# Patient Record
Sex: Female | Born: 1985 | Race: White | Hispanic: No | Marital: Married | Smoking: Current some day smoker
Health system: Southern US, Community
[De-identification: ages and names within clinical notes are randomized; demographics above are authoritative.]

## PROBLEM LIST (undated history)

## (undated) ENCOUNTER — Inpatient Hospital Stay (HOSPITAL_COMMUNITY): Payer: Self-pay

## (undated) DIAGNOSIS — F32A Depression, unspecified: Secondary | ICD-10-CM

## (undated) DIAGNOSIS — F329 Major depressive disorder, single episode, unspecified: Secondary | ICD-10-CM

## (undated) DIAGNOSIS — R519 Headache, unspecified: Secondary | ICD-10-CM

## (undated) DIAGNOSIS — G4730 Sleep apnea, unspecified: Secondary | ICD-10-CM

## (undated) DIAGNOSIS — M1990 Unspecified osteoarthritis, unspecified site: Secondary | ICD-10-CM

## (undated) DIAGNOSIS — N289 Disorder of kidney and ureter, unspecified: Secondary | ICD-10-CM

## (undated) DIAGNOSIS — J45909 Unspecified asthma, uncomplicated: Secondary | ICD-10-CM

## (undated) DIAGNOSIS — O24419 Gestational diabetes mellitus in pregnancy, unspecified control: Secondary | ICD-10-CM

## (undated) DIAGNOSIS — F419 Anxiety disorder, unspecified: Secondary | ICD-10-CM

## (undated) DIAGNOSIS — N390 Urinary tract infection, site not specified: Secondary | ICD-10-CM

## (undated) HISTORY — PX: ELBOW SURGERY: SHX618

## (undated) HISTORY — DX: Gestational diabetes mellitus in pregnancy, unspecified control: O24.419

## (undated) HISTORY — PX: TUBAL LIGATION: SHX77

## (undated) HISTORY — PX: CHOLECYSTECTOMY: SHX55

---
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
## 2007-01-24 ENCOUNTER — Inpatient Hospital Stay (HOSPITAL_COMMUNITY): Admission: AD | Admit: 2007-01-24 | Discharge: 2007-01-24 | Payer: Self-pay | Admitting: Obstetrics and Gynecology

## 2007-01-30 ENCOUNTER — Ambulatory Visit: Payer: Self-pay | Admitting: *Deleted

## 2007-01-30 ENCOUNTER — Inpatient Hospital Stay (HOSPITAL_COMMUNITY): Admission: AD | Admit: 2007-01-30 | Discharge: 2007-01-30 | Payer: Self-pay | Admitting: Obstetrics and Gynecology

## 2007-02-25 ENCOUNTER — Ambulatory Visit: Payer: Self-pay | Admitting: Gynecology

## 2007-02-25 ENCOUNTER — Inpatient Hospital Stay (HOSPITAL_COMMUNITY): Admission: AD | Admit: 2007-02-25 | Discharge: 2007-02-25 | Payer: Self-pay | Admitting: Gynecology

## 2007-02-26 ENCOUNTER — Inpatient Hospital Stay (HOSPITAL_COMMUNITY): Admission: AD | Admit: 2007-02-26 | Discharge: 2007-02-27 | Payer: Self-pay | Admitting: Obstetrics and Gynecology

## 2007-02-26 ENCOUNTER — Ambulatory Visit: Payer: Self-pay | Admitting: *Deleted

## 2009-01-30 ENCOUNTER — Emergency Department (HOSPITAL_COMMUNITY): Admission: EM | Admit: 2009-01-30 | Discharge: 2009-01-30 | Payer: Self-pay | Admitting: Emergency Medicine

## 2010-04-17 ENCOUNTER — Emergency Department (HOSPITAL_COMMUNITY): Admission: EM | Admit: 2010-04-17 | Discharge: 2010-04-17 | Payer: Self-pay | Admitting: Emergency Medicine

## 2010-08-01 ENCOUNTER — Encounter: Payer: Self-pay | Admitting: Orthopaedic Surgery

## 2010-10-17 LAB — URINALYSIS, ROUTINE W REFLEX MICROSCOPIC
Bilirubin Urine: NEGATIVE
Glucose, UA: NEGATIVE mg/dL
Hgb urine dipstick: NEGATIVE
Ketones, ur: NEGATIVE mg/dL
Nitrite: NEGATIVE
Protein, ur: NEGATIVE mg/dL
Specific Gravity, Urine: 1.01 (ref 1.005–1.030)
Urobilinogen, UA: 0.2 mg/dL (ref 0.0–1.0)
pH: 7 (ref 5.0–8.0)

## 2010-10-17 LAB — PREGNANCY, URINE: Preg Test, Ur: NEGATIVE

## 2010-10-17 LAB — GLUCOSE, CAPILLARY: Glucose-Capillary: 93 mg/dL (ref 70–99)

## 2010-11-24 ENCOUNTER — Emergency Department (HOSPITAL_COMMUNITY): Payer: PRIVATE HEALTH INSURANCE

## 2010-11-24 ENCOUNTER — Emergency Department (HOSPITAL_COMMUNITY)
Admission: EM | Admit: 2010-11-24 | Discharge: 2010-11-24 | Disposition: A | Discharge status: Home / Self Care | Payer: PRIVATE HEALTH INSURANCE | Attending: Emergency Medicine | Admitting: Emergency Medicine

## 2010-11-24 DIAGNOSIS — R51 Headache: Secondary | ICD-10-CM | POA: Insufficient documentation

## 2010-11-24 DIAGNOSIS — J45909 Unspecified asthma, uncomplicated: Secondary | ICD-10-CM | POA: Insufficient documentation

## 2010-11-26 NOTE — Op Note (Signed)
   NAME:  [name], [name]                       ACCOUNT NO.:  [id number]   MEDICAL RECORD NO.:  [id number]                   PATIENT TYPE:  INP   LOCATION:  A418                                 FACILITY:  [facility]   PHYSICIAN:  Lazaro Arms, M.D.                DATE OF BIRTH:  16-Feb-1986   DATE OF PROCEDURE:  DATE OF DISCHARGE:                                 OPERATIVE REPORT   EPIDURAL NOTE   The patient is a 25 year old gravida 1 in active phase of labor, 4 cm, who  has had a dose of Nubain and Phenergan who is requesting an epidural  placement for pain management.   DESCRIPTION OF PROCEDURE:  The patient is placed in the sitting position.  The L2-L3 interspace identified.  The area is prepped and draped.  Lidocaine  1% is used as a local anesthetic. A 17-gauge Touhy needle was placed into  the epidural space with 1 pass without difficulty.  Ten cc of 0.125%  bupivacaine plain was given as a test dose without ill effect. The epidural  catheter was then fitting into the epidural space easily.  It was taped down  5 cm into the space, 12.5 cm at the skin.  An addition 10 cc of 0.125%  bupivacaine was given and the patient had no ill effects, became comfortable  with a reactive NST.  A continuous pump of 0.125% bupivacaine with 2 mcg/cc  of Fentanyl was placed at 12 cc an hour.  Blood pressure was stable. No ill  effects.      ___________________________________________                                            Lazaro Arms, M.D.   LHE/MEDQ  D:  05/17/2003  T:  05/18/2003  Job:  [id number]

## 2010-11-26 NOTE — Discharge Summary (Signed)
NAME:  Brooke Espinoza, Brooke Espinoza                       ACCOUNT NO.:  [id number]   MEDICAL RECORD NO.:  [id number]                   PATIENT TYPE:  OIB   LOCATION:  A415                                 FACILITY:  [facility]   PHYSICIAN:  Langley Gauss, M.D.                DATE OF BIRTH:  1985-11-28   DATE OF ADMISSION:  03/23/2003  DATE OF DISCHARGE:  03/23/2003                                 DISCHARGE SUMMARY   HISTORY OF PRESENT ILLNESS:  The patient is a 25 year old, G1, P0 at 29-2/7  weeks' gestation who presents to [facility] complaining of uterine  contractions since 1330 hours today.  The patient states that she has had  multiple previous episodes of Braxton-Hicks contractions.  These were  different in that she felt they were stronger and more frequent than  typical.  The patient's prenatal course is otherwise uncomplicated.   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   PAST MEDICAL HISTORY:  History of asthma for which she uses an albuterol  inhaler on a p.r.n. basis.   REVIEW OF SYMPTOMS:  She denies any change in cervical mucus, any vaginal  bleeding or leakage of fluid.   PRENATAL COURSE:  Prenatal care provided at [facility].   PHYSICAL EXAMINATION:  GENERAL:  In no acute distress.  VITAL SIGNS:  Temperature 97.9, pulse 82, respirations 16, blood pressure  130/68.  ABDOMEN:  Uterus soft, nontender.  Fundal height 30 cm.  Vertex presentation  by Leopold's maneuvers.  PELVIC:  Normal external genitalia.  No lesions or ulcerations identified.  Cervix noted to be closed.  Presenting part is not engaged.  Cervix is firm  and 3 cm long.  External fetal monitoring reveals reassuring fetal heart  rate with baseline 150.  No decelerations noted.   HOSPITAL COURSE:  On initial monitor strip, there appears to be very mild  uterine activity occurring every five minutes.  For this reason, the patient  is treated with a single injection of  0.25 mg of subcutaneous terbutaline  with complete cessation of uterine activity for 1-1/2 hours duration.  I  reevaluated the patient at that time.  The cervix was noted to be closed and  she was advised that if the uterine contractions did not recur, she would be  discharged to home.  Nursing staff reports that right after I left the room,  she again had renewed onset of these somewhat atypical appearing uterine  contractions every five minutes, very mild in intensity.  She was treated  with a second 0.25 mg subcutaneous terbutaline.  Shortly thereafter, the  patient states that she was hungry and desires to eat.  Her boyfriend had  already left to get her some food.  The patient thereafter ate in the room  with no recurrence of uterine activity.  She was treated with 2.5 mg of p.o.  terbutaline.  With no renewed uterine activity and cervix noted to be  closed, the  patient was discharged to home.  She is given 2.5 mg of subcutaneous  terbutaline to take with her to take in the morning.  A prescription is  written for terbutaline 2.5 mg p.o. q.6h. while awake.  In addition, on  tonight's date of visit she is given Ambien 10 mg p.o. x1 for therapeutic  rest.                                               Langley Gauss, M.D.    DC/MEDQ  D:  03/24/2003  T:  03/24/2003  Job:  [id number]   cc:   [facility]

## 2010-11-26 NOTE — Discharge Summary (Signed)
   NAME:  Brooke Espinoza, Brooke Espinoza                       ACCOUNT NO.:  [id number]   MEDICAL RECORD NO.:  [id number]                   PATIENT TYPE:  INP   LOCATION:  A418                                 FACILITY:  [facility]   PHYSICIAN:  Lazaro Arms, M.D.                DATE OF BIRTH:  [birth year]   DATE OF ADMISSION:  05/16/2003  DATE OF DISCHARGE:                                 DISCHARGE SUMMARY   HISTORY OF PRESENT ILLNESS:  Brooke Espinoza is a 25 year old white female, gravida  1, para 0, estimated date of delivery of June 07, 2003, at [redacted] weeks  gestation, who has been in prodromal labor for the last several days.  She  has finally progressed at 3-4 cm, 75% effaced, and a 1- station vertex.  Her  pregnancy has been uncomplicated.  She had one asthma attack.  She has a  history of asthma.  There is a reactive NST and the patient is contracting  every two to three minutes.   PAST MEDICAL HISTORY:  Asthma.   PAST SURGICAL HISTORY:  Negative.   PAST OBSTETRICAL HISTORY:  She is nulliparous.   ALLERGIES:  None.   MEDICATIONS:  Albuterol as needed.   SOCIAL HISTORY:  She is single and the father of the baby is supportive.   REVIEW OF SYSTEMS:  Otherwise negative.   LABORATORY DATA:  Blood type is A positive.  The antibody screen was  negative.  Serology was nonreactive.  Rubella was immune.  HIV was negative.  Hepatitis B was negative.  Pap was class I.  GC and chlamydia were negative.  AFP was normal.  Group B Streptococcus was negative.  Glucola was 128.   PHYSICAL EXAMINATION:  HEENT:  Unremarkable.  NECK:  The thyroid is normal.  LUNGS:  Clear.  HEART:  Regular rate and rhythm without murmur, rub, or gallop.  BREASTS:  Without mass, discharge, or skin changes.  ABDOMEN:  Fundal height of 39 cm.  PELVIC:  The cervix is 3-4, 75, and -1 station.  EXTREMITIES:  Warm with 1+ edema.    IMPRESSION:  1. Intrauterine pregnancy at [redacted] weeks gestation.  2. Early active labor after long  prodromal phase.   PLAN:  The patient is admitted for expectant management of normal  spontaneous vaginal delivery.     ___________________________________________                                         Lazaro Arms, M.D.   LHE/MEDQ  D:  05/20/2003  T:  05/20/2003  Job:  [id number]

## 2010-11-26 NOTE — Op Note (Signed)
   NAME:  Brooke Espinoza, Brooke Espinoza                       ACCOUNT NO.:  [id number]   MEDICAL RECORD NO.:  [id number]                   PATIENT TYPE:  INP   LOCATION:  A418                                 FACILITY:  [facility]   PHYSICIAN:  Lazaro Arms, M.D.                DATE OF BIRTH:  1986/04/07   DATE OF PROCEDURE:  DATE OF DISCHARGE:                                 OPERATIVE REPORT   DELIVERY NOTE   [name] is a 25 year old gravida 1 para 0 with a working epidural.  She was  found to be complete.  She began maternal expulsive efforts at approximately  2245 and over an intact perineum delivered a viable female infant at 2304 with  Apgars of 8 and 9, weighing 9 pounds and 1 ounces.  There is a 3-vessel  cord.  Cord blood and cord gas were sent.  The placenta was delivered and it  was intact. The uterus was firm.  Blood loss for the delivery was about 300  cc.   There was a first-degree laceration of the perineum that was repaired with  three 3-0 Monocryl sutures in the usual fashion without difficulty.  The  infant underwent routine neonatal resuscitation by 2 labor and delivery  nursery delivery nurses who were in attendance for the delivery.  The  patient tolerated the procedure well.  The epidural catheter was removed  intact.  She will undergo routine postpartum care.      ___________________________________________                                            Lazaro Arms, M.D.   LHE/MEDQ  D:  05/17/2003  T:  05/18/2003  Job:  [id number]

## 2011-04-22 LAB — CCBB MATERNAL DONOR DRAW

## 2011-04-22 LAB — CBC
HCT: 29.7 — ABNORMAL LOW
Hemoglobin: 10 — ABNORMAL LOW
Hemoglobin: 12.6
RBC: 3.72 — ABNORMAL LOW
RBC: 4.67
WBC: 11.5 — ABNORMAL HIGH

## 2011-11-29 ENCOUNTER — Ambulatory Visit: Payer: 59 | Attending: Orthopedic Surgery | Admitting: Physical Therapy

## 2011-11-29 DIAGNOSIS — R5381 Other malaise: Secondary | ICD-10-CM | POA: Insufficient documentation

## 2011-11-29 DIAGNOSIS — M25619 Stiffness of unspecified shoulder, not elsewhere classified: Secondary | ICD-10-CM | POA: Insufficient documentation

## 2011-11-29 DIAGNOSIS — IMO0001 Reserved for inherently not codable concepts without codable children: Secondary | ICD-10-CM | POA: Insufficient documentation

## 2011-11-29 DIAGNOSIS — M25519 Pain in unspecified shoulder: Secondary | ICD-10-CM | POA: Insufficient documentation

## 2011-12-01 ENCOUNTER — Encounter: Payer: 59 | Admitting: Physical Therapy

## 2011-12-02 ENCOUNTER — Ambulatory Visit: Payer: 59 | Admitting: Physical Therapy

## 2011-12-06 ENCOUNTER — Encounter: Payer: 59 | Admitting: Physical Therapy

## 2011-12-07 ENCOUNTER — Encounter: Payer: 59 | Admitting: Physical Therapy

## 2011-12-08 ENCOUNTER — Ambulatory Visit: Payer: 59 | Admitting: Physical Therapy

## 2011-12-13 ENCOUNTER — Encounter: Payer: 59 | Admitting: Physical Therapy

## 2011-12-16 ENCOUNTER — Encounter: Payer: 59 | Admitting: *Deleted

## 2012-04-05 LAB — OB RESULTS CONSOLE HGB/HCT, BLOOD: HCT: 40 %

## 2012-04-05 LAB — OB RESULTS CONSOLE ABO/RH: RH Type: POSITIVE

## 2012-04-05 LAB — OB RESULTS CONSOLE RUBELLA ANTIBODY, IGM: Rubella: IMMUNE

## 2012-04-05 LAB — OB RESULTS CONSOLE HIV ANTIBODY (ROUTINE TESTING): HIV: NONREACTIVE

## 2012-04-30 ENCOUNTER — Other Ambulatory Visit (HOSPITAL_COMMUNITY)
Admission: RE | Admit: 2012-04-30 | Discharge: 2012-04-30 | Disposition: A | Discharge status: Home / Self Care | Payer: PRIVATE HEALTH INSURANCE | Source: Ambulatory Visit | Attending: Obstetrics and Gynecology | Admitting: Obstetrics and Gynecology

## 2012-04-30 DIAGNOSIS — Z113 Encounter for screening for infections with a predominantly sexual mode of transmission: Secondary | ICD-10-CM | POA: Insufficient documentation

## 2012-04-30 DIAGNOSIS — Z01419 Encounter for gynecological examination (general) (routine) without abnormal findings: Secondary | ICD-10-CM | POA: Insufficient documentation

## 2012-04-30 LAB — OB RESULTS CONSOLE GC/CHLAMYDIA: Gonorrhea: NEGATIVE

## 2012-05-31 ENCOUNTER — Encounter (HOSPITAL_COMMUNITY): Payer: Self-pay | Admitting: *Deleted

## 2012-05-31 ENCOUNTER — Emergency Department (HOSPITAL_COMMUNITY)
Admission: EM | Admit: 2012-05-31 | Discharge: 2012-05-31 | Discharge status: Against Medical Advice | Payer: PRIVATE HEALTH INSURANCE | Attending: Emergency Medicine | Admitting: Emergency Medicine

## 2012-05-31 DIAGNOSIS — B349 Viral infection, unspecified: Secondary | ICD-10-CM

## 2012-05-31 DIAGNOSIS — R059 Cough, unspecified: Secondary | ICD-10-CM | POA: Insufficient documentation

## 2012-05-31 DIAGNOSIS — B338 Other specified viral diseases: Secondary | ICD-10-CM | POA: Insufficient documentation

## 2012-05-31 DIAGNOSIS — R05 Cough: Secondary | ICD-10-CM | POA: Insufficient documentation

## 2012-05-31 DIAGNOSIS — O98519 Other viral diseases complicating pregnancy, unspecified trimester: Secondary | ICD-10-CM | POA: Insufficient documentation

## 2012-05-31 DIAGNOSIS — J029 Acute pharyngitis, unspecified: Secondary | ICD-10-CM | POA: Insufficient documentation

## 2012-05-31 DIAGNOSIS — R509 Fever, unspecified: Secondary | ICD-10-CM | POA: Insufficient documentation

## 2012-05-31 MED ORDER — ONDANSETRON 8 MG PO TBDP
8.0000 mg | ORAL_TABLET | Freq: Once | ORAL | Status: AC
  Administered 2012-05-31: 8 mg via ORAL
  Filled 2012-05-31: qty 1

## 2012-05-31 MED ORDER — ALBUTEROL SULFATE HFA 108 (90 BASE) MCG/ACT IN AERS
2.0000 | INHALATION_SPRAY | RESPIRATORY_TRACT | Status: DC | PRN
  Administered 2012-05-31: 2 via RESPIRATORY_TRACT
  Filled 2012-05-31: qty 6.7

## 2012-05-31 MED ORDER — ACETAMINOPHEN 325 MG PO TABS
650.0000 mg | ORAL_TABLET | Freq: Once | ORAL | Status: AC
  Administered 2012-05-31: 650 mg via ORAL
  Filled 2012-05-31: qty 2

## 2012-05-31 NOTE — ED Notes (Addendum)
Pt presents with productive cough, green sputum, chills, body aches, Nausea and diarrhea x 1 week per pt. Pt reports having a flu shot in October.  Pt denies emesis at this time. Pt is afebrile at this time. Denies abdominal pain. Pt states is [redacted] weeks pregnant.

## 2012-05-31 NOTE — ED Provider Notes (Signed)
History     CSN: [id number]  Arrival date & time 05/31/12  1318   First MD Initiated Contact with Patient 05/31/12 1324      Chief Complaint  Patient presents with  . Cough  . Fever    Patient is a 26 y.o. female presenting with cough. The history is provided by the patient.  Cough This is a new problem. The current episode started more than 2 days ago. The problem occurs every few minutes. The problem has been gradually worsening. The cough is productive of sputum. Associated symptoms include chills and sore throat. She has tried decongestants for the symptoms. The treatment provided no relief. She is not a smoker.   Pt presents for cough, congestion, sore throat for past 7 days.  She reports when she takes deep breath she has coughing.  She reports greenish sputum, but no hemoptysis reported.  She also reports myalgias.  She reports chest wall pain with cough . She reports she feels SOB with deep breathing.  No vomiting but she reports diarrhea and loose stool (nonbloody) for past several days.  No abd pain.  No vag bleeding. She is currently [redacted] weeks pregnant, she has had prenatal care and no complications thus far. Past Medical History  Diagnosis Date  . Pregnant     History reviewed. No pertinent past surgical history.  History reviewed. No pertinent family history.  History  Substance Use Topics  . Smoking status: Never Smoker   . Smokeless tobacco: Not on file  . Alcohol Use: No    OB History    Grav Para Term Preterm Abortions TAB SAB Ect Mult Living   1               Review of Systems  Constitutional: Positive for chills.  HENT: Positive for sore throat.   All other systems reviewed and are negative.    Allergies  Review of patient's allergies indicates no known allergies.  Home Medications  No current outpatient prescriptions on file.  BP 126/76  Pulse 85  Temp 98 F (36.7 C) (Oral)  Resp 20  Ht 5' 6.5" (1.689 m)  Wt 218 lb (98.884 kg)  BMI  34.66 kg/m2  SpO2 99%  Physical Exam CONSTITUTIONAL: Well developed/well nourished HEAD AND FACE: Normocephalic/atraumatic EYES: EOMI/PERRL ENMT: Mucous membranes moist, nasal congestion, uvula midline, pharynx normal NECK: supple no meningeal signs SPINE:entire spine nontender CV: S1/S2 noted, no murmurs/rubs/gallops noted LUNGS: Lungs are clear to auscultation bilaterally, no apparent distress Chest - tender to palpation, no crepitance noted ABDOMEN: soft, nontender, no rebound or guarding GU:no cva tenderness NEURO: Pt is awake/alert, moves all extremitiesx4 EXTREMITIES: pulses normal, full ROM SKIN: warm, color normal PSYCH: no abnormalities of mood noted  ED Course  Procedures   1:55 PM Pt here with cough/congestion and diarrhea.  Likely viral syndrome.  Do not feel imaging warranted as lung sounds clear, afebrile and no hypoxia.  She walks around the ED in no distress.  I did offer tylenol/zofran and a dose of albuterol for her cough.  She will trial this here.  She does not want IV or any lab draw  MDM  Nursing notes including past medical history and social history reviewed and considered in documentation   Pt left before final disposition could be set       Joya Gaskins, MD 05/31/12 1455

## 2012-05-31 NOTE — ED Notes (Signed)
Pt cough, nasal congestion and fever x 1 week, cough productive at times and green in color

## 2012-05-31 NOTE — ED Notes (Signed)
Pt states that she is almost [redacted] weeks pregnant

## 2012-06-11 ENCOUNTER — Inpatient Hospital Stay (HOSPITAL_COMMUNITY)
Admission: AD | Admit: 2012-06-11 | Discharge: 2012-06-11 | Disposition: A | Discharge status: Home / Self Care | Payer: PRIVATE HEALTH INSURANCE | Source: Ambulatory Visit | Attending: Obstetrics & Gynecology | Admitting: Obstetrics & Gynecology

## 2012-06-11 ENCOUNTER — Encounter (HOSPITAL_COMMUNITY): Payer: Self-pay

## 2012-06-11 DIAGNOSIS — R109 Unspecified abdominal pain: Secondary | ICD-10-CM

## 2012-06-11 DIAGNOSIS — O26899 Other specified pregnancy related conditions, unspecified trimester: Secondary | ICD-10-CM

## 2012-06-11 DIAGNOSIS — O99891 Other specified diseases and conditions complicating pregnancy: Secondary | ICD-10-CM | POA: Insufficient documentation

## 2012-06-11 LAB — URINE MICROSCOPIC-ADD ON

## 2012-06-11 LAB — URINALYSIS, ROUTINE W REFLEX MICROSCOPIC
Bilirubin Urine: NEGATIVE
Glucose, UA: NEGATIVE mg/dL
Hgb urine dipstick: NEGATIVE
Nitrite: NEGATIVE
Specific Gravity, Urine: 1.02 (ref 1.005–1.030)
pH: 6.5 (ref 5.0–8.0)

## 2012-06-11 LAB — WET PREP, GENITAL: Yeast Wet Prep HPF POC: NONE SEEN

## 2012-06-11 NOTE — MAU Note (Signed)
Patient states she has been having abdominal cramping today and passed a clot at 1630. No active bleeding. States some nausea with the cramping.

## 2012-06-11 NOTE — MAU Provider Note (Signed)
History     CSN: [id number]  Arrival date and time: 06/11/12 1754   None     Chief Complaint  Patient presents with  . Abdominal Pain  . Vaginal Bleeding   HPI 26 y.o. [id number] at [redacted]w[redacted]d with c/o contraction type pain starting today, states pains are about 3-5 minutes apart, last about 1 min each and "feel like everything is pushing down". She states she passed a blood clot today, no other discharge or bleeding. Prenatal care at [facility], states she had an ultrasound and that "everything is where it's supposed to be". Pt states blood type is A pos.   Past Medical History  Diagnosis Date  . Pregnant     History reviewed. No pertinent past surgical history.  History reviewed. No pertinent family history.  History  Substance Use Topics  . Smoking status: Never Smoker   . Smokeless tobacco: Not on file  . Alcohol Use: No    Allergies: No Known Allergies  Prescriptions prior to admission  Medication Sig Dispense Refill  . flintstones complete (FLINTSTONES) 60 MG chewable tablet Chew 2 tablets by mouth daily.      Marland Kitchen KETOCONAZOLE-CLEANSER EX Apply 1 application topically daily.        Review of Systems  Constitutional: Negative.   Respiratory: Negative.   Cardiovascular: Negative.   Gastrointestinal: Positive for abdominal pain. Negative for nausea, vomiting, diarrhea and constipation.  Genitourinary: Negative for dysuria, urgency, frequency, hematuria and flank pain.       Positive for vaginal bleeding   Musculoskeletal: Negative.   Neurological: Negative.   Psychiatric/Behavioral: Negative.    Physical Exam   Blood pressure 78/49, pulse 90, temperature 97.6 F (36.4 C), temperature source Oral, resp. rate 20, height 5\' 6"  (1.676 m), weight 218 lb 3.2 oz (98.975 kg), SpO2 97.00%.  Physical Exam  Nursing note and vitals reviewed. Constitutional: She is oriented to person, place, and time. She appears well-developed and well-nourished. No distress.  HENT:    Head: Normocephalic and atraumatic.  Cardiovascular: Normal rate and regular rhythm.   Respiratory: Effort normal. No respiratory distress.  GI: Soft. She exhibits no distension and no mass. There is no tenderness. There is no rebound and no guarding.  Genitourinary: There is no rash or lesion on the right labia. There is no rash or lesion on the left labia. Uterus is not tender. Cervix exhibits no motion tenderness, no discharge and no friability. No erythema, tenderness or bleeding (no evidence of vaginal bleeding) around the vagina. Vaginal discharge (creamy white) found.       SVE: closed/thick/high  Neurological: She is alert and oriented to person, place, and time.  Skin: Skin is warm and dry.  Psychiatric: She has a normal mood and affect.   + FHR 150s MAU Course  Procedures  Results for orders placed during the hospital encounter of 06/11/12 (from the past 72 hour(s))  URINALYSIS, ROUTINE W REFLEX MICROSCOPIC     Status: Abnormal   Collection Time   06/11/12  6:15 PM      Component Value Range Comment   Color, Urine YELLOW  YELLOW    APPearance CLEAR  CLEAR    Specific Gravity, Urine 1.020  1.005 - 1.030    pH 6.5  5.0 - 8.0    Glucose, UA NEGATIVE  NEGATIVE mg/dL    Hgb urine dipstick NEGATIVE  NEGATIVE    Bilirubin Urine NEGATIVE  NEGATIVE    Ketones, ur NEGATIVE  NEGATIVE mg/dL  Protein, ur NEGATIVE  NEGATIVE mg/dL    Urobilinogen, UA 0.2  0.0 - 1.0 mg/dL    Nitrite NEGATIVE  NEGATIVE    Leukocytes, UA SMALL (*) NEGATIVE   URINE MICROSCOPIC-ADD ON     Status: Abnormal   Collection Time   06/11/12  6:15 PM      Component Value Range Comment   Squamous Epithelial / LPF FEW (*) RARE    WBC, UA 3-6  <3 WBC/hpf    Bacteria, UA RARE  RARE   WET PREP, GENITAL     Status: Abnormal   Collection Time   06/11/12  6:50 PM      Component Value Range Comment   Yeast Wet Prep HPF POC NONE SEEN  NONE SEEN    Trich, Wet Prep NONE SEEN  NONE SEEN    Clue Cells Wet Prep HPF POC  NONE SEEN  NONE SEEN    WBC, Wet Prep HPF POC MANY (*) NONE SEEN MANY BACTERIA SEEN     Assessment and Plan   1. Abdominal pain in pregnancy   No evidence of bleeding at this time, cervix closed, urine culture sent, but UA shows small leuk only, will treat for UTI if indicated by culture.     Medication List     As of 06/11/2012  7:23 PM    CONTINUE taking these medications         flintstones complete 60 MG chewable tablet      KETOCONAZOLE-CLEANSER EX         Follow-up Information    Follow up with [facility]. (as scheduled or sooner as needed)    Contact information:   [address] [place] [address] [phone]           FRAZIER,NATALIE 06/11/2012, 7:23 PM

## 2012-06-12 LAB — GC/CHLAMYDIA PROBE AMP: GC Probe RNA: NEGATIVE

## 2012-07-11 NOTE — L&D Delivery Note (Signed)
Delivery Note At 9:20 PM a viable female was delivered via Vaginal, Spontaneous Delivery (Presentation: ;  ).  APGAR: 9, 9; weight .   Placenta status: Intact, Spontaneous.  Cord: 3 vessels with the following complications: tight nuchal cord x2 clamped and cut on perineum.   Anesthesia: Epidural  Episiotomy: None Lacerations: None Suture Repair: n/a Est. Blood Loss (mL): 400cc  Mom to postpartum.  Baby to nursery-stable.  HARRAWAY-SMITH, Gabor Lusk 11/24/2012, 9:39 PM

## 2012-09-03 ENCOUNTER — Encounter: Payer: Self-pay | Admitting: *Deleted

## 2012-09-05 ENCOUNTER — Inpatient Hospital Stay (HOSPITAL_COMMUNITY)
Admission: AD | Admit: 2012-09-05 | Discharge: 2012-09-05 | Disposition: A | Discharge status: Home / Self Care | Payer: PRIVATE HEALTH INSURANCE | Source: Ambulatory Visit | Attending: Obstetrics & Gynecology | Admitting: Obstetrics & Gynecology

## 2012-09-05 ENCOUNTER — Encounter (HOSPITAL_COMMUNITY): Payer: Self-pay

## 2012-09-05 DIAGNOSIS — R42 Dizziness and giddiness: Secondary | ICD-10-CM | POA: Insufficient documentation

## 2012-09-05 DIAGNOSIS — R1011 Right upper quadrant pain: Secondary | ICD-10-CM | POA: Insufficient documentation

## 2012-09-05 DIAGNOSIS — O169 Unspecified maternal hypertension, unspecified trimester: Secondary | ICD-10-CM

## 2012-09-05 DIAGNOSIS — O139 Gestational [pregnancy-induced] hypertension without significant proteinuria, unspecified trimester: Secondary | ICD-10-CM

## 2012-09-05 DIAGNOSIS — R51 Headache: Secondary | ICD-10-CM

## 2012-09-05 LAB — COMPREHENSIVE METABOLIC PANEL
ALT: 8 U/L (ref 0–35)
AST: 9 U/L (ref 0–37)
Albumin: 2.3 g/dL — ABNORMAL LOW (ref 3.5–5.2)
Alkaline Phosphatase: 62 U/L (ref 39–117)
Chloride: 102 mEq/L (ref 96–112)
Potassium: 4.4 mEq/L (ref 3.5–5.1)
Sodium: 136 mEq/L (ref 135–145)
Total Bilirubin: 0.1 mg/dL — ABNORMAL LOW (ref 0.3–1.2)
Total Protein: 5.9 g/dL — ABNORMAL LOW (ref 6.0–8.3)

## 2012-09-05 LAB — CBC
HCT: 33 % — ABNORMAL LOW (ref 36.0–46.0)
MCH: 28.2 pg (ref 26.0–34.0)
MCHC: 33.6 g/dL (ref 30.0–36.0)
MCV: 84 fL (ref 78.0–100.0)
Platelets: 223 10*3/uL (ref 150–400)
RDW: 14.1 % (ref 11.5–15.5)
WBC: 10.1 10*3/uL (ref 4.0–10.5)

## 2012-09-05 LAB — PROTEIN / CREATININE RATIO, URINE: Protein Creatinine Ratio: 0.09 (ref 0.00–0.15)

## 2012-09-05 NOTE — MAU Note (Signed)
Pt reports she had "a spike in my blood pressure" earlier tonight. Pt reports she had elevated pressure in office on Monday and is currently doing a 24 hour urine. Reports headache and dizziness and pain in RUQ

## 2012-09-05 NOTE — MAU Provider Note (Signed)
History     CSN: [id number]  Arrival date and time: 09/05/12 4098   First Provider Initiated Contact with Patient 09/05/12 0257      Chief Complaint  Patient presents with  . Headache   HPI 27 y/o [id number] here with headache, scotoma, dizziness, and RUQ pain and [facility] evaluation [facility]. She states that her headache started 5 days ago and she was seen at her primary OB's office 2 days ago where they started a 24 hr urine collection and tyold her that her BP was high. She checks her own BP with a sphygmo at home, she has prior EMT training, and had a recurrence of her symptoms tonight at which time her BP was elevated to as high as 170/105. Her headache is frontal, in a band-like distribution and got acutely worse tonight when her BP was elevated. She has had scotoma intermittently for the last 5 days, as well as some mild dyspnea and dyspnea during her episodes. She was so dizzy on Sunday she fell and hit her face, she saw her OB the following day.Tonight she has developed RUQ pain described as similar to heartburn but not really heartburn. She states this has been an overall uneventful pregnancy and that she does not have any other medical problems. She denies fevers, chest pain, constipation, dysuria, and swelling. She denies vaginal bleeding, LOF, discharge, and contractions. The baby seemed to be moving less earlier tonight but is now moving normally.   She denies significant headache Hx but has had a migraine before that this is unlike.  OB History   Grav Para Term Preterm Abortions TAB SAB Ect Mult Living   4 2 2  0 1 0 0 1 0 2      Past Medical History  Diagnosis Date  . Pregnant     History reviewed. No pertinent past surgical history.  Family History  Problem Relation Age of Onset  . Hypertension Mother   . Thyroid disease Mother   . Hypertension Father   . Heart disease Maternal Grandmother   . Diabetes Maternal Grandmother   . Mental illness  Maternal Grandmother   . Hypertension Maternal Grandmother     History  Substance Use Topics  . Smoking status: Never Smoker   . Smokeless tobacco: Not on file  . Alcohol Use: No    Allergies: No Known Allergies  Prescriptions prior to admission  Medication Sig Dispense Refill  . flintstones complete (FLINTSTONES) 60 MG chewable tablet Chew 2 tablets by mouth daily.      Marland Kitchen KETOCONAZOLE-CLEANSER EX Apply 1 application topically daily.      . Doxylamine-Pyridoxine (DICLEGIS PO) Take 10 mg by mouth. 2 tablets at hs        ROS Physical Exam   Blood pressure 121/60, pulse 92, temperature 98.2 F (36.8 C), temperature source Oral, resp. rate 18, height 5\' 5"  (1.651 m), weight 104.781 kg (231 lb), last menstrual period 02/08/2012, SpO2 100.00%.  Physical Exam Gen: NAD, alert, cooperative with exam HEENT: NCAT CV: RRR, good S1/S2, no murmur Resp: CTABL, no wheezes, non-labored Abd: Soft pregnant abdomen, mild tenderness to palpation of RUQ Ext: No edema, warm, 2+ DP pulses Neuro: Alert and oriented, No gross deficits  FHT: Baselin 145, moderate variability, accels present, decels absent Toco: No contractions seen.    MAU Course  Procedures Results for orders placed during the hospital encounter of 09/05/12 (from the past 24 hour(s))  COMPREHENSIVE METABOLIC PANEL     Status:  Abnormal   Collection Time    09/05/12  3:05 AM      Result Value Range   Sodium 136  135 - 145 mEq/L   Potassium 4.4  3.5 - 5.1 mEq/L   Chloride 102  96 - 112 mEq/L   CO2 23  19 - 32 mEq/L   Glucose, Bld 78  70 - 99 mg/dL   BUN 9  6 - 23 mg/dL   Creatinine, Ser 9.14 (*) 0.50 - 1.10 mg/dL   Calcium 8.6  8.4 - 78.2 mg/dL   Total Protein 5.9 (*) 6.0 - 8.3 g/dL   Albumin 2.3 (*) 3.5 - 5.2 g/dL   AST 9  0 - 37 U/L   ALT 8  0 - 35 U/L   Alkaline Phosphatase 62  39 - 117 U/L   Total Bilirubin 0.1 (*) 0.3 - 1.2 mg/dL   GFR calc non Af Amer >90  >90 mL/min   GFR calc Af Amer >90  >90 mL/min  CBC      Status: Abnormal   Collection Time    09/05/12  3:05 AM      Result Value Range   WBC 10.1  4.0 - 10.5 K/uL   RBC 3.93  3.87 - 5.11 MIL/uL   Hemoglobin 11.1 (*) 12.0 - 15.0 g/dL   HCT 95.6 (*) 21.3 - 08.6 %   MCV 84.0  78.0 - 100.0 fL   MCH 28.2  26.0 - 34.0 pg   MCHC 33.6  30.0 - 36.0 g/dL   RDW 57.8  46.9 - 62.9 %   Platelets 223  150 - 400 K/uL  PROTEIN / CREATININE RATIO, URINE     Status: None   Collection Time    09/05/12  3:50 AM      Result Value Range   Creatinine, Urine 90.05     Total Protein, Urine 8     PROTEIN CREATININE RATIO 0.09  0.00 - 0.15     Assessment and Plan  28 y/o [id number] here with HA and HTN being evaluated for pre-eclampsia - CBC, CMP and Urine protein cre ratio WNL - BP 108-127/54-71 since presentation - Headache and dizziness improved without meds - Category 1 fetal strip - dc home with close f/u with her primary OB, will see them tomorrow for previously scheduled appt.    Kevin Fenton 09/05/2012, 6:19 AM   I have seen and examined this patient and I agree with the above. [name] 9:29 AM 09/05/2012

## 2012-09-05 NOTE — MAU Provider Note (Signed)
Attestation of Attending Supervision of Advanced Practitioner (CNM/NP): Evaluation and management procedures were performed by the Advanced Practitioner under my supervision and collaboration.  I have reviewed the Advanced Practitioner's note and chart, and I agree with the management and plan.  HARRAWAY-SMITH, Loda Bialas 5:04 PM     

## 2012-09-12 ENCOUNTER — Encounter: Payer: PRIVATE HEALTH INSURANCE | Attending: Obstetrics and Gynecology | Admitting: *Deleted

## 2012-09-12 ENCOUNTER — Encounter: Payer: Self-pay | Admitting: *Deleted

## 2012-09-12 DIAGNOSIS — Z713 Dietary counseling and surveillance: Secondary | ICD-10-CM | POA: Insufficient documentation

## 2012-09-12 DIAGNOSIS — O99810 Abnormal glucose complicating pregnancy: Secondary | ICD-10-CM | POA: Insufficient documentation

## 2012-09-12 NOTE — Progress Notes (Signed)
  Patient was seen on 09/12/2012 for Gestational Diabetes self-management class at the [facility]. The following learning objectives were met by the patient during this course:   States the definition of Gestational Diabetes  States why dietary management is important in controlling blood glucose  Describes the effects each nutrient has on blood glucose levels  Demonstrates ability to create a balanced meal plan  Demonstrates carbohydrate counting   States when to check blood glucose levels  Demonstrates proper blood glucose monitoring techniques  States the effect of stress and exercise on blood glucose levels  States the importance of limiting caffeine and abstaining from alcohol and smoking  Blood glucose monitor given: Accu Chek Nano BG Monitoring Kit Lot # [id number] Exp: 11/07/13 Blood glucose reading: 104 mg/dl  Patient instructed to monitor glucose levels: FBS: 60 - <90 2 hour: <120  *Patient received handouts:  Nutrition Diabetes and Pregnancy  Carbohydrate Counting List  Patient will be seen for follow-up as needed.

## 2012-09-12 NOTE — Patient Instructions (Signed)
Goals:  Check glucose levels per MD as instructed  Follow Gestational Diabetes Diet as instructed  Call for follow-up as needed    

## 2012-10-04 ENCOUNTER — Encounter: Payer: Self-pay | Admitting: Obstetrics and Gynecology

## 2012-10-04 ENCOUNTER — Encounter: Payer: Self-pay | Admitting: *Deleted

## 2012-10-15 ENCOUNTER — Telehealth: Payer: Self-pay | Admitting: Obstetrics and Gynecology

## 2012-10-15 NOTE — Telephone Encounter (Signed)
Pt states needs refill on Hydrocodone 5/325mg  Dr Emelda Fear had prescribed, continues to have "intense pelvic pain" Pt has an appt for Friday but would like to be seen sooner. An appt was made for tomorrow morning at 9:30 am with Dr. Despina Hidden. Pt told to discuss pain and pain meds with Dr. Despina Hidden tomorrow. Marland Kitchen

## 2012-10-16 ENCOUNTER — Encounter: Payer: Self-pay | Admitting: Obstetrics & Gynecology

## 2012-10-16 ENCOUNTER — Ambulatory Visit (INDEPENDENT_AMBULATORY_CARE_PROVIDER_SITE_OTHER): Payer: PRIVATE HEALTH INSURANCE | Admitting: Obstetrics & Gynecology

## 2012-10-16 VITALS — BP 110/70 | Wt 235.0 lb

## 2012-10-16 DIAGNOSIS — O99810 Abnormal glucose complicating pregnancy: Secondary | ICD-10-CM

## 2012-10-16 DIAGNOSIS — Z3483 Encounter for supervision of other normal pregnancy, third trimester: Secondary | ICD-10-CM

## 2012-10-16 LAB — POCT URINALYSIS DIPSTICK
Blood, UA: NEGATIVE
Ketones, UA: NEGATIVE
Nitrite, UA: NEGATIVE

## 2012-10-16 NOTE — Progress Notes (Signed)
Pelvic pressure and pain for 3-4 days, says having contractions 8 or so an hour.  Cervix as LTC.  Recommend a pregnancy belt, details reviewed.  No bleeding no ROM, no other complaints.  Keep appt Friday.

## 2012-10-16 NOTE — Progress Notes (Signed)
235

## 2012-10-16 NOTE — Patient Instructions (Signed)
Gestational Diabetes Mellitus Gestational diabetes mellitus (GDM) is diabetes that occurs only during pregnancy. This happens when the body cannot properly handle the glucose (sugar) that increases in the blood after eating. During pregnancy, insulin resistance (reduced sensitivity to insulin) occurs because of the release of hormones from the placenta. Usually, the pancreas of pregnant women produces enough insulin to overcome the resistance that occurs. However, in gestational diabetes, the insulin is there but it does not work effectively. If the resistance is severe enough that the pancreas does not produce enough insulin, extra glucose builds up in the blood.  WHO IS AT RISK FOR DEVELOPING GESTATIONAL DIABETES?  Women with a history of diabetes in the family.  Women over age 25.  Women who are overweight.  Women in certain ethnic groups (Hispanic, African American, Native American, Asian and Pacific Islander). WHAT CAN HAPPEN TO THE BABY? If the mother's blood glucose is too high while she is pregnant, the extra sugar will travel through the umbilical cord to the baby. Some of the problems the baby may have are:  Large Baby - If the baby receives too much sugar, the baby will gain more weight. This may cause the baby to be too large to be born normally (vaginally) and a Cesarean section (C-section) may be needed.  Low Blood Glucose (hypoglycemia)  The baby makes extra insulin, in response to the extra sugar its gets from its mother. When the baby is born and no longer needs this extra insulin, the baby's blood glucose level may drop.  Jaundice (yellow coloring of the skin and eyes)  This is fairly common in babies. It is caused from a build-up of the chemical called bilirubin. This is rarely serious, but is seen more often in babies whose mothers had gestational diabetes. RISKS TO THE MOTHER Women who have had gestational diabetes may be at higher risk for some problems,  including:  Preeclampsia or toxemia, which includes problems with high blood pressure. Blood pressure and protein levels in the urine must be checked frequently.  Infections.  Cesarean section (C-section) for delivery.  Developing Type 2 diabetes later in life. About 30-50% will develop diabetes later, especially if obese. DIAGNOSIS  The hormones that cause insulin resistance are highest at about 24-28 weeks of pregnancy. If symptoms are experienced, they are much like symptoms you would normally expect during pregnancy.  GDM is often diagnosed using a two part method: 1. After 24-28 weeks of pregnancy, the woman drinks a glucose solution and takes a blood test. If the glucose level is high, a second test will be given. 2. Oral Glucose Tolerance Test (OGTT) which is 3 hours long  After not eating overnight, the blood glucose is checked. The woman drinks a glucose solution, and hourly blood glucose tests are taken. If the woman has risk factors for GDM, the caregiver may test earlier than 24 weeks of pregnancy. TREATMENT  Treatment of GDM is directed at keeping the mother's blood glucose level normal, and may include:  Meal planning.  Taking insulin or other medicine to control your blood glucose level.  Exercise.  Keeping a daily record of the foods you eat.  Blood glucose monitoring and keeping a record of your blood glucose levels.  May monitor ketone levels in the urine, although this is no longer considered necessary in most pregnancies. HOME CARE INSTRUCTIONS  While you are pregnant:  Follow your caregiver's advice regarding your prenatal appointments, meal planning, exercise, medicines, vitamins, blood and other tests, and physical   activities.  Keep a record of your meals, blood glucose tests, and the amount of insulin you are taking (if any). Show this to your caregiver at every prenatal visit.  If you have GDM, you may have problems with hypoglycemia (low blood glucose).  You may suspect this if you become suddenly dizzy, feel shaky, and/or weak. If you think this is happening and you have a glucose meter, try to test your blood glucose level. Follow your caregiver's advice for when and how to treat your low blood glucose. Generally, the 15:15 rule is followed: Treat by consuming 15 grams of carbohydrates, wait 15 minutes, and recheck blood glucose. Examples of 15 grams of carbohydrates are:  1 cup skim or low-fat milk.   cup juice.  3-4 glucose tablets.  5-6 hard candies.  1 small box raisins.   cup regular soda pop.  Practice good hygiene, to avoid infections.  Do not smoke. SEEK MEDICAL CARE IF:   You develop abnormal vaginal discharge, with or without itching.  You become weak and tired more than expected.  You seem to sweat a lot.  You have a sudden increase in weight, 5 pounds or more in one week.  You are losing weight, 3 pounds or more in a week.  Your blood glucose level is high, and you need instructions on what to do about it. SEEK IMMEDIATE MEDICAL CARE IF:   You develop a severe headache.  You faint or pass out.  You develop nausea and vomiting.  You become disoriented or confused.  You have a convulsion.  You develop vision problems.  You develop stomach pain.  You develop vaginal bleeding.  You develop uterine contractions.  You have leaking or a gush of fluid from the vagina. AFTER YOU HAVE THE BABY:  Go to all of your follow-up appointments, and have blood tests as advised by your caregiver.  Maintain a healthy lifestyle, to prevent diabetes in the future. This includes:  Following a healthy meal plan.  Controlling your weight.  Getting enough exercise and proper rest.  Do not smoke.  Breastfeed your baby if you can. This will lower the chance of you and your baby developing diabetes later in life. For more information about diabetes, go to the American Diabetes Association at:  www.americandiabetesassociation.org. For more information about gestational diabetes, go to the American Congress of Obstetricians and Gynecologists at: www.acog.org. Document Released: 10/03/2000 Document Revised: 09/19/2011 Document Reviewed: 04/27/2009 ExitCare Patient Information 2013 [employer].  

## 2012-10-19 ENCOUNTER — Encounter: Payer: Self-pay | Admitting: Obstetrics and Gynecology

## 2012-10-19 ENCOUNTER — Encounter: Payer: Self-pay | Admitting: Obstetrics & Gynecology

## 2012-10-19 ENCOUNTER — Encounter: Payer: Self-pay | Admitting: *Deleted

## 2012-10-25 ENCOUNTER — Ambulatory Visit (INDEPENDENT_AMBULATORY_CARE_PROVIDER_SITE_OTHER): Payer: PRIVATE HEALTH INSURANCE | Admitting: Obstetrics and Gynecology

## 2012-10-25 VITALS — BP 114/60 | Wt 237.2 lb

## 2012-10-25 DIAGNOSIS — IMO0002 Reserved for concepts with insufficient information to code with codable children: Secondary | ICD-10-CM

## 2012-10-25 DIAGNOSIS — O99810 Abnormal glucose complicating pregnancy: Secondary | ICD-10-CM

## 2012-10-25 DIAGNOSIS — O0990 Supervision of high risk pregnancy, unspecified, unspecified trimester: Secondary | ICD-10-CM

## 2012-10-25 DIAGNOSIS — O24420 Gestational diabetes mellitus in childbirth, diet controlled: Secondary | ICD-10-CM

## 2012-10-25 LAB — POCT URINALYSIS DIPSTICK
Glucose, UA: NEGATIVE
Nitrite, UA: NEGATIVE

## 2012-10-25 MED ORDER — GLYBURIDE 2.5 MG PO TABS: 2.5000 mg | ORAL_TABLET | Freq: Every day | ORAL | Status: DC

## 2012-10-25 NOTE — Progress Notes (Signed)
C/o pain with swelling in toes, ankles, and fingers, also c/o pelvic and lower abdominal pain and pressure. "believes she lost part of her mucous plug x 1 week" Prob: Gest DM A-1 (diet controlled):  CBG review x 2wk: fastings: 89-94(2 values108,141), 2hr PC's104-126, (2 values 132,136).  Hx macrosomic infant 9lb 11 oz.          Assess: borderline cbg control, pt complying at present. U/s next wk for efw scheduled  Will initiate glyburide 2.5 q am, begin NST biweekly next wk jvf

## 2012-10-25 NOTE — Patient Instructions (Signed)
Begin glyburide 2.5 in mornings.  We will begin NST's next monday

## 2012-10-26 ENCOUNTER — Encounter (HOSPITAL_COMMUNITY): Payer: Self-pay

## 2012-10-26 ENCOUNTER — Inpatient Hospital Stay (HOSPITAL_COMMUNITY)
Admission: AD | Admit: 2012-10-26 | Discharge: 2012-10-26 | Disposition: A | Discharge status: Home / Self Care | Payer: Medicaid Other | Source: Ambulatory Visit | Attending: Family Medicine | Admitting: Family Medicine

## 2012-10-26 DIAGNOSIS — O479 False labor, unspecified: Secondary | ICD-10-CM

## 2012-10-26 DIAGNOSIS — O4700 False labor before 37 completed weeks of gestation, unspecified trimester: Secondary | ICD-10-CM | POA: Insufficient documentation

## 2012-10-26 LAB — URINE MICROSCOPIC-ADD ON

## 2012-10-26 LAB — URINALYSIS, ROUTINE W REFLEX MICROSCOPIC
Glucose, UA: NEGATIVE mg/dL
Protein, ur: NEGATIVE mg/dL
Specific Gravity, Urine: 1.015 (ref 1.005–1.030)
Urobilinogen, UA: 1 mg/dL (ref 0.0–1.0)

## 2012-10-26 MED ORDER — ZOLPIDEM TARTRATE 5 MG PO TABS
5.0000 mg | ORAL_TABLET | Freq: Once | ORAL | Status: AC
  Administered 2012-10-26: 5 mg via ORAL
  Filled 2012-10-26: qty 1

## 2012-10-26 MED ORDER — OXYCODONE-ACETAMINOPHEN 5-325 MG PO TABS
1.0000 | ORAL_TABLET | Freq: Once | ORAL | Status: AC
  Administered 2012-10-26: 1 via ORAL
  Filled 2012-10-26: qty 1

## 2012-10-26 NOTE — MAU Note (Signed)
Pt states contractions began last night and got stronger today. States they are every 5-60minutes apart. Denies vaginal bleeding or leakingof fluid.

## 2012-10-26 NOTE — MAU Provider Note (Signed)
  History     CSN: [id number]  Arrival date and time: 10/26/12 2103   None     Chief Complaint  Patient presents with  . Labor Eval   HPI Ms Chesney is a [id number] at 34.6wks who presents for eval of ctx that have become stronger this evening. Denies leak or bldg. Reports +FM. No N/V/D or H/A. Her preg has been followed by [facility] and has been remarkable for A2DM.  OB History   Grav Para Term Preterm Abortions TAB SAB Ect Mult Living   4 2 2  0 1 0 0 1 0 2      Past Medical History  Diagnosis Date  . Pregnant   . Gestational diabetes     History reviewed. No pertinent past surgical history.  Family History  Problem Relation Age of Onset  . Hypertension Mother   . Mental illness Mother   . Hypertension Father   . Thyroid disease Father   . Heart disease Maternal Grandmother   . Diabetes Maternal Grandmother   . Hypertension Maternal Grandmother     History  Substance Use Topics  . Smoking status: Never Smoker   . Smokeless tobacco: Not on file  . Alcohol Use: No    Allergies: No Known Allergies  Prescriptions prior to admission  Medication Sig Dispense Refill  . glyBURIDE (DIABETA) 2.5 MG tablet Take 1 tablet (2.5 mg total) by mouth at bedtime.  30 tablet  2  . KETOCONAZOLE-CLEANSER EX Apply 1 application topically daily as needed (for itching).       . [DISCONTINUED] HYDROcodone-acetaminophen (NORCO/VICODIN) 5-325 MG per tablet Take 1 tablet by mouth every 6 (six) hours as needed for pain.        ROS Physical Exam   Blood pressure 119/77, pulse 120, resp. rate 18, last menstrual period 02/08/2012, SpO2 100.00%.  Physical Exam  Constitutional: She is oriented to person, place, and time. She appears well-developed.  HENT:  Head: Normocephalic.  Neck: Normal range of motion.  Cardiovascular: Normal rate.   Recheck HR 96  Respiratory: Effort normal.  GI:  FHR 130s + accels, no decels Toco: ctx irreg 5-8 mins  Genitourinary: Vagina normal.  Cx  post 1/thick/high; unchanged on exams 1+ hours apart  Musculoskeletal: Normal range of motion.  Neurological: She is alert and oriented to person, place, and time.  Skin: Skin is warm and dry.  Psychiatric: She has a normal mood and affect. Her behavior is normal. Thought content normal.   Urinalysis    Component Value Date/Time   COLORURINE YELLOW 10/26/2012 2216   APPEARANCEUR CLEAR 10/26/2012 2216   LABSPEC 1.015 10/26/2012 2216   PHURINE 7.5 10/26/2012 2216   GLUCOSEU NEGATIVE 10/26/2012 2216   HGBUR NEGATIVE 10/26/2012 2216   BILIRUBINUR NEGATIVE 10/26/2012 2216   KETONESUR NEGATIVE 10/26/2012 2216   PROTEINUR NEGATIVE 10/26/2012 2216   UROBILINOGEN 1.0 10/26/2012 2216   NITRITE NEGATIVE 10/26/2012 2216   NITRITE neg 10/25/2012 0945   LEUKOCYTESUR TRACE* 10/26/2012 2216     MAU Course  Procedures    Assessment and Plan  IUP at 34.6wks Braxton Hicks ctx  Observed 1+ hours without cx change: d/c home Given Ambien and Percocet #1 of each prior to d/c F/U as scheduled at [facility] on 4/21 or sooner with labor/ROM/bldg.  [name] 10/26/2012, 10:58 PM

## 2012-10-27 NOTE — MAU Provider Note (Signed)
Chart reviewed and agree with management and plan.  

## 2012-10-28 LAB — URINE CULTURE

## 2012-10-29 ENCOUNTER — Ambulatory Visit (INDEPENDENT_AMBULATORY_CARE_PROVIDER_SITE_OTHER): Payer: PRIVATE HEALTH INSURANCE | Admitting: Obstetrics and Gynecology

## 2012-10-29 ENCOUNTER — Encounter: Payer: Self-pay | Admitting: *Deleted

## 2012-10-29 VITALS — BP 130/68 | Wt 239.8 lb

## 2012-10-29 DIAGNOSIS — O24419 Gestational diabetes mellitus in pregnancy, unspecified control: Secondary | ICD-10-CM

## 2012-10-29 DIAGNOSIS — O99810 Abnormal glucose complicating pregnancy: Secondary | ICD-10-CM

## 2012-10-29 DIAGNOSIS — IMO0002 Reserved for concepts with insufficient information to code with codable children: Secondary | ICD-10-CM

## 2012-10-29 DIAGNOSIS — O0990 Supervision of high risk pregnancy, unspecified, unspecified trimester: Secondary | ICD-10-CM

## 2012-10-29 LAB — POCT URINALYSIS DIPSTICK
Leukocytes, UA: NEGATIVE
Nitrite, UA: NEGATIVE
Protein, UA: NEGATIVE

## 2012-10-29 NOTE — Progress Notes (Signed)
Went [facility] on Friday due to contractions 5-8 minutes apart, c/o pain lower abdomen and pelvic rated at 4 on 1-10 scale.

## 2012-10-29 NOTE — Progress Notes (Signed)
Prob: Gest DM A-2 on Glyburide 2.5 q am. CBG's reported as 89-98 fasting, 2hr PC's 118-130.  CBGrecords not with pt.  NST today.  Prob 2: Symphysis diastasis pain. On HC 5/325 prn.  Using Abd belt.

## 2012-10-29 NOTE — Patient Instructions (Addendum)
Continue current glyburide dose Bring glucose record thursday

## 2012-11-02 ENCOUNTER — Encounter: Payer: PRIVATE HEALTH INSURANCE | Admitting: Obstetrics & Gynecology

## 2012-11-02 ENCOUNTER — Ambulatory Visit (INDEPENDENT_AMBULATORY_CARE_PROVIDER_SITE_OTHER): Payer: PRIVATE HEALTH INSURANCE | Admitting: Obstetrics and Gynecology

## 2012-11-02 ENCOUNTER — Other Ambulatory Visit: Payer: Self-pay | Admitting: Obstetrics & Gynecology

## 2012-11-02 ENCOUNTER — Encounter: Payer: Self-pay | Admitting: Obstetrics and Gynecology

## 2012-11-02 ENCOUNTER — Ambulatory Visit (INDEPENDENT_AMBULATORY_CARE_PROVIDER_SITE_OTHER): Payer: PRIVATE HEALTH INSURANCE

## 2012-11-02 VITALS — BP 130/76 | Wt 240.0 lb

## 2012-11-02 DIAGNOSIS — O09219 Supervision of pregnancy with history of pre-term labor, unspecified trimester: Secondary | ICD-10-CM

## 2012-11-02 DIAGNOSIS — O3660X0 Maternal care for excessive fetal growth, unspecified trimester, not applicable or unspecified: Secondary | ICD-10-CM

## 2012-11-02 DIAGNOSIS — O24913 Unspecified diabetes mellitus in pregnancy, third trimester: Secondary | ICD-10-CM

## 2012-11-02 DIAGNOSIS — O99810 Abnormal glucose complicating pregnancy: Secondary | ICD-10-CM

## 2012-11-02 DIAGNOSIS — O3663X1 Maternal care for excessive fetal growth, third trimester, fetus 1: Secondary | ICD-10-CM

## 2012-11-02 DIAGNOSIS — O09213 Supervision of pregnancy with history of pre-term labor, third trimester: Secondary | ICD-10-CM

## 2012-11-02 LAB — POCT URINALYSIS DIPSTICK
Glucose, UA: 4
Ketones, UA: NEGATIVE
Nitrite, UA: NEGATIVE

## 2012-11-02 NOTE — Progress Notes (Signed)
Swelling worse at night. Pain in pelvic region. Spotting yest am.

## 2012-11-02 NOTE — Patient Instructions (Addendum)
Continue twice weekly testing NST's  With followup u/s at 38+ weeks prior to Induction at 39 weeks.

## 2012-11-02 NOTE — Progress Notes (Signed)
U/S (35+6wks)-vtx active fetus, fluid wnl, BPP 8/8, AFI = 9.6cm, post gr 1 plac, EFW 8 lb 15oz **(4041 gms >97th%tile)**, female fetus [place]")

## 2012-11-02 NOTE — Progress Notes (Signed)
Prob: Fetal macrosomia, EFW >97%

## 2012-11-04 LAB — US OB FOLLOW UP
Biparietal Diameter: 9.63 cm
Estimated Fetal Weight: 4041 grams

## 2012-11-05 ENCOUNTER — Ambulatory Visit: Payer: PRIVATE HEALTH INSURANCE | Admitting: Obstetrics and Gynecology

## 2012-11-05 ENCOUNTER — Ambulatory Visit (INDEPENDENT_AMBULATORY_CARE_PROVIDER_SITE_OTHER): Payer: PRIVATE HEALTH INSURANCE | Admitting: Obstetrics and Gynecology

## 2012-11-05 VITALS — BP 114/60 | Wt 242.4 lb

## 2012-11-05 DIAGNOSIS — O99810 Abnormal glucose complicating pregnancy: Secondary | ICD-10-CM

## 2012-11-05 DIAGNOSIS — O09219 Supervision of pregnancy with history of pre-term labor, unspecified trimester: Secondary | ICD-10-CM

## 2012-11-05 DIAGNOSIS — O0990 Supervision of high risk pregnancy, unspecified, unspecified trimester: Secondary | ICD-10-CM

## 2012-11-05 DIAGNOSIS — O0993 Supervision of high risk pregnancy, unspecified, third trimester: Secondary | ICD-10-CM | POA: Insufficient documentation

## 2012-11-05 DIAGNOSIS — O3660X0 Maternal care for excessive fetal growth, unspecified trimester, not applicable or unspecified: Secondary | ICD-10-CM

## 2012-11-05 LAB — POCT URINALYSIS DIPSTICK

## 2012-11-05 NOTE — Addendum Note (Signed)
Addended by: Criss Alvine on: 11/05/2012 12:52 PM   Modules accepted: Orders

## 2012-11-05 NOTE — Progress Notes (Signed)
nst reactive at [redacted]w[redacted]d,  Sugars 96 or less fasting, none above 120 BY PT HX ONLY.  Pt again didn't bring sheets or glucometer.  Re-EMPHASIZED JVF

## 2012-11-05 NOTE — Patient Instructions (Signed)
Twice weekly testing. Bring glucometer

## 2012-11-08 ENCOUNTER — Encounter (HOSPITAL_COMMUNITY): Payer: Self-pay | Admitting: *Deleted

## 2012-11-08 ENCOUNTER — Telehealth: Payer: Self-pay | Admitting: *Deleted

## 2012-11-08 ENCOUNTER — Ambulatory Visit (INDEPENDENT_AMBULATORY_CARE_PROVIDER_SITE_OTHER): Payer: 59 | Admitting: Obstetrics and Gynecology

## 2012-11-08 ENCOUNTER — Inpatient Hospital Stay (HOSPITAL_COMMUNITY)
Admission: AD | Admit: 2012-11-08 | Discharge: 2012-11-08 | Disposition: A | Discharge status: Home / Self Care | Payer: Medicaid Other | Source: Ambulatory Visit | Attending: Obstetrics and Gynecology | Admitting: Obstetrics and Gynecology

## 2012-11-08 ENCOUNTER — Other Ambulatory Visit: Payer: PRIVATE HEALTH INSURANCE | Admitting: Obstetrics and Gynecology

## 2012-11-08 VITALS — BP 116/74 | Wt 241.6 lb

## 2012-11-08 DIAGNOSIS — O99810 Abnormal glucose complicating pregnancy: Secondary | ICD-10-CM

## 2012-11-08 DIAGNOSIS — O3660X0 Maternal care for excessive fetal growth, unspecified trimester, not applicable or unspecified: Secondary | ICD-10-CM

## 2012-11-08 DIAGNOSIS — Z331 Pregnant state, incidental: Secondary | ICD-10-CM

## 2012-11-08 DIAGNOSIS — Z1389 Encounter for screening for other disorder: Secondary | ICD-10-CM

## 2012-11-08 DIAGNOSIS — O09219 Supervision of pregnancy with history of pre-term labor, unspecified trimester: Secondary | ICD-10-CM

## 2012-11-08 DIAGNOSIS — O09899 Supervision of other high risk pregnancies, unspecified trimester: Secondary | ICD-10-CM

## 2012-11-08 DIAGNOSIS — O479 False labor, unspecified: Secondary | ICD-10-CM | POA: Insufficient documentation

## 2012-11-08 DIAGNOSIS — O0993 Supervision of high risk pregnancy, unspecified, third trimester: Secondary | ICD-10-CM

## 2012-11-08 LAB — POCT URINALYSIS DIPSTICK
Ketones, UA: NEGATIVE
Leukocytes, UA: NEGATIVE

## 2012-11-08 NOTE — Progress Notes (Signed)
Pt states contraction 8-10 minutes apart, seen this am at [facility].with dx of false labor. Pt having brief contrn q5', Cx unchanged at 1cm /50%/-2.Marland Kitchenabdominal tenderness 4 pm. No srom or bleeding.Will rechect 30'min. nst REACTIVE criteria met.  BTL papers signed

## 2012-11-08 NOTE — Telephone Encounter (Signed)
Pt states having contractions early this am 8-10 minutes but have intensified, pt instructed to to to [facility] to be evaluated. No MD here until 9:30 am this morning. Pt verbalized understanding.

## 2012-11-08 NOTE — MAU Note (Signed)
Patient woke at 0330 this am with contractions 5-8 minutes apart. Denies leaking, bleeding. Checked last week at office and was 1 cm 50%.

## 2012-11-09 ENCOUNTER — Telehealth: Payer: Self-pay | Admitting: *Deleted

## 2012-11-09 DIAGNOSIS — G4700 Insomnia, unspecified: Secondary | ICD-10-CM

## 2012-11-09 MED ORDER — ZOLPIDEM TARTRATE 10 MG PO TABS: 10.0000 mg | ORAL_TABLET | Freq: Every evening | ORAL | Status: DC | PRN

## 2012-11-09 NOTE — Telephone Encounter (Signed)
Ambien Rx sent to [facility]

## 2012-11-09 NOTE — Telephone Encounter (Signed)
Medication faxed to the Pharmacy, left message on pt's phone that Rx was sent to pharmacy.

## 2012-11-12 ENCOUNTER — Encounter: Payer: Self-pay | Admitting: Obstetrics & Gynecology

## 2012-11-12 ENCOUNTER — Other Ambulatory Visit: Payer: PRIVATE HEALTH INSURANCE

## 2012-11-12 ENCOUNTER — Encounter: Payer: PRIVATE HEALTH INSURANCE | Admitting: Obstetrics & Gynecology

## 2012-11-12 ENCOUNTER — Ambulatory Visit (INDEPENDENT_AMBULATORY_CARE_PROVIDER_SITE_OTHER): Payer: 59 | Admitting: Obstetrics & Gynecology

## 2012-11-12 VITALS — BP 110/80 | Wt 241.0 lb

## 2012-11-12 DIAGNOSIS — O99810 Abnormal glucose complicating pregnancy: Secondary | ICD-10-CM

## 2012-11-12 DIAGNOSIS — Z1389 Encounter for screening for other disorder: Secondary | ICD-10-CM

## 2012-11-12 DIAGNOSIS — O09219 Supervision of pregnancy with history of pre-term labor, unspecified trimester: Secondary | ICD-10-CM

## 2012-11-12 DIAGNOSIS — O09899 Supervision of other high risk pregnancies, unspecified trimester: Secondary | ICD-10-CM

## 2012-11-12 DIAGNOSIS — G4700 Insomnia, unspecified: Secondary | ICD-10-CM

## 2012-11-12 DIAGNOSIS — O3660X0 Maternal care for excessive fetal growth, unspecified trimester, not applicable or unspecified: Secondary | ICD-10-CM

## 2012-11-12 LAB — POCT URINALYSIS DIPSTICK
Leukocytes, UA: NEGATIVE
Nitrite, UA: NEGATIVE

## 2012-11-12 MED ORDER — ZOLPIDEM TARTRATE 10 MG PO TABS: 10.0000 mg | ORAL_TABLET | Freq: Every evening | ORAL | Status: DC | PRN

## 2012-11-12 NOTE — Addendum Note (Signed)
Addended by: Lazaro Arms on: 11/12/2012 10:48 AM   Modules accepted: Orders

## 2012-11-12 NOTE — Patient Instructions (Signed)
Breastfeeding Deciding to breastfeed is one of the best choices you can make for you and your baby. The information that follows gives a brief overview of the benefits of breastfeeding as well as common topics surrounding breastfeeding. BENEFITS OF BREASTFEEDING For the baby  The first milk (colostrum) helps the baby's digestive system function better.   There are antibodies in the mother's milk that help the baby fight off infections.   The baby has a lower incidence of asthma, allergies, and sudden infant death syndrome (SIDS).   The nutrients in breast milk are better for the baby than infant formulas, and breast milk helps the baby's brain grow better.   Babies who breastfeed have less gas, colic, and constipation.  For the mother  Breastfeeding helps develop a very special bond between the mother and her baby.   Breastfeeding is convenient, always available at the correct temperature, and costs nothing.   Breastfeeding burns calories in the mother and helps her lose weight that was gained during pregnancy.   Breastfeeding makes the uterus contract back down to normal size faster and slows bleeding following delivery.   Breastfeeding mothers have a lower risk of developing breast cancer.  BREASTFEEDING FREQUENCY  A healthy, full-term baby may breastfeed as often as every hour or space his or her feedings to every 3 hours.   Watch your baby for signs of hunger. Nurse your baby if he or she shows signs of hunger. How often you nurse will vary from baby to baby.   Nurse as often as the baby requests, or when you feel the need to reduce the fullness of your breasts.   Awaken the baby if it has been 3 4 hours since the last feeding.   Frequent feeding will help the mother make more milk and will help prevent problems, such as sore nipples and engorgement of the breasts.  BABY'S POSITION AT THE BREAST  Whether lying down or sitting, be sure that the baby's tummy is  facing your tummy.   Support the breast with 4 fingers underneath the breast and the thumb above. Make sure your fingers are well away from the nipple and baby's mouth.   Stroke the baby's lips gently with your finger or nipple.   When the baby's mouth is open wide enough, place all of your nipple and as much of the areola as possible into your baby's mouth.   Pull the baby in close so the tip of the nose and the baby's cheeks touch the breast during the feeding.  FEEDINGS AND SUCTION  The length of each feeding varies from baby to baby and from feeding to feeding.   The baby must suck about 2 3 minutes for your milk to get to him or her. This is called a "let down." For this reason, allow the baby to feed on each breast as long as he or she wants. Your baby will end the feeding when he or she has received the right balance of nutrients.   To break the suction, put your finger into the corner of the baby's mouth and slide it between his or her gums before removing your breast from his or her mouth. This will help prevent sore nipples.  HOW TO TELL WHETHER YOUR BABY IS GETTING ENOUGH BREAST MILK. Wondering whether or not your baby is getting enough milk is a common concern among mothers. You can be assured that your baby is getting enough milk if:   Your baby is actively   sucking and you hear swallowing.   Your baby seems relaxed and satisfied after a feeding.   Your baby nurses at least 8 12 times in a 24 hour time period. Nurse your baby until he or she unlatches or falls asleep at the first breast (at least 10 20 minutes), then offer the second side.   Your baby is wetting 5 6 disposable diapers (6 8 cloth diapers) in a 24 hour period by 5 6 days of age.   Your baby is having at least 3 4 stools every 24 hours for the first 6 weeks. The stool should be soft and yellow.   Your baby should gain 4 7 ounces per week after he or she is 4 days old.   Your breasts feel softer  after nursing.  REDUCING BREAST ENGORGEMENT  In the first week after your baby is born, you may experience signs of breast engorgement. When breasts are engorged, they feel heavy, warm, full, and may be tender to the touch. You can reduce engorgement if you:   Nurse frequently, every 2 3 hours. Mothers who breastfeed early and often have fewer problems with engorgement.   Place light ice packs on your breasts for 10 20 minutes between feedings. This reduces swelling. Wrap the ice packs in a lightweight towel to protect your skin. Bags of frozen vegetables work well for this purpose.   Take a warm shower or apply warm, moist heat to your breast for 5 10 minutes just before each feeding. This increases circulation and helps the milk flow.   Gently massage your breast before and during the feeding. Using your finger tips, massage from the chest wall towards your nipple in a circular motion.   Make sure that the baby empties at least one breast at every feeding before switching sides.   Use a breast pump to empty the breasts if your baby is sleepy or not nursing well. You may also want to pump if you are returning to work oryou feel you are getting engorged.   Avoid bottle feeds, pacifiers, or supplemental feedings of water or juice in place of breastfeeding. Breast milk is all the food your baby needs. It is not necessary for your baby to have water or formula. In fact, to help your breasts make more milk, it is best not to give your baby supplemental feedings during the early weeks.   Be sure the baby is latched on and positioned properly while breastfeeding.   Wear a supportive bra, avoiding underwire styles.   Eat a balanced diet with enough fluids.   Rest often, relax, and take your prenatal vitamins to prevent fatigue, stress, and anemia.  If you follow these suggestions, your engorgement should improve in 24 48 hours. If you are still experiencing difficulty, call your  lactation consultant or caregiver.  CARING FOR YOURSELF Take care of your breasts  Bathe or shower daily.   Avoid using soap on your nipples.   Start feedings on your left breast at one feeding and on your right breast at the next feeding.   You will notice an increase in your milk supply 2 5 days after delivery. You may feel some discomfort from engorgement, which makes your breasts very firm and often tender. Engorgement "peaks" out within 24 48 hours. In the meantime, apply warm moist towels to your breasts for 5 10 minutes before feeding. Gentle massage and expression of some milk before feeding will soften your breasts, making it easier for your   baby to latch on.   Wear a well-fitting nursing bra, and air dry your nipples for a 3 4minutes after each feeding.   Only use cotton bra pads.   Only use pure lanolin on your nipples after nursing. You do not need to wash it off before feeding the baby again. Another option is to express a few drops of breast milk and gently massage it into your nipples.  Take care of yourself  Eat well-balanced meals and nutritious snacks.   Drinking milk, fruit juice, and water to satisfy your thirst (about 8 glasses a day).   Get plenty of rest.  Avoid foods that you notice affect the baby in a bad way.  SEEK MEDICAL CARE IF:   You have difficulty with breastfeeding and need help.   You have a hard, red, sore area on your breast that is accompanied by a fever.   Your baby is too sleepy to eat well or is having trouble sleeping.   Your baby is wetting less than 6 diapers a day, by 5 days of age.   Your baby's skin or white part of his or her eyes is more yellow than it was in the hospital.   You feel depressed.  Document Released: 06/27/2005 Document Revised: 12/27/2011 Document Reviewed: 09/25/2011 ExitCare Patient Information 2013 [employer].  

## 2012-11-12 NOTE — Progress Notes (Signed)
BP weight and urine results all reviewed and noted. Patient reports good fetal movement, denies any bleeding and /or regular contractions. Patient is without complaints. All questions were answered. She had question of fluid leakageafter urination.  Visually negative with negative pH Blood sugars are excellent Continue current care plan.

## 2012-11-15 ENCOUNTER — Ambulatory Visit (INDEPENDENT_AMBULATORY_CARE_PROVIDER_SITE_OTHER): Payer: 59 | Admitting: Obstetrics and Gynecology

## 2012-11-15 ENCOUNTER — Other Ambulatory Visit: Payer: PRIVATE HEALTH INSURANCE

## 2012-11-15 VITALS — BP 120/70 | Wt 245.0 lb

## 2012-11-15 DIAGNOSIS — Z3480 Encounter for supervision of other normal pregnancy, unspecified trimester: Secondary | ICD-10-CM

## 2012-11-15 DIAGNOSIS — O09219 Supervision of pregnancy with history of pre-term labor, unspecified trimester: Secondary | ICD-10-CM

## 2012-11-15 DIAGNOSIS — Z331 Pregnant state, incidental: Secondary | ICD-10-CM

## 2012-11-15 DIAGNOSIS — O3660X0 Maternal care for excessive fetal growth, unspecified trimester, not applicable or unspecified: Secondary | ICD-10-CM

## 2012-11-15 DIAGNOSIS — O99810 Abnormal glucose complicating pregnancy: Secondary | ICD-10-CM

## 2012-11-15 DIAGNOSIS — O09899 Supervision of other high risk pregnancies, unspecified trimester: Secondary | ICD-10-CM

## 2012-11-15 DIAGNOSIS — O24419 Gestational diabetes mellitus in pregnancy, unspecified control: Secondary | ICD-10-CM

## 2012-11-15 DIAGNOSIS — O3663X1 Maternal care for excessive fetal growth, third trimester, fetus 1: Secondary | ICD-10-CM

## 2012-11-15 DIAGNOSIS — Z1389 Encounter for screening for other disorder: Secondary | ICD-10-CM

## 2012-11-15 LAB — POCT URINALYSIS DIPSTICK
Nitrite, UA: NEGATIVE
Protein, UA: NEGATIVE

## 2012-11-15 NOTE — Progress Notes (Signed)
Braxton hicks contractions, c/o sharp pain at vaginal area, spotting.  NST reactive. Scheduled for IOL at 7:30 pm Friday, will deliver Saturday at 39 .0.  Pt 's husb must work Sunday night.  PPBTL planned for 4 wk postpartum  Breast feeding

## 2012-11-15 NOTE — Patient Instructions (Addendum)
U/s monday

## 2012-11-16 ENCOUNTER — Encounter (HOSPITAL_COMMUNITY): Payer: Self-pay | Admitting: *Deleted

## 2012-11-16 ENCOUNTER — Telehealth (HOSPITAL_COMMUNITY): Payer: Self-pay | Admitting: *Deleted

## 2012-11-16 NOTE — Telephone Encounter (Signed)
Preadmission screen  

## 2012-11-19 ENCOUNTER — Ambulatory Visit (INDEPENDENT_AMBULATORY_CARE_PROVIDER_SITE_OTHER): Payer: 59 | Admitting: Obstetrics and Gynecology

## 2012-11-19 ENCOUNTER — Ambulatory Visit (INDEPENDENT_AMBULATORY_CARE_PROVIDER_SITE_OTHER): Payer: 59

## 2012-11-19 ENCOUNTER — Other Ambulatory Visit: Payer: Self-pay | Admitting: Obstetrics and Gynecology

## 2012-11-19 VITALS — BP 126/70 | Wt 246.6 lb

## 2012-11-19 DIAGNOSIS — O99810 Abnormal glucose complicating pregnancy: Secondary | ICD-10-CM

## 2012-11-19 DIAGNOSIS — O09219 Supervision of pregnancy with history of pre-term labor, unspecified trimester: Secondary | ICD-10-CM

## 2012-11-19 DIAGNOSIS — O3663X1 Maternal care for excessive fetal growth, third trimester, fetus 1: Secondary | ICD-10-CM

## 2012-11-19 DIAGNOSIS — Z331 Pregnant state, incidental: Secondary | ICD-10-CM

## 2012-11-19 DIAGNOSIS — O3660X0 Maternal care for excessive fetal growth, unspecified trimester, not applicable or unspecified: Secondary | ICD-10-CM

## 2012-11-19 DIAGNOSIS — Z3480 Encounter for supervision of other normal pregnancy, unspecified trimester: Secondary | ICD-10-CM

## 2012-11-19 DIAGNOSIS — Z1389 Encounter for screening for other disorder: Secondary | ICD-10-CM

## 2012-11-19 DIAGNOSIS — O09899 Supervision of other high risk pregnancies, unspecified trimester: Secondary | ICD-10-CM

## 2012-11-19 LAB — POCT URINALYSIS DIPSTICK
Ketones, UA: NEGATIVE
Nitrite, UA: NEGATIVE
Protein, UA: NEGATIVE

## 2012-11-19 LAB — US OB FOLLOW UP

## 2012-11-19 NOTE — Progress Notes (Signed)
Fetal macrosomia:  EFW 10 lb 9 oz.  Lengthy discussion of options of IOL versus scheduled repeat cesarean this weekend when pt 39 wk. Specific risks such as shoulder dystocia discussed at length with pt and husb, emphasizing unpredictable nature of shoulder dystocia. Pt called back with opinion that they desire IOL instead of scheduled C/s, after much counseling by me and discussion Pt discussed with Dr Despina Hidden, who is on call this weekend . He will see pt Thursday at NST  jvferg.

## 2012-11-19 NOTE — Progress Notes (Signed)
U/S (38+2wks)-vtx active fetus, EFW 10 lb 9 oz (4781gms, >97th%tile), fluid wnl AFI=15.4cm, post fundal gr 2 plac, BPP 8/8 female fetus "Brooke Espinoza"

## 2012-11-21 ENCOUNTER — Telehealth: Payer: Self-pay | Admitting: Obstetrics & Gynecology

## 2012-11-21 NOTE — Telephone Encounter (Signed)
Pt informed of positive GBS.

## 2012-11-22 ENCOUNTER — Other Ambulatory Visit: Payer: PRIVATE HEALTH INSURANCE

## 2012-11-22 ENCOUNTER — Ambulatory Visit: Payer: PRIVATE HEALTH INSURANCE | Admitting: Obstetrics & Gynecology

## 2012-11-22 ENCOUNTER — Encounter: Payer: Self-pay | Admitting: Obstetrics & Gynecology

## 2012-11-22 VITALS — BP 110/80 | Wt 244.0 lb

## 2012-11-22 DIAGNOSIS — Z1389 Encounter for screening for other disorder: Secondary | ICD-10-CM

## 2012-11-22 LAB — POCT URINALYSIS DIPSTICK: Nitrite, UA: NEGATIVE

## 2012-11-23 ENCOUNTER — Encounter (HOSPITAL_COMMUNITY): Payer: Self-pay

## 2012-11-23 ENCOUNTER — Inpatient Hospital Stay (HOSPITAL_COMMUNITY)
Admission: RE | Admit: 2012-11-23 | Discharge: 2012-11-26 | DRG: 775 | Disposition: A | Discharge status: Home / Self Care | Payer: PRIVATE HEALTH INSURANCE | Source: Ambulatory Visit | Attending: Obstetrics and Gynecology | Admitting: Obstetrics and Gynecology

## 2012-11-23 ENCOUNTER — Inpatient Hospital Stay (HOSPITAL_COMMUNITY): Admission: RE | Admit: 2012-11-23 | Payer: PRIVATE HEALTH INSURANCE | Source: Ambulatory Visit

## 2012-11-23 VITALS — BP 106/69 | HR 71 | Temp 98.2°F | Resp 18 | Ht 66.0 in | Wt 244.0 lb

## 2012-11-23 DIAGNOSIS — O99810 Abnormal glucose complicating pregnancy: Secondary | ICD-10-CM

## 2012-11-23 DIAGNOSIS — O99814 Abnormal glucose complicating childbirth: Principal | ICD-10-CM | POA: Diagnosis present

## 2012-11-23 DIAGNOSIS — O99892 Other specified diseases and conditions complicating childbirth: Secondary | ICD-10-CM | POA: Diagnosis present

## 2012-11-23 DIAGNOSIS — O3660X0 Maternal care for excessive fetal growth, unspecified trimester, not applicable or unspecified: Secondary | ICD-10-CM | POA: Diagnosis present

## 2012-11-23 DIAGNOSIS — Z22330 Carrier of Group B streptococcus: Secondary | ICD-10-CM

## 2012-11-23 HISTORY — DX: Unspecified asthma, uncomplicated: J45.909

## 2012-11-23 LAB — CBC
HCT: 32 % — ABNORMAL LOW (ref 36.0–46.0)
Hemoglobin: 10.2 g/dL — ABNORMAL LOW (ref 12.0–15.0)
RBC: 4.18 MIL/uL (ref 3.87–5.11)
WBC: 7.7 10*3/uL (ref 4.0–10.5)

## 2012-11-23 LAB — TYPE AND SCREEN: Antibody Screen: NEGATIVE

## 2012-11-23 LAB — GLUCOSE, CAPILLARY: Glucose-Capillary: 87 mg/dL (ref 70–99)

## 2012-11-23 MED ORDER — LIDOCAINE HCL (PF) 1 % IJ SOLN
30.0000 mL | INTRAMUSCULAR | Status: DC | PRN
  Filled 2012-11-23 (×2): qty 30

## 2012-11-23 MED ORDER — LACTATED RINGERS IV SOLN
500.0000 mL | INTRAVENOUS | Status: DC | PRN
  Administered 2012-11-24: 500 mL via INTRAVENOUS

## 2012-11-23 MED ORDER — LACTATED RINGERS IV SOLN
INTRAVENOUS | Status: DC
  Administered 2012-11-23: 125 mL/h via INTRAVENOUS
  Administered 2012-11-24: 15:00:00 via INTRAVENOUS

## 2012-11-23 MED ORDER — OXYCODONE-ACETAMINOPHEN 5-325 MG PO TABS: 1.0000 | ORAL_TABLET | ORAL | Status: DC | PRN

## 2012-11-23 MED ORDER — OXYTOCIN BOLUS FROM INFUSION: 500.0000 mL | INTRAVENOUS | Status: DC

## 2012-11-23 MED ORDER — ACETAMINOPHEN 325 MG PO TABS
650.0000 mg | ORAL_TABLET | ORAL | Status: DC | PRN
  Administered 2012-11-24: 650 mg via ORAL
  Filled 2012-11-23: qty 2

## 2012-11-23 MED ORDER — ZOLPIDEM TARTRATE 5 MG PO TABS
10.0000 mg | ORAL_TABLET | Freq: Every evening | ORAL | Status: DC | PRN
  Administered 2012-11-23: 10 mg via ORAL
  Filled 2012-11-23: qty 2

## 2012-11-23 MED ORDER — MISOPROSTOL 25 MCG QUARTER TABLET
25.0000 ug | ORAL_TABLET | ORAL | Status: DC | PRN
  Administered 2012-11-23 – 2012-11-24 (×2): 25 ug via VAGINAL
  Filled 2012-11-23: qty 0.25
  Filled 2012-11-23: qty 1
  Filled 2012-11-23: qty 0.25

## 2012-11-23 MED ORDER — IBUPROFEN 600 MG PO TABS: 600.0000 mg | ORAL_TABLET | Freq: Four times a day (QID) | ORAL | Status: DC | PRN

## 2012-11-23 MED ORDER — ONDANSETRON HCL 4 MG/2ML IJ SOLN: 4.0000 mg | Freq: Four times a day (QID) | INTRAMUSCULAR | Status: DC | PRN

## 2012-11-23 MED ORDER — OXYTOCIN 40 UNITS IN LACTATED RINGERS INFUSION - SIMPLE MED: 62.5000 mL/h | INTRAVENOUS | Status: DC

## 2012-11-23 MED ORDER — PENICILLIN G POTASSIUM 5000000 UNITS IJ SOLR
5.0000 10*6.[IU] | Freq: Once | INTRAMUSCULAR | Status: AC
  Administered 2012-11-24: 5 10*6.[IU] via INTRAVENOUS
  Filled 2012-11-23: qty 5

## 2012-11-23 MED ORDER — PENICILLIN G POTASSIUM 5000000 UNITS IJ SOLR
2.5000 10*6.[IU] | INTRAVENOUS | Status: DC
  Administered 2012-11-24 (×3): 2.5 10*6.[IU] via INTRAVENOUS
  Filled 2012-11-23 (×8): qty 2.5

## 2012-11-23 MED ORDER — NALBUPHINE SYRINGE 5 MG/0.5 ML
10.0000 mg | INJECTION | INTRAMUSCULAR | Status: DC | PRN
  Administered 2012-11-24 (×2): 10 mg via INTRAVENOUS
  Filled 2012-11-23 (×3): qty 1

## 2012-11-23 MED ORDER — FLEET ENEMA 7-19 GM/118ML RE ENEM: 1.0000 | ENEMA | RECTAL | Status: DC | PRN

## 2012-11-23 MED ORDER — CITRIC ACID-SODIUM CITRATE 334-500 MG/5ML PO SOLN: 30.0000 mL | ORAL | Status: DC | PRN

## 2012-11-23 MED ORDER — TERBUTALINE SULFATE 1 MG/ML IJ SOLN: 0.2500 mg | Freq: Once | INTRAMUSCULAR | Status: AC | PRN

## 2012-11-23 NOTE — Progress Notes (Signed)
Brooke Espinoza is a 27 y.o. [phone] at [redacted]w[redacted]d  Subjective: Comfortable; not feeling ctx as painful  Objective: BP 128/76  Pulse 90  Temp(Src) 98 F (36.7 C) (Oral)  Resp 18  LMP 02/08/2012      FHT:  FHR: 145 bpm, variability: moderate,  accelerations:  Present,  decelerations:  Absent UC:   irregular, every 2-6 minutes SVE:   Dilation: 1 Effacement (%): 50 Exam by:: K. Shaw,CNM  Labs: Lab Results  Component Value Date   WBC 7.7 11/23/2012   HGB 10.2* 11/23/2012   HCT 32.0* 11/23/2012   MCV 76.6* 11/23/2012   PLT 256 11/23/2012    Assessment / Plan: IUP at 38.6wks A2GDM Macrosomia Unfavorable cx GBS pos  Cytotec #1 placed; anticipate rpt doses of cytotec during the night; PCN when active or ROM   SHAW, KIMBERLY 11/23/2012, 10:44 PM

## 2012-11-23 NOTE — H&P (Signed)
Brooke Espinoza is a 27 y.o. female presenting for induction of labor for A2 GDM. Gets care at [facility]. Has been on glyburide with well controlled blood sugars. No bleeding or loss of fluid today. Baby moving well. Some mild contractions tonight at [employer]. No other complications with pregnancy. Hx of two prior LGA babies delivered vaginally with no complications. EFW for this fetus 4700 g. Has noticed some swelling in feet today but no headache or vision changes.  Maternal Medical History:  Prenatal Complications - Diabetes: gestational. Diabetes is managed by oral agent (monotherapy).      OB History   Grav Para Term Preterm Abortions TAB SAB Ect Mult Living   4 2 2  0 1 0 0 1 0 2     Past Medical History  Diagnosis Date  . Pregnant   . Gestational diabetes    Past Surgical History  Procedure Laterality Date  . Elbow surgery Left    Family History: family history includes Dementia in her maternal grandfather; Diabetes in her maternal grandmother; Heart disease in her father and maternal grandmother; Hypertension in her father, maternal grandmother, and mother; Mental illness in her mother; and Thyroid disease in her father. Social History:  reports that she has never smoked. She has never used smokeless tobacco. She reports that  drinks alcohol. She reports that she does not use illicit drugs.   Prenatal Transfer Tool  Maternal Diabetes: Yes:  Diabetes Type:  Insulin/Medication controlled Genetic Screening: Normal Maternal Ultrasounds/Referrals: Normal Fetal Ultrasounds or other Referrals:  None Maternal Substance Abuse:  No Significant Maternal Medications:  Meds include: Other:  Significant Maternal Lab Results:  Lab values include: Group B Strep positive Other Comments:  Fetal macrosomia - 4700 g EFW  ROS  Pertinent pos and neg mentioned in HPI    Last menstrual period 02/08/2012. Maternal Exam:  Introitus: Vagina is negative for discharge.    Fetal Exam Fetal  Monitor Review: Mode: ultrasound.   Baseline rate: 170s then down to 155.  Variability: moderate (6-25 bpm).   Pattern: accelerations present and no decelerations.       Physical Exam  Constitutional: She is oriented to person, place, and time. No distress.  Obese  HENT:  Head: Normocephalic and atraumatic.  Eyes: Conjunctivae and EOM are normal.  Neck: Normal range of motion. Neck supple.  Cardiovascular: Normal rate.   Respiratory: Effort normal. No respiratory distress.  GI: Soft. There is no tenderness. There is no rebound and no guarding.  Genitourinary: Vagina normal. No vaginal discharge found.  Musculoskeletal: Normal range of motion. She exhibits edema (mild lower extermity). She exhibits no tenderness.  Neurological: She is alert and oriented to person, place, and time.  Skin: Skin is warm and dry.  Psychiatric: She has a normal mood and affect.    Prenatal labs: ABO, Rh: A/Positive/-- (09/26 0000) Antibody: Negative (09/26 0000) Rubella: Immune (09/26 0000) RPR: Nonreactive (09/26 0000)  HBsAg: Negative (09/26 0000)  HIV: Non-reactive (09/26 0000)  GBS: POSITIVE (05/08 1057)   Assessment/Plan: 27 y.o. [id number] at [redacted]w[redacted]d with A2 GDM here for IOL. - Monitor CBG q 4 hours and maintain < 120 - Not planning epidural - PCN for GBS prophylaxis when in active labor - Anticipate SVD   Napoleon Form 11/23/2012, 7:09 PM

## 2012-11-24 ENCOUNTER — Encounter (HOSPITAL_COMMUNITY): Payer: Self-pay

## 2012-11-24 ENCOUNTER — Encounter (HOSPITAL_COMMUNITY): Payer: Self-pay | Admitting: Anesthesiology

## 2012-11-24 ENCOUNTER — Inpatient Hospital Stay (HOSPITAL_COMMUNITY): Payer: PRIVATE HEALTH INSURANCE | Admitting: Anesthesiology

## 2012-11-24 DIAGNOSIS — O3660X0 Maternal care for excessive fetal growth, unspecified trimester, not applicable or unspecified: Secondary | ICD-10-CM

## 2012-11-24 DIAGNOSIS — O9989 Other specified diseases and conditions complicating pregnancy, childbirth and the puerperium: Secondary | ICD-10-CM

## 2012-11-24 DIAGNOSIS — O99814 Abnormal glucose complicating childbirth: Secondary | ICD-10-CM

## 2012-11-24 LAB — CBC
MCH: 24.5 pg — ABNORMAL LOW (ref 26.0–34.0)
Platelets: 235 10*3/uL (ref 150–400)
RBC: 4.04 MIL/uL (ref 3.87–5.11)
WBC: 13.9 10*3/uL — ABNORMAL HIGH (ref 4.0–10.5)

## 2012-11-24 LAB — ABO/RH: ABO/RH(D): A POS

## 2012-11-24 LAB — GLUCOSE, CAPILLARY
Glucose-Capillary: 117 mg/dL — ABNORMAL HIGH (ref 70–99)
Glucose-Capillary: 84 mg/dL (ref 70–99)

## 2012-11-24 MED ORDER — FENTANYL 2.5 MCG/ML BUPIVACAINE 1/10 % EPIDURAL INFUSION (WH - ANES)
14.0000 mL/h | INTRAMUSCULAR | Status: DC | PRN
  Filled 2012-11-24: qty 125

## 2012-11-24 MED ORDER — TERBUTALINE SULFATE 1 MG/ML IJ SOLN: 0.2500 mg | Freq: Once | INTRAMUSCULAR | Status: AC | PRN

## 2012-11-24 MED ORDER — PHENYLEPHRINE 40 MCG/ML (10ML) SYRINGE FOR IV PUSH (FOR BLOOD PRESSURE SUPPORT)
80.0000 ug | PREFILLED_SYRINGE | INTRAVENOUS | Status: DC | PRN
  Filled 2012-11-24: qty 2
  Filled 2012-11-24: qty 5

## 2012-11-24 MED ORDER — FENTANYL 2.5 MCG/ML BUPIVACAINE 1/10 % EPIDURAL INFUSION (WH - ANES): 12.0000 mL/h | INTRAMUSCULAR | Status: DC | PRN

## 2012-11-24 MED ORDER — LIDOCAINE HCL (PF) 1 % IJ SOLN
INTRAMUSCULAR | Status: DC | PRN
  Administered 2012-11-24 (×2): 4 mL

## 2012-11-24 MED ORDER — FENTANYL CITRATE 0.05 MG/ML IJ SOLN
100.0000 ug | INTRAMUSCULAR | Status: DC | PRN
  Administered 2012-11-24: 100 ug via INTRAVENOUS

## 2012-11-24 MED ORDER — FENTANYL CITRATE 0.05 MG/ML IJ SOLN
INTRAMUSCULAR | Status: AC
  Filled 2012-11-24: qty 2

## 2012-11-24 MED ORDER — EPHEDRINE 5 MG/ML INJ
10.0000 mg | INTRAVENOUS | Status: DC | PRN
  Filled 2012-11-24: qty 2

## 2012-11-24 MED ORDER — FENTANYL 2.5 MCG/ML BUPIVACAINE 1/10 % EPIDURAL INFUSION (WH - ANES)
INTRAMUSCULAR | Status: DC | PRN
  Administered 2012-11-24: 14 mL/h via EPIDURAL

## 2012-11-24 MED ORDER — PHENYLEPHRINE 40 MCG/ML (10ML) SYRINGE FOR IV PUSH (FOR BLOOD PRESSURE SUPPORT)
80.0000 ug | PREFILLED_SYRINGE | INTRAVENOUS | Status: DC | PRN
  Filled 2012-11-24: qty 2

## 2012-11-24 MED ORDER — OXYTOCIN 40 UNITS IN LACTATED RINGERS INFUSION - SIMPLE MED
1.0000 m[IU]/min | INTRAVENOUS | Status: DC
  Administered 2012-11-24: 2 m[IU]/min via INTRAVENOUS
  Filled 2012-11-24: qty 1000

## 2012-11-24 MED ORDER — EPHEDRINE 5 MG/ML INJ
10.0000 mg | INTRAVENOUS | Status: DC | PRN
  Filled 2012-11-24: qty 4
  Filled 2012-11-24: qty 2

## 2012-11-24 MED ORDER — LACTATED RINGERS IV SOLN: 500.0000 mL | Freq: Once | INTRAVENOUS | Status: DC

## 2012-11-24 MED ORDER — DIPHENHYDRAMINE HCL 50 MG/ML IJ SOLN: 12.5000 mg | INTRAMUSCULAR | Status: DC | PRN

## 2012-11-24 MED ORDER — MISOPROSTOL 200 MCG PO TABS: 800.0000 ug | ORAL_TABLET | Freq: Once | ORAL | Status: AC

## 2012-11-24 MED ORDER — MISOPROSTOL 200 MCG PO TABS
ORAL_TABLET | ORAL | Status: AC
  Administered 2012-11-24: 800 ug via RECTAL
  Filled 2012-11-24: qty 4

## 2012-11-24 NOTE — Anesthesia Procedure Notes (Signed)
Epidural Patient location during procedure: OB Start time: 11/24/2012 7:15 PM End time: 11/24/2012 7:30 PM  Staffing Anesthesiologist: Lewie Loron R  Preanesthetic Checklist Completed: patient identified, pre-op evaluation, timeout performed, IV checked, risks and benefits discussed and monitors and equipment checked  Epidural Patient position: sitting Prep: DuraPrep Patient monitoring: heart rate, continuous pulse ox and blood pressure Injection technique: LOR air and LOR saline  Needle:  Needle type: Tuohy  Needle gauge: 17 G Needle length: 9 cm Needle insertion depth: 9 cm Catheter type: closed end flexible Catheter size: 19 Gauge Catheter at skin depth: 15 cm  Assessment Sensory level: T8 Events: blood not aspirated, injection not painful, no injection resistance, negative IV test and no paresthesia  Additional Notes Reason for block:procedure for pain

## 2012-11-24 NOTE — Progress Notes (Signed)
Brooke Espinoza is a 27 y.o. [phone] at [redacted]w[redacted]d by ultrasound admitted for induction of labor due to Diabetes and macrosomia.  Subjective: Pt c/o pain with ctx.  S/p epidural.  Still with some pain but, improved.  Objective: BP 110/58  Pulse 100  Temp(Src) 98 F (36.7 C) (Oral)  Resp 18  Ht 5\' 6"  (1.676 m)  Wt 244 lb (110.678 kg)  BMI 39.4 kg/m2  SpO2 100%  LMP 02/08/2012      FHT:  FHR: 140's bpm, variability: moderate,  accelerations:  Present,  decelerations:  Absent UC:   regular, every 2-3 minutes SVE:   Dilation: 7.5 Effacement (%): 90 Station: -1 Exam by:: Dr Erin Fulling  Labs: Lab Results  Component Value Date   WBC 7.7 11/23/2012   HGB 10.2* 11/23/2012   HCT 32.0* 11/23/2012   MCV 76.6* 11/23/2012   PLT 256 11/23/2012    Assessment / Plan: Induction of labor due to gestational diabetes and macrosimia,  progressing well on pitocin  Labor: Progressing normally Preeclampsia:  n/a Fetal Wellbeing:  Category I Pain Control:  Epidural I/D:  n/a Anticipated MOD:  NSVD team made aware of risk of shoulder dystocia due to macrosomia.  D/W pt and husband risks and reviewed that there will be NO operative vaginal delivery.  The family understands and agreed with plan. Questions answered to pts satisfaction.  HARRAWAY-SMITH, Brooke Espinoza 11/24/2012, 8:11 PM

## 2012-11-24 NOTE — Anesthesia Preprocedure Evaluation (Signed)
Anesthesia Evaluation  Patient identified by MRN, date of birth, ID band Patient awake    Reviewed: Allergy & Precautions, H&P , NPO status , Patient's Chart, lab work & pertinent test results  Airway       Dental  (+) Dental Advisory Given   Pulmonary asthma ,          Cardiovascular negative cardio ROS      Neuro/Psych negative neurological ROS  negative psych ROS   GI/Hepatic negative GI ROS, Neg liver ROS,   Endo/Other  diabetes, Oral Hypoglycemic AgentsMorbid obesity  Renal/GU negative Renal ROS     Musculoskeletal negative musculoskeletal ROS (+)   Abdominal   Peds  Hematology negative hematology ROS (+)   Anesthesia Other Findings   Reproductive/Obstetrics (+) Pregnancy                           Anesthesia Physical Anesthesia Plan  ASA: III  Anesthesia Plan: Epidural   Post-op Pain Management:    Induction:   Airway Management Planned:   Additional Equipment:   Intra-op Plan:   Post-operative Plan:   Informed Consent: I have reviewed the patients History and Physical, chart, labs and discussed the procedure including the risks, benefits and alternatives for the proposed anesthesia with the patient or authorized representative who has indicated his/her understanding and acceptance.     Plan Discussed with:   Anesthesia Plan Comments:         Anesthesia Quick Evaluation

## 2012-11-24 NOTE — Progress Notes (Signed)
Brooke Espinoza is a 27 y.o. [phone] at [redacted]w[redacted]d admitted for induction of labor due to A2DM.  Subjective: Somewhat uncomfortable w/ uc's  Objective: BP 131/74  Pulse 79  Temp(Src) 98.4 F (36.9 C) (Oral)  Resp 18  LMP 02/08/2012    CBGs all <110  FHT:  FHR: 135 bpm, variability: moderate,  accelerations:  Present,  decelerations:  Absent UC:   irregular, every 2-4 minutes SVE:   Dilation: 3.5 Effacement (%): 70 Station: -3 Exam by:: [employer]: Lab Results  Component Value Date   WBC 7.7 11/23/2012   HGB 10.2* 11/23/2012   HCT 32.0* 11/23/2012   MCV 76.6* 11/23/2012   PLT 256 11/23/2012    Assessment / Plan: IOL d/t A2DM, fetal macrosomia w/ EFW 4781gm S/P 2 doses of cytotec, will begin pitocin per protocol  Labor: cervical ripening phase complete Preeclampsia:  n/a Fetal Wellbeing:  Category I Pain Control:  Labor support without medications I/D:  will begin PCN per protocol w/ active labor/rom Anticipated MOD:  NSVD  Marge Duncans 11/24/2012, 9:49 AM

## 2012-11-24 NOTE — Progress Notes (Signed)
Brooke Espinoza is a 27 y.o. [phone] at [redacted]w[redacted]d by ultrasound admitted for induction of labor due to Gestational diabetes and Macrosomia.  Subjective: Pt without complaints.  Pain handled well with IV pain meds. Pt wants to proceed without Epidural if possible.    Objective: BP 117/63  Pulse 75  Temp(Src) 98.3 F (36.8 C) (Oral)  Resp 18  Ht 5\' 6"  (1.676 m)  Wt 244 lb (110.678 kg)  BMI 39.4 kg/m2  LMP 02/08/2012      FHT:  FHR: 140's bpm, variability: moderate,  accelerations:  Present,  decelerations:  Absent UC:   regular, every 2-3 minutes SVE:   Dilation: 4 Effacement (%): 60 Station: -3 Exam by:: patti moore rn Rechecked and agree.  IUPC and FSE placed. Labs: Lab Results  Component Value Date   WBC 7.7 11/23/2012   HGB 10.2* 11/23/2012   HCT 32.0* 11/23/2012   MCV 76.6* 11/23/2012   PLT 256 11/23/2012    Assessment / Plan: Induction of labor due to gestational diabetes and macrosomia,  progressing well on pitocin  Labor: s/p AROM progressing as expected Preeclampsia:  n/a Fetal Wellbeing:  Category I Pain Control:  Fentanyl I/D:  n/a Anticipated MOD:  NSVD  HARRAWAY-SMITH, Honestii Marton 11/24/2012, 4:19 PM

## 2012-11-24 NOTE — Progress Notes (Signed)
Brooke Espinoza is a 27 y.o. [phone] at [redacted]w[redacted]d admitted for induction of labor due to A2DM.  Subjective: Not feeling much discomfort w/ uc's- received iv nubain w/in last couple of hours.   Objective: BP 117/63  Pulse 75  Temp(Src) 98.3 F (36.8 C) (Oral)  Resp 18  Ht 5\' 6"  (1.676 m)  Wt 110.678 kg (244 lb)  BMI 39.4 kg/m2  LMP 02/08/2012     Last cbg 117- but had just eaten popsicle  FHT:  FHR: 125 bpm, variability: moderate,  accelerations:  Present,  decelerations:  Absent UC:   irregular, every 2-4 minutes SVE:   4/60/-3 by Dr. Erin Fulling AROM copious amount clear fluid, IUPC and IFSE placed by Dr. Erin Fulling  Labs: Lab Results  Component Value Date   WBC 7.7 11/23/2012   HGB 10.2* 11/23/2012   HCT 32.0* 11/23/2012   MCV 76.6* 11/23/2012   PLT 256 11/23/2012    Assessment / Plan: IOL d/t A2DM, not in active labor yet. Pitocin @ 33mu/min, now arom'd w/ IUPC and IFSE.  RN to continue increasing pitocin per protocol as needed to acheive adequate mvu's/labor CBGs q 4hrs until active labor then q 2hrs  Labor: not yet Preeclampsia:  n/a Fetal Wellbeing:  Category I Pain Control:  nubain I/D:  PCN for gbs pos Anticipated MOD:  NSVD  Marge Duncans 11/24/2012, 4:10 PM

## 2012-11-25 LAB — CBC
HCT: 29.6 % — ABNORMAL LOW (ref 36.0–46.0)
RBC: 3.87 MIL/uL (ref 3.87–5.11)
RDW: 15.2 % (ref 11.5–15.5)
WBC: 9.8 10*3/uL (ref 4.0–10.5)

## 2012-11-25 MED ORDER — DIPHENHYDRAMINE HCL 25 MG PO CAPS: 25.0000 mg | ORAL_CAPSULE | Freq: Four times a day (QID) | ORAL | Status: DC | PRN

## 2012-11-25 MED ORDER — TETANUS-DIPHTH-ACELL PERTUSSIS 5-2.5-18.5 LF-MCG/0.5 IM SUSP
0.5000 mL | Freq: Once | INTRAMUSCULAR | Status: AC
  Administered 2012-11-25: 0.5 mL via INTRAMUSCULAR
  Filled 2012-11-25: qty 0.5

## 2012-11-25 MED ORDER — MEASLES, MUMPS & RUBELLA VAC ~~LOC~~ INJ
0.5000 mL | INJECTION | Freq: Once | SUBCUTANEOUS | Status: DC
  Filled 2012-11-25: qty 0.5

## 2012-11-25 MED ORDER — LANOLIN HYDROUS EX OINT: 1.0000 "application " | TOPICAL_OINTMENT | CUTANEOUS | Status: DC | PRN

## 2012-11-25 MED ORDER — ONDANSETRON HCL 4 MG PO TABS: 4.0000 mg | ORAL_TABLET | ORAL | Status: DC | PRN

## 2012-11-25 MED ORDER — IBUPROFEN 600 MG PO TABS
600.0000 mg | ORAL_TABLET | Freq: Four times a day (QID) | ORAL | Status: DC
  Administered 2012-11-25 – 2012-11-26 (×6): 600 mg via ORAL
  Filled 2012-11-25 (×7): qty 1

## 2012-11-25 MED ORDER — ONDANSETRON HCL 4 MG/2ML IJ SOLN: 4.0000 mg | INTRAMUSCULAR | Status: DC | PRN

## 2012-11-25 MED ORDER — PRENATAL MULTIVITAMIN CH
1.0000 | ORAL_TABLET | Freq: Every day | ORAL | Status: DC
  Administered 2012-11-25 – 2012-11-26 (×2): 1 via ORAL
  Filled 2012-11-25 (×2): qty 1

## 2012-11-25 MED ORDER — SIMETHICONE 80 MG PO CHEW
80.0000 mg | CHEWABLE_TABLET | ORAL | Status: DC | PRN
  Administered 2012-11-26: 80 mg via ORAL

## 2012-11-25 MED ORDER — OXYTOCIN 40 UNITS IN LACTATED RINGERS INFUSION - SIMPLE MED: 62.5000 mL/h | INTRAVENOUS | Status: AC

## 2012-11-25 MED ORDER — SENNOSIDES-DOCUSATE SODIUM 8.6-50 MG PO TABS: 2.0000 | ORAL_TABLET | Freq: Every evening | ORAL | Status: DC | PRN

## 2012-11-25 MED ORDER — OXYCODONE-ACETAMINOPHEN 5-325 MG PO TABS
1.0000 | ORAL_TABLET | ORAL | Status: DC | PRN
  Administered 2012-11-25 – 2012-11-26 (×3): 1 via ORAL
  Filled 2012-11-25 (×4): qty 1

## 2012-11-25 MED ORDER — MENTHOL 3 MG MT LOZG: 1.0000 | LOZENGE | OROMUCOSAL | Status: DC | PRN

## 2012-11-25 MED ORDER — LACTATED RINGERS IV SOLN: INTRAVENOUS | Status: DC

## 2012-11-25 MED ORDER — WITCH HAZEL-GLYCERIN EX PADS
1.0000 "application " | MEDICATED_PAD | CUTANEOUS | Status: DC | PRN
  Administered 2012-11-25: 1 via TOPICAL

## 2012-11-25 MED ORDER — DIBUCAINE 1 % RE OINT
1.0000 "application " | TOPICAL_OINTMENT | RECTAL | Status: DC | PRN
  Administered 2012-11-25: 1 via RECTAL
  Filled 2012-11-25 (×2): qty 28

## 2012-11-25 MED ORDER — SIMETHICONE 80 MG PO CHEW
80.0000 mg | CHEWABLE_TABLET | Freq: Three times a day (TID) | ORAL | Status: DC
  Administered 2012-11-25 (×2): 80 mg via ORAL

## 2012-11-25 MED ORDER — ZOLPIDEM TARTRATE 5 MG PO TABS: 5.0000 mg | ORAL_TABLET | Freq: Every evening | ORAL | Status: DC | PRN

## 2012-11-25 NOTE — Progress Notes (Signed)
Post Partum Day 1 Subjective: Eating, drinking, voiding, ambulating well.  +flatus.  Lochia and pain wnl.  No complaints.   Objective: Blood pressure 107/68, pulse 76, temperature 98 F (36.7 C), temperature source Oral, resp. rate 18, height 5\' 6"  (1.676 m), weight 110.678 kg (244 lb), last menstrual period 02/08/2012, SpO2 100.00%, unknown if currently breastfeeding.  Physical Exam:  General: alert, cooperative and no distress Lochia: appropriate Uterine Fundus: firm Incision: n/a DVT Evaluation: No evidence of DVT seen on physical exam. Negative Homan's sign. No cords or calf tenderness. No significant calf/ankle edema.   Recent Labs  11/24/12 2325 11/25/12 0620  HGB 9.9* 9.5*  HCT 31.0* 29.6*    Assessment/Plan: Plan for discharge tomorrow, Breastfeeding and Lactation consult Plans for interim BTL and OP circumcision at FT   LOS: 2 days   Marge Duncans 11/25/2012, 6:48 AM

## 2012-11-25 NOTE — Anesthesia Postprocedure Evaluation (Signed)
Anesthesia Post Note  Patient: Brooke Espinoza  Procedure(s) Performed: * No procedures listed *  Anesthesia type: Epidural  Patient location: Mother/Baby  Post pain: Pain level controlled  Post assessment: Post-op Vital signs reviewed  Last Vitals:  Filed Vitals:   11/25/12 1645  BP: 108/73  Pulse: 75  Temp: 36.8 C  Resp: 18    Post vital signs: Reviewed  Level of consciousness:alert  Complications: No apparent anesthesia complications

## 2012-11-25 NOTE — Progress Notes (Signed)
At 11/24/12 2240 fundal assessment express about 200 mL of blood. Pt had full bladder. Pt non-symptomatic and stated she "felt fine" Bladder was empty and fundus was firm, non-displaced and one above umbilicus, with small amount of blood expressed upon massage and no active bleeding. Due to pt being a high post-partum hemorrhage risk and total blood loss since delivery over 500 mL Joellyn Haff was called. She ordered cytotec and to have IV pitocin remain running. Appropriate order set placed for stage one PPH.  Pt was transferred to mother baby.

## 2012-11-26 MED ORDER — PRENATAL MULTIVITAMIN CH: 1.0000 | ORAL_TABLET | Freq: Every day | ORAL | Status: DC

## 2012-11-26 MED ORDER — SENNOSIDES-DOCUSATE SODIUM 8.6-50 MG PO TABS: 2.0000 | ORAL_TABLET | Freq: Every evening | ORAL | Status: DC | PRN

## 2012-11-26 MED ORDER — IRON 325 (65 FE) MG PO TABS: ORAL_TABLET | ORAL | Status: DC

## 2012-11-26 MED ORDER — IBUPROFEN 600 MG PO TABS: 600.0000 mg | ORAL_TABLET | Freq: Four times a day (QID) | ORAL | Status: DC

## 2012-11-26 NOTE — Discharge Summary (Signed)
Brooke Espinoza is a 27 y.o. female [phone] who had a viable female baby that was delivered via spontaneous vaginal delivery at 9:20 PM on 11/24/12.     Obstetric Discharge Summary Reason for Admission: induction of labor Prenatal Procedures: none Intrapartum Procedures: spontaneous vaginal delivery Postpartum Procedures: none Complications-Operative and Postpartum: none Hemoglobin  Date Value Range Status  11/25/2012 9.5* 12.0 - 15.0 g/dL Final  4/54/0981 19.1   Final     HCT  Date Value Range Status  11/25/2012 29.6* 36.0 - 46.0 % Final  04/05/2012 40   Final    Physical Exam:  General: alert, cooperative and no distress Lochia: appropriate Uterine Fundus: firm DVT Evaluation: No evidence of DVT seen on physical exam.  Calf/Ankle edema is present.  Discharge Diagnoses: Term Pregnancy-delivered  Discharge Information: Date: 11/26/2012 Activity: pelvic rest Diet: routine Medications: PNV, Ibuprofen, Colace and Iron Condition: stable Instructions: refer to practice specific booklet Discharge to: home   Newborn Data: Live born female  Birth Weight: 10 lb 15.1 oz (4964 g) APGAR: 9, 9  Home with mother.  [name] 11/26/2012, 7:32 AM  I have seen and examined this patient and I agree with the above. [name] 8:10 AM 11/26/2012

## 2012-11-26 NOTE — Lactation Note (Signed)
This note was copied from the chart of Brooke Espinoza. Lactation Consultation Note  Patient Name: Brooke Espinoza [id number] Date: 11/26/2012 Reason for consult: Follow-up assessment   Maternal Data    Feeding   LATCH Score/Interventions                      Lactation Tools Discussed/Used     Consult Status Consult Status: Complete  Mom reports that baby has been nursing better but she is not making enough milk and he is still hungry after nursing. Gave 40 cc's of formula after the last feeding. Mom is experienced BF but had mastitis at 6 [address] with both previous babies and quit BF at that time. Encouraged mom to always BF first to promote milk supply and then give formula if baby is still hungry. Mom has WIC and may get pump from them. Encourage her to get good DEBP. Reviewed OP appointments and BFSG as resources for support after DC. No questions at present. To call prn  Pamelia Hoit 11/26/2012, 8:15 AM

## 2012-11-29 ENCOUNTER — Telehealth: Payer: Self-pay | Admitting: Obstetrics & Gynecology

## 2012-11-30 ENCOUNTER — Ambulatory Visit: Payer: PRIVATE HEALTH INSURANCE | Admitting: Obstetrics & Gynecology

## 2012-12-10 ENCOUNTER — Encounter: Payer: Self-pay | Admitting: Obstetrics & Gynecology

## 2012-12-10 ENCOUNTER — Telehealth: Payer: Self-pay | Admitting: *Deleted

## 2012-12-10 NOTE — Telephone Encounter (Signed)
Pt states was told by someone in our office circumcision could be billed to insurance and remaining balance paid once insurance was filed. Per Dawn in billing, pt would have to pay for circumcision and then would file to insurance, pt would get reimbursed if insurance covered cost. Pt also informed office did not do circumcision of infants greater then 10 lbs. Pt. Verbalized understanding.

## 2012-12-17 ENCOUNTER — Encounter (HOSPITAL_COMMUNITY): Payer: Self-pay | Admitting: Pharmacy Technician

## 2012-12-19 ENCOUNTER — Encounter: Payer: Self-pay | Admitting: Obstetrics and Gynecology

## 2012-12-20 ENCOUNTER — Telehealth: Payer: Self-pay | Admitting: *Deleted

## 2012-12-20 NOTE — Telephone Encounter (Signed)
Spoke with pt stated had already spoke with  Marylene Land in the front office and she explained why appt was rescheduled.

## 2012-12-24 ENCOUNTER — Encounter: Payer: PRIVATE HEALTH INSURANCE | Admitting: Obstetrics and Gynecology

## 2012-12-24 NOTE — Telephone Encounter (Signed)
Done

## 2012-12-26 ENCOUNTER — Encounter (HOSPITAL_COMMUNITY): Payer: Self-pay

## 2012-12-26 ENCOUNTER — Encounter: Payer: Self-pay | Admitting: Advanced Practice Midwife

## 2012-12-26 ENCOUNTER — Ambulatory Visit (INDEPENDENT_AMBULATORY_CARE_PROVIDER_SITE_OTHER): Payer: 59 | Admitting: Advanced Practice Midwife

## 2012-12-26 ENCOUNTER — Other Ambulatory Visit: Payer: Self-pay | Admitting: Obstetrics & Gynecology

## 2012-12-26 ENCOUNTER — Encounter (HOSPITAL_COMMUNITY)
Admission: RE | Admit: 2012-12-26 | Discharge: 2012-12-26 | Disposition: A | Discharge status: Home / Self Care | Payer: PRIVATE HEALTH INSURANCE | Source: Ambulatory Visit | Attending: Obstetrics and Gynecology | Admitting: Obstetrics and Gynecology

## 2012-12-26 LAB — CBC
HCT: 39.1 % (ref 36.0–46.0)
Hemoglobin: 13.1 g/dL (ref 12.0–15.0)
MCV: 77.3 fL — ABNORMAL LOW (ref 78.0–100.0)
Platelets: 217 10*3/uL (ref 150–400)
RBC: 5.06 MIL/uL (ref 3.87–5.11)
WBC: 6.1 10*3/uL (ref 4.0–10.5)

## 2012-12-26 LAB — URINALYSIS, ROUTINE W REFLEX MICROSCOPIC
Glucose, UA: NEGATIVE mg/dL
Specific Gravity, Urine: 1.025 (ref 1.005–1.030)
Urobilinogen, UA: 0.2 mg/dL (ref 0.0–1.0)
pH: 6 (ref 5.0–8.0)

## 2012-12-26 LAB — SURGICAL PCR SCREEN
MRSA, PCR: NEGATIVE
Staphylococcus aureus: NEGATIVE

## 2012-12-26 LAB — URINE MICROSCOPIC-ADD ON

## 2012-12-26 MED ORDER — METOCLOPRAMIDE HCL 10 MG PO TABS: 10.0000 mg | ORAL_TABLET | Freq: Three times a day (TID) | ORAL | Status: DC

## 2012-12-26 NOTE — Patient Instructions (Addendum)
Brooke Espinoza  12/26/2012   Your procedure is scheduled on:   01/02/2012   Report to [facility] at  720  AM.  Call this number if you have problems the morning of surgery: [phone]   Remember:   Do not eat food or drink liquids after midnight.   Take these medicines the morning of surgery with A SIP OF WATER:  none   Do not wear jewelry, make-up or nail polish.  Do not wear lotions, powders, or perfumes.   Do not shave 48 hours prior to surgery. Men may shave face and neck.  Do not bring valuables to the hospital.  [facility] is not responsible  for any belongings or valuables.  Contacts, dentures or bridgework may not be worn into surgery.  Leave suitcase in the car. After surgery it may be brought to your room.  For patients admitted to the hospital, checkout time is 11:00 AM the day of discharge.   Patients discharged the day of surgery will not be allowed to drive  home.  Name and phone number of your driver: family  Special Instructions: Shower using CHG 2 nights before surgery and the night before surgery.  If you shower the day of surgery use CHG.  Use special wash - you have one bottle of CHG for all showers.  You should use approximately 1/3 of the bottle for each shower.   Please read over the following fact sheets that you were given: Pain Booklet, Coughing and Deep Breathing, MRSA Information, Surgical Site Infection Prevention, Anesthesia Post-op Instructions and Care and Recovery After Surgery Tubal Ligation Care After Please read the instructions outlined below and refer to this sheet in the next few weeks. These discharge instructions give you general information on care after leaving the hospital. Your surgeon may also give you specific instructions. While treatment has been planned according to the most current medical practices available, unavoidable complications sometimes happen. If you have any problems or questions after discharge, please call your surgeon  or caregiver. HOME CARE INSTRUCTIONS  Do not drink alcohol, drive a car, use public transportation, or sign important papers for at least 1 day following surgery.   Only take over-the-counter or prescription medicines for pain, discomfort, or fever as directed by your caregiver.   If your surgery was done as a same-day surgery, make sure you have a responsible adult with you for the first 24 hours following your surgery.   You may resume normal diet and activities as directed.   Change dressings as directed.   Do not douche or engage in sexual intercourse until your surgeon has given you permission.  SEEK MEDICAL CARE IF:   You have redness, swelling, or increasing pain in the wound or wound area.   You have pus coming from the wound.   You have a fever.   You have a bad smell coming from a wound or dressing.   Your wound edges separate after sutures or staples have been removed.   You have increasing abdominal pain.   You have excessive vaginal bleeding.   You have increasing pain in your shoulders or chest.   You have dizzy episodes or fainting while standing.   You have persistent nausea or vomiting.  SEEK IMMEDIATE MEDICAL CARE IF:   You have a rash.   You have a hard time breathing.   You feel you are developing any reaction or side effects to medications taken.  MAKE SURE  YOU:   Understand these instructions.   Will watch this condition.   Will get help right away if you are not doing well or get worse.  Document Released: 04/01/2004 Document Revised: 06/16/2011 Document Reviewed: 09/20/2007 [facility] Patient Information 2012 [place], [place].PATIENT INSTRUCTIONS POST-ANESTHESIA  IMMEDIATELY FOLLOWING SURGERY:  Do not drive or operate machinery for the first twenty four hours after surgery.  Do not make any important decisions for twenty four hours after surgery or while taking narcotic pain medications or sedatives.  If you develop intractable nausea and  vomiting or a severe headache please notify your doctor immediately.  FOLLOW-UP:  Please make an appointment with your surgeon as instructed. You do not need to follow up with anesthesia unless specifically instructed to do so.  WOUND CARE INSTRUCTIONS (if applicable):  Keep a dry clean dressing on the anesthesia/puncture wound site if there is drainage.  Once the wound has quit draining you may leave it open to air.  Generally you should leave the bandage intact for twenty four hours unless there is drainage.  If the epidural site drains for more than 36-48 hours please call the anesthesia department.  QUESTIONS?:  Please feel free to call your physician or the hospital operator if you have any questions, and they will be happy to assist you.

## 2012-12-26 NOTE — Progress Notes (Signed)
Brooke Espinoza is a 28 y.o. who presents for a postpartum visit. She is 6 weeks postpartum following a spontaneous vaginal delivery of a 10# 15.1 oz baby with a fx clavical. (states there was not a dystocia). I have fully reviewed the prenatal and intrapartum course. The delivery was at 39 gestational weeks, IOL for A2DM.  Anesthesia: epidural. Postpartum course has been uncomplicated. Baby's course has been uneventful, bone is healed. Baby is feeding by breast.  Supply is low Has tried fenugreek, mother's tea, pumping after feedings.  Wants to try reglan.   Bleeding: staining only. Bowel function is normal. Bladder function is normal. Patient is not sexually active. Contraception method is tubal ligation. Postpartum depression screening: negative. FBS in 70's ~ 7 times since birth. BP 100/80  Wt 216 lb (97.977 kg)  BMI 34.88 kg/m2  Review of Systems Pertinent items are noted in HPI.   Objective:    There were no vitals taken for this visit.  General:  alert, cooperative and no distress   Breasts:  negative  Lungs: clear to auscultation bilaterally  Heart:  regular rate and rhythm  Abdomen: Soft, nontender   Vulva:  normal  Vagina: normal vagina  Cervix:  closed  Corpus: Well involuted     Rectal Exam: no hemorrhoids        Assessment:    normal postpartum exam.  Plan:    1. Contraception: tubal ligation 2. Follow up in: tuesday for BTL withJVF

## 2012-12-28 ENCOUNTER — Encounter: Payer: PRIVATE HEALTH INSURANCE | Admitting: Obstetrics and Gynecology

## 2012-12-31 ENCOUNTER — Encounter: Payer: Self-pay | Admitting: Obstetrics and Gynecology

## 2012-12-31 ENCOUNTER — Ambulatory Visit (INDEPENDENT_AMBULATORY_CARE_PROVIDER_SITE_OTHER): Payer: 59 | Admitting: Obstetrics and Gynecology

## 2012-12-31 VITALS — BP 112/70 | Ht 67.0 in | Wt 213.6 lb

## 2012-12-31 DIAGNOSIS — Z01818 Encounter for other preprocedural examination: Secondary | ICD-10-CM

## 2012-12-31 DIAGNOSIS — Z302 Encounter for sterilization: Secondary | ICD-10-CM

## 2012-12-31 NOTE — H&P (Addendum)
  Brooke Espinoza is a 27 y.o. who presents for a postpartum visit. She is 6 weeks postpartum following a spontaneous vaginal delivery of a 10# 15.1 oz baby with a fx clavical. (states there was not a dystocia). At this time the scrotum is admitted for postpartum tubal ligation. She has signed consents during the pregnancy and been seen by me 12/31/2012 where she again confirmed her desire for permanent sterilization. She is a gravida 3 para 3. We talked about surgical technique, tubal ligation by clip versus salpingectomy. Based on the most recent recommendations of the GYN oncology community that salpingectomy would be performed when possible, the patient desires treatment by bilateral salpingectomy. Risks of procedure been reviewed including adhesions, injury to adjacent organs or post surgical bleeding. The theoretical benefit of reducing potential for malignant change of cells coming from the fallopian tube has been discussed and therefore patient desires salpingectomy  Cathie Beams has fully reviewed the prenatal and intrapartum course. The delivery was at 39 gestational weeks, IOL for A2DM. Anesthesia: epidural. Postpartum course has been uncomplicated. Baby's course has been uneventful, bone is healed. Baby is feeding by breast. Supply is low Has tried fenugreek, mother's tea, pumping after feedings. Wants to try reglan. Bleeding: staining only. Bowel function is normal. Bladder function is normal. Patient is not sexually active. Contraception method is tubal ligation. Postpartum depression screening: negative.  FBS in 70's ~ 7 times since birth.  BP 100/80  Wt 216 lb (97.977 kg)  BMI 34.88 kg/m2  Review of Systems  Pertinent items are noted in HPI.  Objective:   There were no vitals taken for this visit. Blood pressure 112/70, height 5\' 7"  (1.702 m), weight 213 lb 9.6 oz (96.888 kg), last menstrual period 12/20/2012, currently breastfeeding. General:  alert, cooperative and no distress    Breasts:  negative   Lungs:  clear to auscultation bilaterally   Heart:  regular rate and rhythm   Abdomen:  Soft, nontender   Vulva:  normal   Vagina:  normal vagina   Cervix:  closed   Corpus:  Well involuted      Rectal Exam:  no hemorrhoids    Assessment:   normal postpartum exam.  Plan:   1. Contraception: tubal sterilization by bilateral salpingectomy

## 2013-01-01 ENCOUNTER — Encounter: Payer: Self-pay | Admitting: Obstetrics and Gynecology

## 2013-01-01 ENCOUNTER — Encounter (HOSPITAL_COMMUNITY): Payer: Self-pay | Admitting: Anesthesiology

## 2013-01-01 ENCOUNTER — Ambulatory Visit (HOSPITAL_COMMUNITY)
Admission: RE | Admit: 2013-01-01 | Discharge: 2013-01-01 | Disposition: A | Discharge status: Home / Self Care | Payer: PRIVATE HEALTH INSURANCE | Source: Ambulatory Visit | Attending: Obstetrics and Gynecology | Admitting: Obstetrics and Gynecology

## 2013-01-01 ENCOUNTER — Encounter (HOSPITAL_COMMUNITY): Payer: Self-pay | Admitting: *Deleted

## 2013-01-01 ENCOUNTER — Ambulatory Visit (HOSPITAL_COMMUNITY): Payer: PRIVATE HEALTH INSURANCE | Admitting: Anesthesiology

## 2013-01-01 ENCOUNTER — Encounter (HOSPITAL_COMMUNITY)
Admission: RE | Disposition: A | Discharge status: Home / Self Care | Payer: Self-pay | Source: Ambulatory Visit | Attending: Obstetrics and Gynecology

## 2013-01-01 ENCOUNTER — Other Ambulatory Visit: Payer: Self-pay | Admitting: Obstetrics and Gynecology

## 2013-01-01 DIAGNOSIS — Z302 Encounter for sterilization: Secondary | ICD-10-CM

## 2013-01-01 DIAGNOSIS — Z8632 Personal history of gestational diabetes: Secondary | ICD-10-CM | POA: Insufficient documentation

## 2013-01-01 DIAGNOSIS — Z01812 Encounter for preprocedural laboratory examination: Secondary | ICD-10-CM | POA: Insufficient documentation

## 2013-01-01 HISTORY — PX: LAPAROSCOPIC BILATERAL SALPINGECTOMY: SHX5889

## 2013-01-01 SURGERY — SALPINGECTOMY, BILATERAL, LAPAROSCOPIC
Anesthesia: General | Site: Abdomen | Laterality: Bilateral | Wound class: Clean

## 2013-01-01 MED ORDER — BUPIVACAINE HCL (PF) 0.25 % IJ SOLN
INTRAMUSCULAR | Status: AC
  Filled 2013-01-01: qty 30

## 2013-01-01 MED ORDER — FENTANYL CITRATE 0.05 MG/ML IJ SOLN
25.0000 ug | INTRAMUSCULAR | Status: DC | PRN
  Administered 2013-01-01 (×2): 50 ug via INTRAVENOUS

## 2013-01-01 MED ORDER — OXYCODONE-ACETAMINOPHEN 5-325 MG PO TABS: 1.0000 | ORAL_TABLET | ORAL | Status: DC | PRN

## 2013-01-01 MED ORDER — HYDROCODONE-ACETAMINOPHEN 5-325 MG PO TABS: 1.0000 | ORAL_TABLET | Freq: Four times a day (QID) | ORAL | Status: DC | PRN

## 2013-01-01 MED ORDER — LIDOCAINE HCL (PF) 1 % IJ SOLN
INTRAMUSCULAR | Status: AC
  Filled 2013-01-01: qty 5

## 2013-01-01 MED ORDER — ROCURONIUM BROMIDE 100 MG/10ML IV SOLN
INTRAVENOUS | Status: DC | PRN
  Administered 2013-01-01: 20 mg via INTRAVENOUS

## 2013-01-01 MED ORDER — NEOSTIGMINE METHYLSULFATE 1 MG/ML IJ SOLN
INTRAMUSCULAR | Status: DC | PRN
  Administered 2013-01-01: 4 mg via INTRAVENOUS

## 2013-01-01 MED ORDER — PROPOFOL 10 MG/ML IV EMUL
INTRAVENOUS | Status: AC
  Filled 2013-01-01: qty 20

## 2013-01-01 MED ORDER — MIDAZOLAM HCL 2 MG/2ML IJ SOLN
INTRAMUSCULAR | Status: AC
  Filled 2013-01-01: qty 2

## 2013-01-01 MED ORDER — FENTANYL CITRATE 0.05 MG/ML IJ SOLN
INTRAMUSCULAR | Status: DC | PRN
  Administered 2013-01-01 (×3): 50 ug via INTRAVENOUS
  Administered 2013-01-01: 100 ug via INTRAVENOUS

## 2013-01-01 MED ORDER — ONDANSETRON HCL 4 MG/2ML IJ SOLN
4.0000 mg | Freq: Once | INTRAMUSCULAR | Status: AC | PRN
  Administered 2013-01-01: 4 mg via INTRAVENOUS

## 2013-01-01 MED ORDER — ONDANSETRON HCL 4 MG/2ML IJ SOLN
INTRAMUSCULAR | Status: AC
  Filled 2013-01-01: qty 2

## 2013-01-01 MED ORDER — LACTATED RINGERS IV SOLN
INTRAVENOUS | Status: DC
  Administered 2013-01-01: 1000 mL via INTRAVENOUS

## 2013-01-01 MED ORDER — LACTATED RINGERS IV SOLN
INTRAVENOUS | Status: DC | PRN
  Administered 2013-01-01 (×2): via INTRAVENOUS

## 2013-01-01 MED ORDER — ARTIFICIAL TEARS OP OINT
TOPICAL_OINTMENT | OPHTHALMIC | Status: AC
  Filled 2013-01-01: qty 3.5

## 2013-01-01 MED ORDER — ONDANSETRON HCL 4 MG/2ML IJ SOLN
4.0000 mg | Freq: Once | INTRAMUSCULAR | Status: AC
  Administered 2013-01-01: 4 mg via INTRAVENOUS

## 2013-01-01 MED ORDER — ROCURONIUM BROMIDE 50 MG/5ML IV SOLN
INTRAVENOUS | Status: AC
  Filled 2013-01-01: qty 1

## 2013-01-01 MED ORDER — 0.9 % SODIUM CHLORIDE (POUR BTL) OPTIME
TOPICAL | Status: DC | PRN
  Administered 2013-01-01: 1000 mL

## 2013-01-01 MED ORDER — KETOROLAC TROMETHAMINE 30 MG/ML IJ SOLN
30.0000 mg | Freq: Once | INTRAMUSCULAR | Status: AC
  Administered 2013-01-01: 30 mg via INTRAVENOUS

## 2013-01-01 MED ORDER — PROPOFOL 10 MG/ML IV BOLUS
INTRAVENOUS | Status: DC | PRN
  Administered 2013-01-01: 150 mg via INTRAVENOUS

## 2013-01-01 MED ORDER — SUCCINYLCHOLINE CHLORIDE 20 MG/ML IJ SOLN
INTRAMUSCULAR | Status: AC
Start: 2013-01-01 — End: 2013-01-01
  Filled 2013-01-01: qty 1

## 2013-01-01 MED ORDER — FENTANYL CITRATE 0.05 MG/ML IJ SOLN
INTRAMUSCULAR | Status: AC
  Filled 2013-01-01: qty 5

## 2013-01-01 MED ORDER — SUCCINYLCHOLINE CHLORIDE 20 MG/ML IJ SOLN
INTRAMUSCULAR | Status: DC | PRN
  Administered 2013-01-01: 100 mg via INTRAVENOUS

## 2013-01-01 MED ORDER — GLYCOPYRROLATE 0.2 MG/ML IJ SOLN
INTRAMUSCULAR | Status: AC
  Filled 2013-01-01: qty 3

## 2013-01-01 MED ORDER — KETOROLAC TROMETHAMINE 30 MG/ML IJ SOLN
INTRAMUSCULAR | Status: AC
  Filled 2013-01-01: qty 1

## 2013-01-01 MED ORDER — BUPIVACAINE HCL (PF) 0.5 % IJ SOLN
INTRAMUSCULAR | Status: DC | PRN
  Administered 2013-01-01: 10 mL

## 2013-01-01 MED ORDER — MIDAZOLAM HCL 2 MG/2ML IJ SOLN
1.0000 mg | INTRAMUSCULAR | Status: DC | PRN
  Administered 2013-01-01 (×2): 2 mg via INTRAVENOUS

## 2013-01-01 MED ORDER — FENTANYL CITRATE 0.05 MG/ML IJ SOLN
INTRAMUSCULAR | Status: AC
  Filled 2013-01-01: qty 2

## 2013-01-01 MED ORDER — GLYCOPYRROLATE 0.2 MG/ML IJ SOLN
INTRAMUSCULAR | Status: DC | PRN
  Administered 2013-01-01: 0.6 mg via INTRAVENOUS

## 2013-01-01 SURGICAL SUPPLY — 35 items
BAG HAMPER (MISCELLANEOUS) ×2 IMPLANT
BARRIER ADHS 3X4 INTERCEED (GAUZE/BANDAGES/DRESSINGS) IMPLANT
CATH ROBINSON RED A/P 14FR (CATHETERS) ×2 IMPLANT
CLOTH BEACON ORANGE TIMEOUT ST (SAFETY) ×2 IMPLANT
DERMABOND ADVANCED (GAUZE/BANDAGES/DRESSINGS) ×1
DERMABOND ADVANCED .7 DNX12 (GAUZE/BANDAGES/DRESSINGS) ×1 IMPLANT
DRESSING COVERLET 3X1 FLEXIBLE (GAUZE/BANDAGES/DRESSINGS) ×6 IMPLANT
GLOVE BIO SURGEON ST LM GN SZ9 (GLOVE) ×2 IMPLANT
GLOVE BIOGEL PI IND STRL 7.0 (GLOVE) ×1 IMPLANT
GLOVE BIOGEL PI IND STRL 7.5 (GLOVE) ×2 IMPLANT
GLOVE BIOGEL PI IND STRL 9 (GLOVE) ×2 IMPLANT
GLOVE BIOGEL PI INDICATOR 7.0 (GLOVE) ×1
GLOVE BIOGEL PI INDICATOR 7.5 (GLOVE) ×2
GLOVE BIOGEL PI INDICATOR 9 (GLOVE) ×2
GLOVE ECLIPSE 6.5 STRL STRAW (GLOVE) ×2 IMPLANT
GOWN STRL REIN 3XL LVL4 (GOWN DISPOSABLE) ×2 IMPLANT
GOWN STRL REIN XL XLG (GOWN DISPOSABLE) ×2 IMPLANT
NEEDLE INSUFFLATION 120MM (ENDOMECHANICALS) ×2 IMPLANT
NS IRRIG 1000ML POUR BTL (IV SOLUTION) ×2 IMPLANT
PACK PERI GYN (CUSTOM PROCEDURE TRAY) ×2 IMPLANT
POUCH SPECIMEN RETRIEVAL 10MM (ENDOMECHANICALS) IMPLANT
SCALPEL HARMONIC ACE (MISCELLANEOUS) IMPLANT
SET BASIN LINEN APH (SET/KITS/TRAYS/PACK) ×2 IMPLANT
SET IRRIG TUBING LAPAROSCOPIC (IRRIGATION / IRRIGATOR) IMPLANT
SLEEVE Z-THREAD 5X100MM (TROCAR) IMPLANT
STRIP CLOSURE SKIN 1/2X4 (GAUZE/BANDAGES/DRESSINGS) ×2 IMPLANT
SUT VIC AB 4-0 PS2 27 (SUTURE) ×2 IMPLANT
SUT VICRYL 0 UR6 27IN ABS (SUTURE) ×4 IMPLANT
TRAY FOLEY CATH 14FR (SET/KITS/TRAYS/PACK) IMPLANT
TROCAR OPTI TIP 5M 100M (ENDOMECHANICALS) ×4 IMPLANT
TROCAR XCEL DIL TIP R 11M (ENDOMECHANICALS) ×2 IMPLANT
TROCAR Z-THAD FIOS HNDL 12X100 (TROCAR) ×2 IMPLANT
TROCAR Z-THRD FIOS HNDL 11X100 (TROCAR) ×2 IMPLANT
TROCAR Z-THREAD FIOS 5X100MM (TROCAR) ×2 IMPLANT
WATER STERILE IRR 1000ML POUR (IV SOLUTION) ×2 IMPLANT

## 2013-01-01 NOTE — Brief Op Note (Signed)
01/01/2013  12:48 PM  PATIENT:  Brooke Espinoza  26 y.o. female  PRE-OPERATIVE DIAGNOSIS:  sterilization request  POST-OPERATIVE DIAGNOSIS:  sterilization request  PROCEDURE:  Procedure(s): LAPAROSCOPIC BILATERAL SALPINGECTOMY (Bilateral)  SURGEON:  Surgeon(s) and Role:    * Tilda Burrow, MD - Primary  PHYSICIAN ASSISTANT:   ASSISTANTS: Marya Landry CST   ANESTHESIA:   local and general  EBL:  Total I/O In: 1000 [I.V.:1000] Out: 165 [Urine:160; Blood:5]  BLOOD ADMINISTERED:none  DRAINS: none   LOCAL MEDICATIONS USED:  MARCAINE    and Amount: 10 ml  SPECIMEN:  Source of Specimen:  Bilateral fallopian tube  DISPOSITION OF SPECIMEN:  PATHOLOGY  COUNTS:  YES  TOURNIQUET:  * No tourniquets in log *  DICTATION: .Dragon Dictation  PLAN OF CARE: Discharge to home after PACU  PATIENT DISPOSITION:  PACU - hemodynamically stable.   Delay start of Pharmacological VTE agent (>24hrs) due to surgical blood loss or risk of bleeding: not applicable

## 2013-01-01 NOTE — H&P (View-Only) (Signed)
  Brooke Espinoza is a 27 y.o. who presents for a postpartum visit. She is 6 weeks postpartum following a spontaneous vaginal delivery of a 10# 15.1 oz baby with a fx clavical. (states there was not a dystocia). At this time the scrotum is admitted for postpartum tubal ligation. She has signed consents during the pregnancy and been seen by me 12/31/2012 where she again confirmed her desire for permanent sterilization. She is a gravida 3 para 3. We talked about surgical technique, tubal ligation by clip versus salpingectomy. Based on the most recent recommendations of the GYN oncology community that salpingectomy would be performed when possible, the patient desires treatment by bilateral salpingectomy. Risks of procedure been reviewed including adhesions, injury to adjacent organs or post surgical bleeding. The theoretical benefit of reducing potential for malignant change of cells coming from the fallopian tube has been discussed and therefore patient desires salpingectomy  Fran Cresenzo-Dishmon has fully reviewed the prenatal and intrapartum course. The delivery was at 39 gestational weeks, IOL for A2DM. Anesthesia: epidural. Postpartum course has been uncomplicated. Baby's course has been uneventful, bone is healed. Baby is feeding by breast. Supply is low Has tried fenugreek, mother's tea, pumping after feedings. Wants to try reglan. Bleeding: staining only. Bowel function is normal. Bladder function is normal. Patient is not sexually active. Contraception method is tubal ligation. Postpartum depression screening: negative.  FBS in 70's ~ 7 times since birth.  BP 100/80  Wt 216 lb (97.977 kg)  BMI 34.88 kg/m2  Review of Systems  Pertinent items are noted in HPI.  Objective:   There were no vitals taken for this visit. Blood pressure 112/70, height 5' 7" (1.702 m), weight 213 lb 9.6 oz (96.888 kg), last menstrual period 12/20/2012, currently breastfeeding. General:  alert, cooperative and no distress    Breasts:  negative   Lungs:  clear to auscultation bilaterally   Heart:  regular rate and rhythm   Abdomen:  Soft, nontender   Vulva:  normal   Vagina:  normal vagina   Cervix:  closed   Corpus:  Well involuted      Rectal Exam:  no hemorrhoids    Assessment:   normal postpartum exam.  Plan:   1. Contraception: tubal sterilization by bilateral salpingectomy   

## 2013-01-01 NOTE — Progress Notes (Signed)
The patient was seen 6/23 in the office as a preop evaluation. The method is included in epic as a "H&P" from 2:30 PM on 6/23 was actually the documentation of the preop visit performed at that time. Below it as a copy of that note;  Brooke Espinoza is a 27 y.o. who presents for a postpartum visit. She is 6 weeks postpartum following a spontaneous vaginal delivery of a 10# 15.1 oz baby with a fx clavical. (states there was not a dystocia).  At this time the patient is scheduled to be admitted for postpartum tubal ligation. She has signed consents during the pregnancy and been seen by me 12/31/2012 where she again confirmed her desire for permanent sterilization. She is a gravida 3 para 3. We talked about surgical technique, tubal ligation by clip versus salpingectomy. Based on the most recent recommendations of the GYN oncology community that salpingectomy would be performed when possible, the patient desires treatment by bilateral salpingectomy. Risks of procedure been reviewed including adhesions, injury to adjacent organs or post surgical bleeding. The theoretical benefit of reducing potential for malignant change of cells coming from the fallopian tube has been discussed and therefore patient desires salpingectomy  Cathie Beams has fully reviewed the prenatal and intrapartum course. The delivery was at 39 gestational weeks, IOL for A2DM. Anesthesia: epidural. Postpartum course has been uncomplicated. Baby's course has been uneventful, bone is healed. Baby is feeding by breast. Supply is low Has tried fenugreek, mother's tea, pumping after feedings. Wants to try reglan. Bleeding: staining only. Bowel function is normal. Bladder function is normal. Patient is not sexually active. Contraception method is tubal ligation. Postpartum depression screening: negative.  FBS in 70's ~ 7 times since birth.  BP 100/80  Wt 216 lb (97.977 kg)  BMI 34.88 kg/m2  Review of Systems  Pertinent items are noted in  HPI.  Objective:   There were no vitals taken for this visit. Blood pressure 112/70, height 5\' 7"  (1.702 m), weight 213 lb 9.6 oz (96.888 kg), last menstrual period 12/20/2012, currently breastfeeding.  General:  alert, cooperative and no distress   Breasts:  negative   Lungs:  clear to auscultation bilaterally   Heart:  regular rate and rhythm   Abdomen:  Soft, nontender   Vulva:  normal   Vagina:  normal vagina   Cervix:  closed   Corpus:  Well involuted      Rectal Exam:  no hemorrhoids    Assessment:   normal postpartum exam.  Plan:   1. Contraception: tubal sterilization by bilateral salpingectomy

## 2013-01-01 NOTE — Anesthesia Preprocedure Evaluation (Signed)
Anesthesia Evaluation  Patient identified by MRN, date of birth, ID band Patient awake    Reviewed: Allergy & Precautions, H&P , NPO status , Patient's Chart, lab work & pertinent test results  History of Anesthesia Complications Negative for: history of anesthetic complications  Airway Mallampati: I TM Distance: >3 FB Neck ROM: Full    Dental  (+) Teeth Intact   Pulmonary asthma (childhood ) ,  breath sounds clear to auscultation        Cardiovascular negative cardio ROS  Rhythm:Regular Rate:Normal     Neuro/Psych    GI/Hepatic   Endo/Other  diabetes (gestational)  Renal/GU      Musculoskeletal   Abdominal   Peds  Hematology   Anesthesia Other Findings   Reproductive/Obstetrics                           Anesthesia Physical Anesthesia Plan  ASA: II  Anesthesia Plan: General   Post-op Pain Management:    Induction: Intravenous  Airway Management Planned: Oral ETT  Additional Equipment:   Intra-op Plan:   Post-operative Plan: Extubation in OR  Informed Consent: I have reviewed the patients History and Physical, chart, labs and discussed the procedure including the risks, benefits and alternatives for the proposed anesthesia with the patient or authorized representative who has indicated his/her understanding and acceptance.     Plan Discussed with:   Anesthesia Plan Comments:         Anesthesia Quick Evaluation

## 2013-01-01 NOTE — Anesthesia Postprocedure Evaluation (Signed)
  Anesthesia Post-op Note  Patient: Brooke Espinoza  Procedure(s) Performed: Procedure(s): LAPAROSCOPIC BILATERAL SALPINGECTOMY (Bilateral)  Patient Location: PACU  Anesthesia Type:General  Level of Consciousness: sedated and patient cooperative  Airway and Oxygen Therapy: Patient Spontanous Breathing and Patient connected to face mask oxygen  Post-op Pain: mild  Post-op Assessment: Post-op Vital signs reviewed, Patient's Cardiovascular Status Stable, Respiratory Function Stable, Patent Airway, No signs of Nausea or vomiting and Pain level controlled  Post-op Vital Signs: Reviewed and stable  Complications: No apparent anesthesia complications

## 2013-01-01 NOTE — Preoperative (Signed)
Beta Blockers   Reason not to administer Beta Blockers:Not Applicable 

## 2013-01-01 NOTE — Transfer of Care (Signed)
Immediate Anesthesia Transfer of Care Note  Patient: Brooke Espinoza  Procedure(s) Performed: Procedure(s): LAPAROSCOPIC BILATERAL SALPINGECTOMY (Bilateral)  Patient Location: PACU  Anesthesia Type:General  Level of Consciousness: awake, alert , oriented and patient cooperative  Airway & Oxygen Therapy: Patient Spontanous Breathing and Patient connected to face mask oxygen  Post-op Assessment: Report given to PACU RN and Post -op Vital signs reviewed and stable  Post vital signs: Reviewed and stable  Complications: No apparent anesthesia complications

## 2013-01-01 NOTE — Addendum Note (Signed)
Addendum created 01/01/13 1305 by Marolyn Hammock, CRNA   Modules edited: Anesthesia Medication Administration

## 2013-01-01 NOTE — Op Note (Signed)
  PATIENT:  Brooke Espinoza  27 y.o. female  PRE-OPERATIVE DIAGNOSIS:  sterilization request  POST-OPERATIVE DIAGNOSIS:  sterilization request  PROCEDURE:  Procedure(s): LAPAROSCOPIC BILATERAL SALPINGECTOMY (Bilateral)  SURGEON:  Surgeon(s) and Role:    * Tilda Burrow, MD - Primary  Details of procedure: Patient was taken to the operating room prepped and draped for combined abdominal and vaginal procedure with in and out catheterization of the bladder, 55 cc urine obtained, after timeout conducted. Hulka tenaculum was attached the cervix and attention taken to the umbilicus where an infraumbilical vertical 1 cm skin incision was made as well as a transverse suprapubic incision. A right lower quadrant incision of similar length was also performed. Attention was then directed to the umbilicus, Veress needle introduced pneumoperitoneum easily achieved under 6 mm mercury pressure with water droplet technique used to confirm intraperitoneal location. The 5 mm laparoscopic trocar was easily inserted, and the pelvis inspected and confirmed as hemostatic and normal in appearance, without adhesions. The suprapubic trocar and right lower quadrant trocar were placed under direct laparoscopic visualization, then attention directed to the left fallopian tube which was grasped and the unipolar hook cautery used to transect the tube off from the mesosalpinx, beginning at the cornu of the uterus and going to the tip of the tube with good hemostasis and careful attention to staying well away from all adjacent structures. The right tube was treated in a similar fashion but began at the distal portion, and then proceeding proximally to the tubal cornu, with good hemostasis. Both specimens were extracted and sent as a specimen Saline was instilled 120 cc, abdomen deflated, and cuticular 4-0 Vicryl used to close the 3 trocar sites. Steri-Strips were applied and patient to recovery room in stable condition.

## 2013-01-01 NOTE — Anesthesia Procedure Notes (Signed)
Procedure Name: Intubation Date/Time: 01/01/2013 12:01 PM Performed by: Carolyne Littles, Mayli Covington L Pre-anesthesia Checklist: Patient identified, Patient being monitored, Timeout performed, Emergency Drugs available and Suction available Patient Re-evaluated:Patient Re-evaluated prior to inductionOxygen Delivery Method: Circle System Utilized Preoxygenation: Pre-oxygenation with 100% oxygen Intubation Type: IV induction, Rapid sequence and Cricoid Pressure applied Laryngoscope Size: 3 and Miller Grade View: Grade I Tube type: Oral Tube size: 7.0 mm Number of attempts: 1 Airway Equipment and Method: stylet Placement Confirmation: ETT inserted through vocal cords under direct vision,  positive ETCO2 and breath sounds checked- equal and bilateral Secured at: 21 cm Tube secured with: Tape Dental Injury: Teeth and Oropharynx as per pre-operative assessment

## 2013-01-01 NOTE — Interval H&P Note (Signed)
History and Physical Interval Note:  01/01/2013 11:49 AM  Brooke Espinoza  has presented today for surgery, with the diagnosis of sterilization request  The various methods of treatment have been discussed with the patient and family. After consideration of risks, benefits and other options for treatment, the patient has consented to  Procedure(s): LAPAROSCOPIC BILATERAL SALPINGECTOMY (Bilateral) as a surgical intervention .  The patient's history has been reviewed, patient examined, no change in status, stable for surgery.  I have reviewed the patient's chart and labs.  Questions were answered to the patient's satisfaction.     Tilda Burrow

## 2013-01-02 ENCOUNTER — Encounter (HOSPITAL_COMMUNITY): Payer: Self-pay | Admitting: Obstetrics and Gynecology

## 2013-01-02 ENCOUNTER — Encounter: Payer: PRIVATE HEALTH INSURANCE | Admitting: Obstetrics and Gynecology

## 2013-01-03 ENCOUNTER — Ambulatory Visit: Payer: PRIVATE HEALTH INSURANCE | Admitting: Obstetrics and Gynecology

## 2013-01-14 ENCOUNTER — Encounter: Payer: PRIVATE HEALTH INSURANCE | Admitting: Obstetrics and Gynecology

## 2013-01-17 ENCOUNTER — Encounter: Payer: PRIVATE HEALTH INSURANCE | Admitting: Obstetrics and Gynecology

## 2013-01-31 ENCOUNTER — Encounter: Payer: PRIVATE HEALTH INSURANCE | Admitting: Obstetrics and Gynecology

## 2013-01-31 ENCOUNTER — Encounter: Payer: Self-pay | Admitting: *Deleted

## 2013-04-16 ENCOUNTER — Telehealth: Payer: Self-pay | Admitting: Family Medicine

## 2013-05-16 ENCOUNTER — Other Ambulatory Visit: Payer: Self-pay

## 2013-12-20 ENCOUNTER — Ambulatory Visit (HOSPITAL_BASED_OUTPATIENT_CLINIC_OR_DEPARTMENT_OTHER): Payer: PRIVATE HEALTH INSURANCE | Attending: Otolaryngology

## 2014-05-12 ENCOUNTER — Encounter (HOSPITAL_COMMUNITY): Payer: Self-pay | Admitting: Obstetrics and Gynecology

## 2014-09-03 ENCOUNTER — Encounter (HOSPITAL_COMMUNITY): Payer: Self-pay | Admitting: Emergency Medicine

## 2014-09-03 ENCOUNTER — Emergency Department (HOSPITAL_COMMUNITY): Payer: Medicaid Other

## 2014-09-03 ENCOUNTER — Emergency Department (HOSPITAL_COMMUNITY)
Admission: EM | Admit: 2014-09-03 | Discharge: 2014-09-03 | Disposition: A | Discharge status: Home / Self Care | Payer: Medicaid Other | Attending: Emergency Medicine | Admitting: Emergency Medicine

## 2014-09-03 DIAGNOSIS — Z8632 Personal history of gestational diabetes: Secondary | ICD-10-CM | POA: Insufficient documentation

## 2014-09-03 DIAGNOSIS — J45909 Unspecified asthma, uncomplicated: Secondary | ICD-10-CM | POA: Insufficient documentation

## 2014-09-03 DIAGNOSIS — B349 Viral infection, unspecified: Secondary | ICD-10-CM | POA: Insufficient documentation

## 2014-09-03 DIAGNOSIS — Z79899 Other long term (current) drug therapy: Secondary | ICD-10-CM | POA: Insufficient documentation

## 2014-09-03 DIAGNOSIS — Z8669 Personal history of other diseases of the nervous system and sense organs: Secondary | ICD-10-CM | POA: Insufficient documentation

## 2014-09-03 HISTORY — DX: Sleep apnea, unspecified: G47.30

## 2014-09-03 LAB — RAPID STREP SCREEN (MED CTR MEBANE ONLY): STREPTOCOCCUS, GROUP A SCREEN (DIRECT): NEGATIVE

## 2014-09-03 MED ORDER — BENZONATATE 100 MG PO CAPS: 100.0000 mg | ORAL_CAPSULE | Freq: Three times a day (TID) | ORAL | Status: DC | PRN

## 2014-09-03 MED ORDER — NAPROXEN 250 MG PO TABS: 250.0000 mg | ORAL_TABLET | Freq: Two times a day (BID) | ORAL | Status: DC | PRN

## 2014-09-03 NOTE — Discharge Instructions (Signed)
°Emergency Department Resource Guide °1) Find a Doctor and Pay Out of Pocket °Although you won't have to find out who is covered by your insurance plan, it is a good idea to ask around and get recommendations. You will then need to call the office and see if the doctor you have chosen will accept you as a new patient and what types of options they offer for patients who are self-pay. Some doctors offer discounts or will set up payment plans for their patients who do not have insurance, but you will need to ask so you aren't surprised when you get to your appointment. ° °2) Contact Your Local Health Department °Not all health departments have doctors that can see patients for sick visits, but many do, so it is worth a call to see if yours does. If you don't know where your local health department is, you can check in your phone book. The CDC also has a tool to help you locate your state's health department, and many state websites also have listings of all of their local health departments. ° °3) Find a Walk-in Clinic °If your illness is not likely to be very severe or complicated, you may want to try a walk in clinic. These are popping up all over the country in pharmacies, drugstores, and shopping centers. They're usually staffed by nurse practitioners or physician assistants that have been trained to treat common illnesses and complaints. They're usually fairly quick and inexpensive. However, if you have serious medical issues or chronic medical problems, these are probably not your best option. ° °No Primary Care Doctor: °- Call [facility] at  832-8000 - they can help you locate a primary care doctor that  accepts your insurance, provides certain services, etc. °- Physician Referral Service- 1-800-533-3463 ° °Chronic Pain Problems: °Organization         Address  Phone   Notes  °[facility]  (336) 297-2271 Patients need to be referred by their primary care doctor.  ° °Medication  Assistance: °Organization         Address  Phone   Notes  °[place] [facility] [address] °[place], [place] [address] (336) 641-8030 --Must be a resident of [place] °-- Must have NO insurance coverage whatsoever (no Medicaid/ Medicare, etc.) °-- The pt. MUST have a primary care doctor that directs their care regularly and follows them in the community °  °[facility]  (866) 331-1348   °[employer]  (888) 892-1162   ° °Agencies that provide inexpensive medical care: °Organization         Address  Phone   Notes  °[facility]  (336) 832-8035   °[facility]    (336) 832-7272   °[facility] [address] °[place], [place] [address] (336) 832-4777   °[facility] [address], °[place] (336) 271-4999   °[facility]    (336) 373-0678   °[facility]    (336) 272-1050   °[facility] ° [address], [place] Phone:  (336) 832-4444, Fax:  (336) 832-4440 Hours of Operation:  9 am - 6 pm, M-F.  Also accepts Medicaid/Medicare and self-pay.  °[facility] ° [address], [place] Phone: (336) 832-3150, Fax: (336) [phone]. Hours of Operation:  8:30 am - 5:30 pm, M-F.  Also accepts Medicaid and self-pay.  °[facility] [place] [address], [place] Phone: (336) 878-6027   °[facility] [address], [place], [place] (336)723-1848, Ext. 123 Mondays & Thursdays: 7-9 AM.  First 15 patients are seen on a first come, first serve basis. °  ° °Medicaid-accepting [place] Providers: ° °Organization         Address  Phone   Notes  °[facility] [address], [place] (336) 641-2100 Also accepts self-pay patients.  °[facility] [address], [place] ° (336) 856-9996   °[facility] [address], [place]  (336) 288-8857   °[facility] [address] [place] [address], [place] (336) 299-7000   °Veita Bland [address], [place]  ° (336) 373-1557 Only accepts [place] Access Medicaid patients after they have their name applied to their card.  ° °Self-Pay (no insurance) in [place]: ° °Organization         Address  Phone   Notes  °Sickle Cell Patients, [facility] [address], [place] (336) 832-1970   °[facility] [address], [place] (336) 832-4400   °[facility] ° [address], [place] (336) 992-4800   °[facility]/Dr. Osei-Bonsu ° [address] [place] [address], [place] or [address], [place] (336) 841-8500 Phone number for both [place] and [place] locations is the same.  °[facility] [address], [place] (336) 299-0000   °[facility] [address] [place] [address], [place] or [address] (336) 852-7530 °(336) 878-2260   °[facility] [address], [place] (336) 350-1642, phone; (336) 294-5005, fax Sees patients 1st and 3rd Saturday of every month.  Must not qualify for public or private insurance (i.e. Medicaid, Medicare, [place] Health Choice, Veterans' Benefits) • Household income should be no more than 200% of the poverty level •The clinic cannot treat you if you are pregnant or think you are pregnant • Sexually transmitted diseases are not treated at the clinic.  ° ° °Dental Care: °Organization         Address  Phone  Notes  °[place] [facility] [address], [place] (336) 641-6152 Accepts children up to age 21 who are enrolled in Medicaid or [place] Health Choice; pregnant women with a Medicaid card; and children who have applied for Medicaid or [place] Health Choice, but were declined, whose parents can pay a reduced fee at time of service.  °[place]  [facility] [place]  [address], [place] (336) 641-7733 Accepts children up to age 21 who are enrolled in Medicaid or [place] Health Choice; pregnant women with a Medicaid card; and children who have applied for Medicaid or [place] Health Choice, but were declined, whose parents can pay a reduced fee at time of service.  °[facility] ° [address], [place] (336) 641-4533 Patients are seen by appointment only. Walk-ins are not accepted. [facility] will see patients 18 years of age and older. °Monday - Tuesday (8am-5pm) °Most Wednesdays (8:30-5pm) °$30 per visit, cash only  °[facility] ° [address], [place] (336) 641-4533 Patients are seen by appointment only. Walk-ins are not accepted. [facility] will see patients 18 years of age and older. °One   Wednesday Evening (Monthly: Volunteer Based).  $30 per visit, cash only  °[facility]  (919) 537-3737 for adults; Children under age 4, call Graduate Pediatric Dentistry at (919) [phone]. Children aged 4-14, please call (919) [phone] to request a pediatric application. ° Dental services are provided in all areas of dental care including fillings, crowns and bridges, complete and partial dentures, implants, gum treatment, root canals, and extractions. Preventive care is also provided. Treatment is provided to both adults and children. °Patients are selected via a lottery and there is often a waiting list. °  °[facility] [address], °[place] ° (336) 763-8833 [email or web address] °  °[facility] [address], [place], [place] (336)723-1848, Ext. 123 Second and Fourth Thursday of each month, opens at 6:30 AM; Clinic ends at 9 AM.  Patients are seen on a first-come first-served basis, and a limited number are seen during each clinic.  ° °[facility] ° [address], [place], [place] (336) 723-7904    Eligibility Requirements °You must have lived in [place], [place], or [place] counties for at least the last three months. °  You cannot be eligible for state or federal sponsored healthcare insurance, including Veterans Administration, Medicaid, or Medicare. °  You generally cannot be eligible for healthcare insurance through your employer.  °  How to apply: °Eligibility screenings are held every Tuesday and Wednesday afternoon from 1:00 pm until 4:00 pm. You do not need an appointment for the interview!  °[facility] [address], [place], [place] 336-631-2330   °[facility]  336-342-8273   °[facility]  336-703-3100   °[facility]  336-570-6415   ° °Behavioral Health Resources in the Community: °Intensive Outpatient Programs °Organization         Address  Phone  Notes  °[facility] [address], [place], [place] 336-878-6098   °[facility] [address], [place], [place] 336-832-9800   °[facility] [address], [place], [place] ° 336-882-2125   °[facility] [address],  °[place], [place] 1-800-853-5163 or 336-641-4981   °Substance Abuse Resources °Organization         Address  Phone  Notes  °[facility]  336-882-2125   °[facility]  336-784-9470   °[facility]  336-285-9073   °[facility]  336-845-3988   °Residential & Outpatient Substance Abuse Program  1-800-659-3381   °Psychological Services °Organization         Address  Phone  Notes  °[facility]  336- 832-9600   °[facility]  336- 378-7881   °[facility] [address], [place] 1-800-853-5163 or 336-641-4981   ° °Mobile Crisis Teams °Organization         Address  Phone  Notes  °[facility], Mobile Crisis Care Unit  1-877-626-1772   °Assertive °[facility] ° [address]  [place], [place] 336-834-9664   °Sharon DeEsch [address] °[place] 336-554-5454   ° °Self-Help/Support Groups °Organization         Address  Phone             Notes  °[facility]  - variety of support groups  336- 373-1402 Call for more information  °Narcotics Anonymous (NA), [facility] [address], °[place]  2 meetings at this location  ° °  Residential Treatment Programs Organization         Address  Phone  Notes  [facility] [address],    [place]  [phone]-(424)[phone]   [facility]  [address], [place] [phone], [place], [place] [phone]   [facility] [address], [place] [phone] Admissions: 8am-3pm M-F  [facility] [address]    [place], [place] [phone]   [facility] [address], [place], [place] [phone]   [facility] [address]  [place], [place] [phone]) [phone]   [facility] Intensive Outpatient [address], [place], [place] [phone]   [facility] [address]  [place], [place] [phone] or [phone]   [facility] ([facility]) [address] [place], [place] [phone] Accepts Medicaid  [facility] [address]  [place] [phone] Substance Abuse/Addiction Treatment   [facility]         Address  Phone  Notes  [facility]  [phone](888) (205) [phone]   Angie FavaJulie Brannon, PhD [address], [address] [place], [place]   [phone](336) [phone] or [phone](336) [phone]   [facility]   [address] [place], [place] ([phone](336) [phone]   [facility] [address], [place], [place] [phone](336) [phone] Insurance/Medicaid/sponsorship through [facility] [address]                                    [place], [place] [phone]) [phone] Therapy/tele-psych/case    [facility] [address]   [place], [place] [phone]) [phone]    Dr. Lolly MustacheArfeen  [phone](336) [phone]   [facility]  [employer] [facility] 1) [address], [place] 2) [address], [place] 3)  [address], [place] [phone]) [phone]) [phone]  [phone](336) ([phone]   [facility] Child [facility] Hot[facility] (501)386-8033(336) (870)836-2863 or [phone](336) [phone] (After Hours)      Take the prescriptions as directed. Take over the counter decongestant (such as sudafed), as directed on packaging, for the next week.  Use over the counter normal saline nasal spray, as instructed in the Emergency Department, several times per day for the next 2 weeks. Take over the counter tylenol, as directed on packaging, as needed for discomfort.  Gargle with warm water several times per day to help with discomfort.  May also use over the counter sore throat pain medicines such as chloraseptic or sucrets, as directed on packaging, as needed for discomfort.  Call your regular medical doctor today to schedule a follow up appointment this week.

## 2014-09-03 NOTE — ED Provider Notes (Signed)
CSN: [id number]     Arrival date & time 09/03/14  [id number] History   First MD Initiated Contact with Patient 09/03/14 [phone]     Chief Complaint  Patient presents with  . Cough     HPI Pt was seen at 0710.  Per pt, c/o gradual onset and persistence of constant sore throat, runny/stuffy nose, sinus congestion, and cough for the past 5 days. Has been associated with generalized body aches/fatigue. Denies fevers, no rash, no CP/SOB, no N/V/D, no abd pain.    Past Medical History  Diagnosis Date  . Pregnant   . Gestational diabetes   . Asthma     as a child  . Sleep apnea    Past Surgical History  Procedure Laterality Date  . Elbow surgery Left   . Laparoscopic bilateral salpingectomy Bilateral 01/01/2013    Procedure: LAPAROSCOPIC BILATERAL SALPINGECTOMY;  Surgeon: Tilda BurrowJohn V Ferguson, MD;  Location: [facility];  Service: Gynecology;  Laterality: Bilateral;   Family History  Problem Relation Age of Onset  . Hypertension Mother   . Mental illness Mother     no specific diagnosis referred to as a chemical imbalance  . Hypertension Father   . Thyroid disease Father   . Heart disease Father   . Heart disease Maternal Grandmother   . Diabetes Maternal Grandmother   . Hypertension Maternal Grandmother   . Dementia Maternal Grandfather    History  Substance Use Topics  . Smoking status: Never Smoker   . Smokeless tobacco: Never Used  . Alcohol Use: Yes   OB History    Gravida Para Term Preterm AB TAB SAB Ectopic Multiple Living   4 3 3  0 1 0 0 1 0 3     Review of Systems ROS: Statement: All systems negative except as marked or noted in the HPI; Constitutional: Negative for fever and chills. +generalized body aches/fatigue ; ; Eyes: Negative for eye pain, redness and discharge. ; ; ENMT: Negative for ear pain, hoarseness, +ears congestion, nasal congestion, sinus pressure and sore throat. ; ; Cardiovascular: Negative for chest pain, palpitations, diaphoresis, dyspnea and peripheral edema. ;  ; Respiratory: +cough. Negative for wheezing and stridor. ; ; Gastrointestinal: Negative for nausea, vomiting, diarrhea, abdominal pain, blood in stool, hematemesis, jaundice and rectal bleeding. . ; ; Genitourinary: Negative for dysuria, flank pain and hematuria. ; ; Musculoskeletal: Negative for back pain and neck pain. Negative for swelling and trauma.; ; Skin: Negative for pruritus, rash, abrasions, blisters, bruising and skin lesion.; ; Neuro: Negative for headache, lightheadedness and neck stiffness. Negative for weakness, altered level of consciousness , altered mental status, extremity weakness, paresthesias, involuntary movement, seizure and syncope.      Allergies  Review of patient's allergies indicates no known allergies.  Home Medications   Prior to Admission medications   Medication Sig Start Date End Date Taking? Authorizing Provider  HYDROcodone-acetaminophen (NORCO/VICODIN) 5-325 MG per tablet Take 1 tablet by mouth every 6 (six) hours as needed for pain. 01/01/13   Tilda BurrowJohn Ferguson V, MD  ibuprofen (ADVIL,MOTRIN) 600 MG tablet 1 tablet. 11/26/12   Historical Provider, MD  Delice BisonKETOCONAZOLE-CLEANSER EX Apply 1 application topically daily as needed (for itching).     Historical Provider, MD  metoCLOPramide (REGLAN) 10 MG tablet Take 1 tablet (10 mg total) by mouth 3 (three) times daily. 12/26/12   Lazaro ArmsLuther H Eure, MD  Prenatal Vit-Fe Fumarate-FA (PRENATAL MULTIVITAMIN) TABS Take 1 tablet by mouth daily at 12 noon. 11/26/12   Tommie SamsJayce G Cook,  DO   BP 130/77 mmHg  Pulse 70  Temp(Src) 97.9 F (36.6 C) (Oral)  Resp 22  Ht  (1.676 m)  Wt 230 lb (104.327 kg)  BMI 37.14 kg/m2  SpO2 97%  LMP  (LMP Unknown) Physical Exam  0715: Physical examination:  Nursing notes reviewed; Vital signs and O2 SAT reviewed;  Constitutional: Well developed, Well nourished, Well hydrated, In no acute distress; Head:  Normocephalic, atraumatic; Eyes: EOMI, PERRL, No scleral icterus; ENMT: TM's clear bilat.  +edemetous nasal turbinates bilat with clear rhinorrhea. Mouth and pharynx without lesions. No tonsillar exudates. No intra-oral edema. No submandibular or sublingual edema. No drooling, no stridor. No pain with manipulation of larynx. No trismus. Mouth and pharynx normal, Mucous membranes moist; Neck: Supple, Full range of motion, No lymphadenopathy; Cardiovascular: Regular rate and rhythm, No murmur, rub, or gallop; Respiratory: Breath sounds clear & equal bilaterally, No rales, rhonchi, wheezes.  Speaking full sentences with ease, Normal respiratory effort/excursion; Chest: Nontender, Movement normal; Abdomen: Soft, Nontender, Nondistended, Normal bowel sounds; Genitourinary: No CVA tenderness; Extremities: Pulses normal, No tenderness, No edema, No calf edema or asymmetry.; Neuro: AA&Ox3, Major CN grossly intact.  Speech clear. No gross focal motor or sensory deficits in extremities.; Skin: Color normal, Warm, Dry.   ED Course  Procedures     EKG Interpretation None      MDM  MDM Reviewed: previous chart, nursing note and vitals Interpretation: labs and x-ray      Results for orders placed or performed during the hospital encounter of 09/03/14  Rapid strep screen  Result Value Ref Range   Streptococcus, Group A Screen (Direct) NEGATIVE NEGATIVE   Dg Chest 2 View 09/03/2014   CLINICAL DATA:  29 year old female with intermittently productive cough for the past 5 days  EXAM: CHEST  2 VIEW  COMPARISON:  None.  FINDINGS: The lungs are clear and negative for focal airspace consolidation, pulmonary edema or suspicious pulmonary nodule. Curvilinear/tubular opacity partially projecting over the left cardiac margin on the frontal view cannot be identified on the lateral view and is favored to represent a serendipitous concatenation of shadows. No pleural effusion or pneumothorax. Cardiac and mediastinal contours are within normal limits. No acute fracture or lytic or blastic osseous lesions. The  visualized upper abdominal bowel gas pattern is unremarkable.  IMPRESSION: No active cardiopulmonary disease.   Electronically Signed   By: Malachy Moan M.D.   On: 09/03/2014 07:53    0800:  Workup reassuring; tx symptomatically at this time. Dx and testing d/w pt.  Questions answered.  Verb understanding, agreeable to d/c home with outpt f/u.    Samuel Jester, DO 09/07/14 1146

## 2014-09-03 NOTE — ED Notes (Signed)
Patient c/o cough since Thursday; states productive "at times".  Patient states took Sudafed yesterday with no relief.

## 2014-09-06 LAB — CULTURE, GROUP A STREP

## 2014-09-15 ENCOUNTER — Emergency Department (HOSPITAL_COMMUNITY)
Admission: EM | Admit: 2014-09-15 | Discharge: 2014-09-15 | Disposition: A | Discharge status: Home / Self Care | Payer: Self-pay | Attending: Emergency Medicine | Admitting: Emergency Medicine

## 2014-09-15 ENCOUNTER — Encounter (HOSPITAL_COMMUNITY): Payer: Self-pay | Admitting: *Deleted

## 2014-09-15 DIAGNOSIS — F410 Panic disorder [episodic paroxysmal anxiety] without agoraphobia: Secondary | ICD-10-CM

## 2014-09-15 DIAGNOSIS — Z8669 Personal history of other diseases of the nervous system and sense organs: Secondary | ICD-10-CM | POA: Insufficient documentation

## 2014-09-15 DIAGNOSIS — Z8632 Personal history of gestational diabetes: Secondary | ICD-10-CM | POA: Insufficient documentation

## 2014-09-15 DIAGNOSIS — R05 Cough: Secondary | ICD-10-CM

## 2014-09-15 DIAGNOSIS — F419 Anxiety disorder, unspecified: Secondary | ICD-10-CM | POA: Insufficient documentation

## 2014-09-15 DIAGNOSIS — Z791 Long term (current) use of non-steroidal anti-inflammatories (NSAID): Secondary | ICD-10-CM | POA: Insufficient documentation

## 2014-09-15 DIAGNOSIS — Z79899 Other long term (current) drug therapy: Secondary | ICD-10-CM | POA: Insufficient documentation

## 2014-09-15 DIAGNOSIS — R059 Cough, unspecified: Secondary | ICD-10-CM

## 2014-09-15 DIAGNOSIS — J45901 Unspecified asthma with (acute) exacerbation: Secondary | ICD-10-CM | POA: Insufficient documentation

## 2014-09-15 MED ORDER — ALBUTEROL SULFATE HFA 108 (90 BASE) MCG/ACT IN AERS
2.0000 | INHALATION_SPRAY | Freq: Once | RESPIRATORY_TRACT | Status: AC
  Administered 2014-09-15: 2 via RESPIRATORY_TRACT
  Filled 2014-09-15: qty 6.7

## 2014-09-15 MED ORDER — PREDNISONE 20 MG PO TABS
40.0000 mg | ORAL_TABLET | Freq: Once | ORAL | Status: AC
  Administered 2014-09-15: 40 mg via ORAL
  Filled 2014-09-15: qty 2

## 2014-09-15 MED ORDER — GUAIFENESIN-CODEINE 100-10 MG/5ML PO SOLN: 10.0000 mL | Freq: Three times a day (TID) | ORAL | Status: DC | PRN

## 2014-09-15 MED ORDER — PREDNISONE 20 MG PO TABS: 40.0000 mg | ORAL_TABLET | Freq: Every day | ORAL | Status: DC

## 2014-09-15 NOTE — ED Provider Notes (Signed)
CSN: [id number]     Arrival date & time 09/15/14  4098 History  This chart was scribed for non-physician practitioner, Pauline Aus PA-C working with Vanetta Mulders, MD, by Jarvis Morgan, ED Scribe. This patient was seen in room APFT20/APFT20 and the patient's care was started at 7:32 PM.   Chief Complaint  Patient presents with  . Chest Pain    The history is provided by the patient. No language interpreter was used.    HPI Comments: Brooke Espinoza is a 29 y.o. female with a h/o sleep apnea who presents to the Emergency Department complaining of "squeezing and sharp" chest pain that came on suddenly while at work today. Pt states it lasted about 15-20 minutes and then went away. She states that she has had a similar episode like this in the past. Pt had associated heart palpitations and SOB when the symptoms were present. Pt denies any h/o anxiety. She denies any new stressors in her life. Pt was seen here on the 24th, 2 weeks ago, for similar chest pain symptoms. She was prescribed cough medication but states that cough has not resolved. Pt had CXR done at visit on 09/03/14 with no acute findings. Pt denies any heavy lifting. Pt denies nicotine or cocaine use. No h/o inhaler use. She denies BC use. No personal h/o of heart issues, DVT, PE or family h/o early onset heart disease. She denies any fever or sore throat    Past Medical History  Diagnosis Date  . Pregnant   . Gestational diabetes   . Asthma     as a child  . Sleep apnea    Past Surgical History  Procedure Laterality Date  . Elbow surgery Left   . Laparoscopic bilateral salpingectomy Bilateral 01/01/2013    Procedure: LAPAROSCOPIC BILATERAL SALPINGECTOMY;  Surgeon: Tilda Burrow, MD;  Location: [facility];  Service: Gynecology;  Laterality: Bilateral;   Family History  Problem Relation Age of Onset  . Hypertension Mother   . Mental illness Mother     no specific diagnosis referred to as a chemical imbalance  .  Hypertension Father   . Thyroid disease Father   . Heart disease Father   . Heart disease Maternal Grandmother   . Diabetes Maternal Grandmother   . Hypertension Maternal Grandmother   . Dementia Maternal Grandfather    History  Substance Use Topics  . Smoking status: Never Smoker   . Smokeless tobacco: Never Used  . Alcohol Use: Yes   OB History    Gravida Para Term Preterm AB TAB SAB Ectopic Multiple Living   0 1 0 0 1 0 3     Review of Systems  Constitutional: Negative for fever.  HENT: Negative for sore throat.   Respiratory: Positive for cough, chest tightness and shortness of breath.   Cardiovascular: Positive for chest pain and palpitations.  Gastrointestinal: Negative for nausea and vomiting.  Musculoskeletal: Negative for neck pain and neck stiffness.  Neurological: Negative for dizziness, numbness and headaches.  Psychiatric/Behavioral: Negative for suicidal ideas. The patient is not nervous/anxious.   All other systems reviewed and are negative.     Allergies  Review of patient's allergies indicates no known allergies.  Home Medications   Prior to Admission medications   Medication Sig Start Date End Date Taking? Authorizing Provider  benzonatate (TESSALON) 100 MG capsule Take 1 capsule (100 mg total) by mouth 3 (three) times daily as needed for cough. 09/03/14   Samuel Jester, DO  HYDROcodone-acetaminophen (NORCO/VICODIN) 5-325 MG per tablet Take 1 tablet by mouth every 6 (six) hours as needed for pain. 01/01/13   Tilda BurrowJohn Ferguson V, MD  ibuprofen (ADVIL,MOTRIN) 600 MG tablet 1 tablet. 11/26/12   Historical Provider, MD  Delice BisonKETOCONAZOLE-CLEANSER EX Apply 1 application topically daily as needed (for itching).     Historical Provider, MD  metoCLOPramide (REGLAN) 10 MG tablet Take 1 tablet (10 mg total) by mouth 3 (three) times daily. 12/26/12   Lazaro ArmsLuther H Eure, MD  naproxen (NAPROSYN) 250 MG tablet Take 1 tablet (250 mg total) by mouth 2 (two) times daily as needed  for mild pain or moderate pain (take with food). 09/03/14   Samuel JesterKathleen McManus, DO  Prenatal Vit-Fe Fumarate-FA (PRENATAL MULTIVITAMIN) TABS Take 1 tablet by mouth daily at 12 noon. 11/26/12   Tommie SamsJayce G Cook, DO   Triage Vitals: BP 118/72 mmHg  Pulse 94  Temp(Src) 98.1 F (36.7 C) (Oral)  Resp 24  Ht 5\' 6"  (1.676 m)  Wt 230 lb (104.327 kg)  BMI 37.14 kg/m2  SpO2 99%  LMP 06/16/2014 (Approximate)  Breastfeeding? No  Physical Exam  Constitutional: She is oriented to person, place, and time. She appears well-developed and well-nourished. No distress.  HENT:  Head: Normocephalic and atraumatic.  Eyes: Conjunctivae and EOM are normal. Pupils are equal, round, and reactive to light.  Neck: Normal range of motion. Neck supple. No tracheal deviation present.  Cardiovascular: Normal rate, regular rhythm and intact distal pulses.   No murmur heard. Pulmonary/Chest: Effort normal and breath sounds normal. No respiratory distress. She exhibits tenderness (mild sub sternal tenderness to palp).  Musculoskeletal: Normal range of motion.  Neurological: She is alert and oriented to person, place, and time. She exhibits normal muscle tone. Coordination normal.  Skin: Skin is warm and dry.  Psychiatric: She has a normal mood and affect. Her behavior is normal.  Nursing note and vitals reviewed.   ED Course  Procedures (including critical care time)  DIAGNOSTIC STUDIES: Oxygen Saturation is 99% on RA, normal by my interpretation.    COORDINATION OF CARE: 7:41 PM- Will order 12-lead EKG.  Pt advised of plan for treatment and pt agrees.     Labs Review Labs Reviewed - No data to display  Imaging Review No results found.   EKG Interpretation   Date/Time:  Monday September 15 2014 19:26:47 EST Ventricular Rate:  80 PR Interval:  116 QRS Duration: 90 QT Interval:  378 QTC Calculation: 435 R Axis:   11 Text Interpretation:  Normal sinus rhythm Moderate voltage criteria for  LVH, may be normal  variant Borderline ECG No previous ECGs available  Confirmed by ZACKOWSKI  MD, SCOTT ([id number]) on 09/15/2014 7:46:38 PM      MDM   Final diagnoses:  Cough  Anxiety attack   Patient is well appearing, vitals stable, no tachycardia, hypoxia or tachypnea.  PERC neg.  Sx's were sudden in onset and brief in duration.  She reports chest pain improved prior to arrival, but reports continued cough without improvement from previous treatment.  Chest pain is likely related to anxiety attack.  She had a neg CXR on 09/03/14 that I have also reviewed.  She appears stable for d/c and was given strict return precautions.     I personally performed the services described in this documentation, which was scribed in my presence. The recorded information has been reviewed and is accurate.     Severiano Gilbertammi Tammela Bales, PA-C 09/17/14 1523  Vanetta MuldersScott Zackowski, MD 09/25/14 [id number]

## 2014-09-15 NOTE — ED Notes (Signed)
Chest pain ,onset while at work, with sob.  Cough for 1 week.  Seen here 2/24 for cough, but no better

## 2014-09-15 NOTE — Discharge Instructions (Signed)
Cough, Adult  A cough is a reflex. It helps you clear your throat and airways. A cough can help heal your body. A cough can last 2 or 3 weeks (acute) or may last more than 8 weeks (chronic). Some common causes of a cough can include an infection, allergy, or a cold. HOME CARE  Only take medicine as told by your doctor.  If given, take your medicines (antibiotics) as told. Finish them even if you start to feel better.  Use a cold steam vaporizer or humidifier in your home. This can help loosen thick spit (secretions).  Sleep so you are almost sitting up (semi-upright). Use pillows to do this. This helps reduce coughing.  Rest as needed.  Stop smoking if you smoke. GET HELP RIGHT AWAY IF:  You have yellowish-white fluid (pus) in your thick spit.  Your cough gets worse.  Your medicine does not reduce coughing, and you are losing sleep.  You cough up blood.  You have trouble breathing.  Your pain gets worse and medicine does not help.  You have a fever. MAKE SURE YOU:   Understand these instructions.  Will watch your condition.  Will get help right away if you are not doing well or get worse. Document Released: 03/10/2011 Document Revised: 11/11/2013 Document Reviewed: 03/10/2011 [facility] Patient Information 2015 [place], [place]. This information is not intended to replace advice given to you by your health care provider. Make sure you discuss any questions you have with your health care provider.  Panic Attacks Panic attacks are sudden, short feelings of great fear or discomfort. You may have them for no reason when you are relaxed, when you are uneasy (anxious), or when you are sleeping.  HOME CARE  Take all your medicines as told.  Check with your doctor before starting new medicines.  Keep all doctor visits. GET HELP IF:  You are not able to take your medicines as told.  Your symptoms do not get better.  Your symptoms get worse. GET HELP RIGHT AWAY IF:  Your  attacks seem different than your normal attacks.  You have thoughts about hurting yourself or others.  You take panic attack medicine and you have a side effect. MAKE SURE YOU:  Understand these instructions.  Will watch your condition.  Will get help right away if you are not doing well or get worse. Document Released: 07/30/2010 Document Revised: 04/17/2013 Document Reviewed: 02/08/2013 [facility] Patient Information 2015 [place], [place]. This information is not intended to replace advice given to you by your health care provider. Make sure you discuss any questions you have with your health care provider.   Emergency Department Resource Guide 1) Find a Doctor and Pay Out of Pocket Although you won't have to find out who is covered by your insurance plan, it is a good idea to ask around and get recommendations. You will then need to call the office and see if the doctor you have chosen will accept you as a new patient and what types of options they offer for patients who are self-pay. Some doctors offer discounts or will set up payment plans for their patients who do not have insurance, but you will need to ask so you aren't surprised when you get to your appointment.  2) Contact Your Local Health Department Not all health departments have doctors that can see patients for sick visits, but many do, so it is worth a call to see if yours does. If you don't know where your local  health department is, you can check in your phone book. The CDC also has a tool to help you locate your state's health department, and many state websites also have listings of all of their local health departments.  3) Find a Walk-in Clinic If your illness is not likely to be very severe or complicated, you may want to try a walk in clinic. These are popping up all over the country in pharmacies, drugstores, and shopping centers. They're usually staffed by nurse practitioners or physician assistants that have been  trained to treat common illnesses and complaints. They're usually fairly quick and inexpensive. However, if you have serious medical issues or chronic medical problems, these are probably not your best option.  No Primary Care Doctor: - Call Health Connect at  [phone] - they can help you locate a primary care doctor that  accepts your insurance, provides certain services, etc. - Physician Referral Service- [phone]  Chronic Pain Problems: Organization         Address  Phone   Notes  [facility]  [phone] Patients need to be referred by their primary care doctor.   Medication Assistance: Organization         Address  Phone   Notes  [facility] Medication [facility] [address] [place], [place] [address] [phone] --Must be a resident of [facility] -- Must have NO insurance coverage whatsoever (no Medicaid/ Medicare, etc.) -- The pt. MUST have a primary care doctor that directs their care regularly and follows them in the community   [facility]  [phone]   [employer]  [phone]    Agencies that provide inexpensive medical care: Organization         Address  Phone   Notes  [facility]  [phone]   [facility]    [phone]   [facility] [address] [place], [place] [address] [phone]   [facility] [address] [place] [phone]   Planned Parenthood    [phone]   [facility]    [phone]   [facility]  [address], [place] Phone:  [phone], Fax:  [phone] Hours of Operation:  9 am - 6 pm, M-F.  Also accepts Medicaid/Medicare and self-pay.  [facility]  [address], [place] Phone: [phone], Fax: [phone]. Hours of Operation:  8:30 am - 5:30 pm, M-F.  Also accepts Medicaid and self-pay.   [facility] [place] [address], [place] Phone: [phone]   [facility] [address] Natasha Bence [place], [place] [phone], Ext. [phone] Mondays & Thursdays: 7-9 AM.  First 15 patients are seen on a first come, first serve basis.    Medicaid-accepting [facility] Providers:  Organization         Address  Phone   Notes  [facility] [address], [place] [phone] Also accepts self-pay patients.  [facility] [address] Laurell Josephs [place], [place]  [phone]   [facility] [address], [place] [phone]   [facility] [address], [place] [phone]   Renaye Rakers [address], [place]   [phone] Only accepts [place] patients after they  have their name applied to their card.   Self-Pay (no insurance) in [facility]:  Organization         Address  Phone   Notes  Sickle Cell Patients, [facility] Internal Medicine [address] [place], [place] [phone]   [facility] [address] [place], [place] [phone]   [facility] [place]  [address], [place] [phone]   [facility]/Dr. Osei-Bonsu  [address] [place] or [address], [place] [phone] Phone number for both [place] and [place] locations is the same.  [facility] [address], [place] [phone]   [facility] [address], [place] or [address] [phone]   [facility] [address], [place] [phone], phone; [phone], fax Sees patients 1st and 3rd Saturday of every month.  Must not qualify for public or private insurance (i.e. Medicaid, Medicare, [place] Health Choice, Veterans' Benefits)  Household income should be no  more than 200% of the poverty level The clinic cannot treat you if you are pregnant or think you are pregnant  Sexually transmitted diseases are not treated at the clinic.    Dental Care: Organization         Address  Phone  Notes  [facility] Department of [facility] [place] [address] [place], [place] [phone] Accepts children up to age 30 who are enrolled in [place] or [place] Health Choice; pregnant women with a Medicaid card; and children who have applied for Medicaid or [place] Health Choice, but were declined, whose parents can pay a reduced fee at time of service.  [facility] Department of [facility]  [address], [place] [phone] Accepts children up to age 83 who are enrolled in [place] or [place] Health Choice; pregnant women with a Medicaid card; and children who have applied for Medicaid or [place] Health Choice, but were declined, whose parents can pay a reduced fee at time of service.  [facility]  [address] [place], [place] [phone] Patients are seen by appointment only. Walk-ins are not accepted. [facility] will see patients 73 years of age and older. Monday - Tuesday (8am-5pm) Most Wednesdays (8:30-5pm) $30 per visit, cash only  [facility] Adult Dental Access PRO[facility]  [address], [facility] [phone] Patients are seen by appointment only. Walk-ins are not accepted. [facility] will see patients 73 years of age and older. One Wednesday Evening (Monthly: Volunteer Based).  $30 per visit, cash only  [employer]  [phone] for adults; Children under age 55, call Graduate Pediatric Dentistry at [phone]. Children aged 8-14, please call [phone] to request a pediatric application.  Dental services are provided in all areas of dental care including fillings, crowns and bridges, complete and partial dentures, implants, gum treatment, root canals,  and extractions. Preventive care is also provided. Treatment is provided to both adults and children. Patients are selected via a lottery and there is often a waiting list.   [facility] [address], [place]  [phone] [email or web address]   [facility] [address] [place], [place] [phone], Ext. [phone] Second and Fourth Thursday of each month, opens at 6:30 AM; Clinic ends at 9 AM.  Patients are seen  on a first-come first-served basis, and a limited number are seen during each clinic.   [facility]  [address], [place], [place] [phone]   Eligibility Requirements You must have lived in [place], [place], or [place] counties for at least the last three months.   You cannot be eligible for state or federal sponsored National Cityhealthcare insurance, including CIG[employer]Veterans Administration, [place]icaid, or [employer]icare.   You generally cannot be eligible for healthcare insurance through your employer.    How to apply: Eligibility screenings are held every Tuesday and Wednesday afternoon from 1:00 pm until 4:00 pm. You do not need an appointment for the interview!  [facility] [address], [place], [place] [phone]   [facility]  [phone]   [facility]  [phone]   [facility]  [phone]    Behavioral Health Resources in the Community: Intensive Outpatient Programs Organization         Address  Phone  Notes  [facility] [address], [place], [place] [phone]   [facility] [address], [place], [place] [phone]   [facility] [address], [place], [place]  [phone]   [facility] [address],  [place], [place] [phone] or [phone]   Substance Abuse Resources Organization         Address  Phone  Notes  [facility]  [phone]    [facility]  [phone]   [facility]  [phone]   [facility]  [phone]   Residential & Outpatient Substance Abuse Program  [phone]   Psychological Services Organization         Address  Phone  Notes  [facility]  [phone]- [phone]   [facility]  [phone]- [phone]   [facility] [address], [place] [phone] or [phone]    Mobile Crisis Teams Organization         Address  Phone  Notes  [facility], Mobile Crisis Care Unit  [phone]   [facility]  [address] [place], [place] [phone]   Doristine LocksSharon DeEsch [address] [place] [phone]    Self-Help/Support Groups Organization         Address  Phone             Notes  [facility] [place] - variety of support groups  336- [phone] Call for more information  Narcotics Anonymous (NA), [facility] [address], [place]  2 meetings at this location   Statisticianesidential Treatment Programs Organization         Address  Phone  Notes  [facility] [address],    [place]  [phone]   [facility]  [address], [place] [phone], [place], [place] [phone]   [facility] [address], [place] [phone] Admissions: 8am-3pm M-F  [facility] [address]    [place], [place] [phone]   [facility] [address], [place], [place] [phone]   [facility] [address]  [place], [place] [phone]   [facility] - Intensive Outpatient [address], [place], [place] [phone]   [facility] ([facility] [address]  [place], [place] [phone] or [phone]   [facility] ([facility]) [address] [place], [place]  [phone] Accepts Medicaid  [facility] [address],    [place] [phone] Substance Abuse/Addiction Treatment   [facility] Organization         Address  Phone  Notes  [facility]  [phone]   Angie Fava, PhD [address] Ervin Knack [place], [place]   [phone] or [phone]   [facility] Behavioral   [address] [place], [place] [phone]   [facility] [address], [place], [place] [phone] Insurance/Medicaid/sponsorship through [facility] and Families [address]                                    [place], [place] [phone] Therapy/tele-psych/case  [facility] [address], [place] [phone]    Dr. Lolly Mustache  [phone]   [facility]  [employer] [facility] Dept. 1) [address], [place] 2) [address], [place] 3)  [address], [place] [phone]  [phone]   [facility] Child Abuse Hotline ([phone] or 6818216011 (After Hours)

## 2014-10-06 ENCOUNTER — Ambulatory Visit (INDEPENDENT_AMBULATORY_CARE_PROVIDER_SITE_OTHER): Payer: 59 | Admitting: Obstetrics and Gynecology

## 2014-10-06 ENCOUNTER — Encounter: Payer: Self-pay | Admitting: Obstetrics and Gynecology

## 2014-10-06 VITALS — BP 120/70 | Ht 66.0 in | Wt 230.0 lb

## 2014-10-06 DIAGNOSIS — L680 Hirsutism: Secondary | ICD-10-CM

## 2014-10-06 DIAGNOSIS — N926 Irregular menstruation, unspecified: Secondary | ICD-10-CM | POA: Diagnosis not present

## 2014-10-06 NOTE — Progress Notes (Signed)
Patient ID: Brooke LarsenKathryn J Espinoza, female   DOB: Apr 10, 1986, 29 y.o.   MRN: [id number] Pt here today for follow up from ED visit. Pt states that ED doc wanted her to discuss PCOS with Dr. Emelda FearFerguson.

## 2014-10-06 NOTE — Progress Notes (Signed)
Patient ID: Brooke Espinoza, female   DOB: 06/19/1986, 29 y.o.   MRN: [id number]  This chart was SCRIBED for Brooke Bach, MD by Ronney Lion, ED Scribe. This patient was seen in room 1 and the patient's care was started at 2:29 PM.   [facility] Visit  Patient name: Brooke Espinoza MRN [id number]  Date of birth: 1985-10-11  CC & HPI:  [name] is a 29 y.o. female presenting today for a follow-up for an ED visit that occurred at [facility] 6 days ago, where she was seen for lightheadedness and headaches. Patient had been on her menses for 8 days, which is unusually heavy for her; she further describes going through 4 sanitary pads in less than 1 hour. Until this menses, patient had not had a period in 4 months, and states she normally has periods lasting for 4 days that are relatively light. Patient had blood tests done that showed normal hemoglobin levels; she also had a pelvic exam and was prescribed Flagyl for BV and Provera for her menses. She states her periods afterwards lightened up. She states that she still has vaginal discharge, but no bleeding.   Patient also wants to be discuss PCOS. She states she is growing facial hair, which she shaves 2-3 times per week, as well as chest hair that she has to regularly shave. She also complains of weight gain; in the past 2 months, she has gained 15-20 pounds. She denies any appetite changes or changes in diet. She also adds that she has been walking more. She notes milky breast discharge.  Patient has 3 children.   ROS:  A complete review of systems was obtained and all systems are negative except as noted in the HPI and PMH.    Pertinent History Reviewed:   Reviewed: Significant for laparoscopic bilateral salpingectomy and gestational diabetes Medical         Past Medical History  Diagnosis Date  . Pregnant   . Gestational diabetes   . Asthma     as a child  . Sleep apnea                               Surgical Hx:     Past Surgical History  Procedure Laterality Date  . Elbow surgery Left   . Laparoscopic bilateral salpingectomy Bilateral 01/01/2013    Procedure: LAPAROSCOPIC BILATERAL SALPINGECTOMY;  Surgeon: Tilda Burrow, MD;  Location: [facility];  Service: Gynecology;  Laterality: Bilateral;   Medications: Reviewed & Updated - see associated section                       Current outpatient prescriptions:  .  medroxyPROGESTERone (PROVERA) 10 MG tablet, , Disp: , Rfl:  .  metroNIDAZOLE (FLAGYL) 500 MG tablet, , Disp: , Rfl:    Social History: Reviewed -  reports that she has never smoked. She has never used smokeless tobacco.  Objective Findings:  Vitals: Blood pressure 120/70, height 5\' 6"  (1.676 m), weight 230 lb (104.327 kg), last menstrual period 09/22/2014, not currently breastfeeding.  Physical Examination: Patient here for discussion only.    Assessment & Plan:   A:  1. PCOS 2. Hirsutism 3. Bilateral milk production - Galactorrhea; several ducts  P:  1. Thyroid function tests. Fsh, tsh, prolactin, testosterone 2. Discussed ocp and spironolactone for treatment of PCOS symptoms. 3. rechk 1 wk  I personally performed the services described in this documentation, which was SCRIBED in my presence. The recorded information has been reviewed and considered accurate. It has been edited as necessary during review. Tilda Burrow, MD

## 2014-10-07 LAB — TSH: TSH: 1.9 u[IU]/mL (ref 0.450–4.500)

## 2014-10-07 LAB — FOLLICLE STIMULATING HORMONE: FSH: 6.5 m[IU]/mL

## 2014-10-07 LAB — TESTOSTERONE, FREE, TOTAL, SHBG
SEX HORMONE BINDING: 20.2 nmol/L — AB (ref 24.6–122.0)
Testosterone, Free: 1.8 pg/mL (ref 0.0–4.2)
Testosterone: 22 ng/dL (ref 8–48)

## 2014-10-07 LAB — PROLACTIN: Prolactin: 10.9 ng/mL (ref 4.8–23.3)

## 2014-10-12 ENCOUNTER — Emergency Department (HOSPITAL_COMMUNITY)
Admission: EM | Admit: 2014-10-12 | Discharge: 2014-10-12 | Disposition: A | Discharge status: Home / Self Care | Payer: PRIVATE HEALTH INSURANCE | Attending: Emergency Medicine | Admitting: Emergency Medicine

## 2014-10-12 ENCOUNTER — Encounter (HOSPITAL_COMMUNITY): Payer: Self-pay

## 2014-10-12 DIAGNOSIS — Z8669 Personal history of other diseases of the nervous system and sense organs: Secondary | ICD-10-CM | POA: Diagnosis not present

## 2014-10-12 DIAGNOSIS — J45909 Unspecified asthma, uncomplicated: Secondary | ICD-10-CM | POA: Diagnosis not present

## 2014-10-12 DIAGNOSIS — R079 Chest pain, unspecified: Secondary | ICD-10-CM | POA: Diagnosis not present

## 2014-10-12 DIAGNOSIS — F419 Anxiety disorder, unspecified: Secondary | ICD-10-CM | POA: Diagnosis present

## 2014-10-12 DIAGNOSIS — Z8632 Personal history of gestational diabetes: Secondary | ICD-10-CM | POA: Diagnosis not present

## 2014-10-12 MED ORDER — LORAZEPAM 1 MG PO TABS: 1.0000 mg | ORAL_TABLET | Freq: Three times a day (TID) | ORAL | Status: DC | PRN

## 2014-10-12 NOTE — ED Notes (Signed)
Pt alert & oriented x4, stable gait. Patient given discharge instructions, paperwork & prescription(s). Patient  instructed to stop at the registration desk to finish any additional paperwork. Patient verbalized understanding. Pt left department w/ no further questions. 

## 2014-10-12 NOTE — ED Notes (Signed)
Patient states she started having panic attacks a week ago. Tonight patient states she had a panic attack. Patient is tearful in triage. Patient denies SI/HI denies AVH

## 2014-10-12 NOTE — ED Provider Notes (Signed)
CSN: [id number]     Arrival date & time 10/12/14  2040 History   First MD Initiated Contact with Patient 10/12/14 2149     Chief Complaint  Patient presents with  . Panic Attack     (Consider location/radiation/quality/duration/timing/severity/associated sxs/prior Treatment) HPI   Brooke Espinoza is a 29 y.o. female who is here for evaluation of a sensation of panic attack. These feelings are associated with chest tightness, rapid breathing, and it'll ease. She denies fever, chills, vomiting, diarrhea, weakness or dizziness. No similar prior problem. She does not have any known stressful causes for this. He has been trying to get into see a PCP but been unable to do that. She was in the ED about 2 weeks ago with a similar process. She denies suicidal or homicidal ideation. There are no other no modifying factors.   Past Medical History  Diagnosis Date  . Pregnant   . Gestational diabetes   . Asthma     as a child  . Sleep apnea    Past Surgical History  Procedure Laterality Date  . Elbow surgery Left   . Laparoscopic bilateral salpingectomy Bilateral 01/01/2013    Procedure: LAPAROSCOPIC BILATERAL SALPINGECTOMY;  Surgeon: Tilda Burrow, MD;  Location: [facility];  Service: Gynecology;  Laterality: Bilateral;   Family History  Problem Relation Age of Onset  . Hypertension Mother   . Mental illness Mother     no specific diagnosis referred to as a chemical imbalance  . Hypertension Father   . Thyroid disease Father   . Heart disease Father   . Heart disease Maternal Grandmother   . Diabetes Maternal Grandmother   . Hypertension Maternal Grandmother   . Dementia Maternal Grandfather    History  Substance Use Topics  . Smoking status: Never Smoker   . Smokeless tobacco: Never Used  . Alcohol Use: Yes   OB History    Gravida Para Term Preterm AB TAB SAB Ectopic Multiple Living   0 1 0 0 1 0 3     Review of Systems  All other systems reviewed and are  negative.     Allergies  Review of patient's allergies indicates no known allergies.  Home Medications   Prior to Admission medications   Medication Sig Start Date End Date Taking? Authorizing Provider  LORazepam (ATIVAN) 1 MG tablet Take 1 tablet (1 mg total) by mouth 3 (three) times daily as needed for anxiety. 10/12/14   Mancel Bale, MD   BP 131/90 mmHg  Pulse 85  Temp(Src) 98.8 F (37.1 C) (Oral)  Resp 24  Ht  (1.676 m)  Wt 234 lb (106.142 kg)  BMI 37.79 kg/m2  SpO2 99%  LMP 09/22/2014 Physical Exam  Constitutional: She is oriented to person, place, and time. She appears well-developed and well-nourished. No distress.  HENT:  Head: Normocephalic and atraumatic.  Right Ear: External ear normal.  Left Ear: External ear normal.  Eyes: Conjunctivae and EOM are normal. Pupils are equal, round, and reactive to light.  Neck: Normal range of motion and phonation normal. Neck supple.  Cardiovascular: Normal rate, regular rhythm and normal heart sounds.   Pulmonary/Chest: Effort normal and breath sounds normal. She exhibits no bony tenderness.  Abdominal: Soft. There is no tenderness.  Musculoskeletal: Normal range of motion.  Neurological: She is alert and oriented to person, place, and time. No cranial nerve deficit or sensory deficit. She exhibits normal muscle tone. Coordination normal.  Skin: Skin is  warm, dry and intact.  Psychiatric: Her behavior is normal. Judgment and thought content normal.  Mild anxiety  Nursing note and vitals reviewed.   ED Course  Procedures (including critical care time)  Findings discussed with the patient. She agrees to a short outpatient treatment using Ativan and [facility].  Labs Review Labs Reviewed - No data to display  Imaging Review No results found.   EKG Interpretation None      MDM   Final diagnoses:  Anxiety    Pentax likely due to unspecified anxiety. No evidence for depression or  other acute psychiatric illness.  Nursing Notes Reviewed/ Care Coordinated Applicable Imaging Reviewed Interpretation of Laboratory Data incorporated into ED treatment  The patient appears reasonably screened and/or stabilized for discharge and I doubt any other medical condition or other [facility] requiring further screening, evaluation, or treatment in the ED at this time prior to discharge.  Plan: Home Medications- Ativan; Home Treatments- rest; return here if the recommended treatment, does not improve the symptoms; Recommended follow up- Mental Health asap     Mancel BaleElliott Vallory Oetken, MD 10/12/14 2206

## 2014-10-12 NOTE — Discharge Instructions (Signed)
Follow-up for counseling at the local mental Health Center for further assessment, and treatment.   Panic Attacks Panic attacks are sudden, short-livedsurges of severe anxiety, fear, or discomfort. They may occur for no reason when you are relaxed, when you are anxious, or when you are sleeping. Panic attacks may occur for a number of reasons:   Healthy people occasionally have panic attacks in extreme, life-threatening situations, such as war or natural disasters. Normal anxiety is a protective mechanism of the body that helps [place] react to danger (fight or flight response).  Panic attacks are often seen with anxiety disorders, such as panic disorder, social anxiety disorder, generalized anxiety disorder, and phobias. Anxiety disorders cause excessive or uncontrollable anxiety. They may interfere with your relationships or other life activities.  Panic attacks are sometimes seen with other mental illnesses, such as depression and posttraumatic stress disorder.  Certain medical conditions, prescription medicines, and drugs of abuse can cause panic attacks. SYMPTOMS  Panic attacks start suddenly, peak within 20 minutes, and are accompanied by four or more of the following symptoms:  Pounding heart or fast heart rate (palpitations).  Sweating.  Trembling or shaking.  Shortness of breath or feeling smothered.  Feeling choked.  Chest pain or discomfort.  Nausea or strange feeling in your stomach.  Dizziness, light-headedness, or feeling like you will faint.  Chills or hot flushes.  Numbness or tingling in your lips or hands and feet.  Feeling that things are not real or feeling that you are not yourself.  Fear of losing control or going crazy.  Fear of dying. Some of these symptoms can mimic serious medical conditions. For example, you may think you are having a heart attack. Although panic attacks can be very scary, they are not life threatening. DIAGNOSIS  Panic attacks are  diagnosed through an assessment by your health care provider. Your health care provider will ask questions about your symptoms, such as where and when they occurred. Your health care provider will also ask about your medical history and use of alcohol and drugs, including prescription medicines. Your health care provider may order blood tests or other studies to rule out a serious medical condition. Your health care provider may refer you to a mental health professional for further evaluation. TREATMENT   Most healthy people who have one or two panic attacks in an extreme, life-threatening situation will not require treatment.  The treatment for panic attacks associated with anxiety disorders or other mental illness typically involves counseling with a mental health professional, medicine, or a combination of both. Your health care provider will help determine what treatment is best for you.  Panic attacks due to physical illness usually go away with treatment of the illness. If prescription medicine is causing panic attacks, talk with your health care provider about stopping the medicine, decreasing the dose, or substituting another medicine.  Panic attacks due to alcohol or drug abuse go away with abstinence. Some adults need professional help in order to stop drinking or using drugs. HOME CARE INSTRUCTIONS   Take all medicines as directed by your health care provider.   Schedule and attend follow-up visits as directed by your health care provider. It is important to keep all your appointments. SEEK MEDICAL CARE IF:  You are not able to take your medicines as prescribed.  Your symptoms do not improve or get worse. SEEK IMMEDIATE MEDICAL CARE IF:   You experience panic attack symptoms that are different than your usual symptoms.  You have  serious thoughts about hurting yourself or others.  You are taking medicine for panic attacks and have a serious side effect. MAKE SURE  YOU:  Understand these instructions.  Will watch your condition.  Will get help right away if you are not doing well or get worse. Document Released: 06/27/2005 Document Revised: 07/02/2013 Document Reviewed: 02/08/2013 [facility] Patient Information 2015 [place], [place]. This information is not intended to replace advice given to you by your health care provider. Make sure you discuss any questions you have with your health care provider.  Emergency Department Resource Guide 1) Find a Doctor and Pay Out of Pocket Although you won't have to find out who is covered by your insurance plan, it is a good idea to ask around and get recommendations. You will then need to call the office and see if the doctor you have chosen will accept you as a new patient and what types of options they offer for patients who are self-pay. Some doctors offer discounts or will set up payment plans for their patients who do not have insurance, but you will need to ask so you aren't surprised when you get to your appointment.  2) Contact Your Local Health Department Not all health departments have doctors that can see patients for sick visits, but many do, so it is worth a call to see if yours does. If you don't know where your local health department is, you can check in your phone book. The CDC also has a tool to help you locate your state's health department, and many state websites also have listings of all of their local health departments.  3) Find a Walk-in Clinic If your illness is not likely to be very severe or complicated, you may want to try a walk in clinic. These are popping up all over the country in pharmacies, drugstores, and shopping centers. They're usually staffed by nurse practitioners or physician assistants that have been trained to treat common illnesses and complaints. They're usually fairly quick and inexpensive. However, if you have serious medical issues or chronic medical problems, these are  probably not your best option.  No Primary Care Doctor: - Call Health Connect at  [phone] - they can help you locate a primary care doctor that  accepts your insurance, provides certain services, etc. - Physician Referral Service- [phone]  Chronic Pain Problems: Organization         Address  Phone   Notes  [facility]  [phone] Patients need to be referred by their primary care doctor.   Medication Assistance: Organization         Address  Phone   Notes  [facility] [address] [place], [place] [address] [phone] --Must be a resident of [facility] -- Must have NO insurance coverage whatsoever (no Medicaid/ Medicare, etc.) -- The pt. MUST have a primary care doctor that directs their care regularly and follows them in the community   [facility]  [phone]   [employer]  [phone]    Agencies that provide inexpensive medical care: Organization         Address  Phone   Notes  [facility]  [phone]   [facility]    [phone]   [facility] [address] [place], [place] [address] [phone]   [facility] [address] [place] [place] [phone]   Planned  Parenthood    [phone]   [facility]    [phone]   [facility]  [address], [place] Phone:  [phone], Fax:  [phone] Hours of Operation:  9 am - 6 pm, M-F.  Also accepts Medicaid/Medicare and self-pay.  [facility]  [address], [place] Phone: [phone], Fax: [phone]. Hours of Operation:  8:30 am - 5:30 pm, M-F.  Also accepts Medicaid and self-pay.  [facility] [place] [address], [place] Phone: [phone]   [facility] [address] Natasha Bence [place], [place] [phone], Ext. [phone] Mondays & Thursdays:  7-9 AM.  First 15 patients are seen on a first come, first serve basis.    Medicaid-accepting [facility] Providers:  Organization         Address  Phone   Notes  [facility] [address], [place] [phone] Also accepts self-pay patients.  [facility] [address] Laurell Josephs [place], [place]  [phone]   [facility] [address], [place] [phone]   [facility] [address] [place] [phone]   Renaye Rakers [address], [place]   [phone] Only accepts [place] Access [place] patients after they have their name applied to their card.   Self-Pay (no insurance) in [facility]:  Organization         Address  Phone   Notes  Sickle Cell Patients, [facility] [address] [place], [place] [phone]   [facility] [address] [place], [place] [phone]   [facility]  [address], [place] [phone]   [facility]/Dr. Osei-Bonsu  [address], [place] or [address], [place] [phone] Phone number for both [place] and [place] locations is the same.  [facility] [address] [place] [phone]   [facility] [address] [place] or [address] [phone]   [facility] [address] [place] [phone], phone; [phone], fax Sees patients 1st and 3rd Saturday of every month.  Must not qualify for public or private insurance (i.e. Medicaid, Medicare, [place] Health Choice, Veterans' Benefits)  Household income should be no more than 200% of the poverty level The clinic cannot treat you if you are pregnant or think you are pregnant  Sexually transmitted diseases are not treated at the clinic.     Dental Care: Organization         Address  Phone  Notes  [facility] Department of [facility] [address] [place], [place] [phone] Accepts children up to age 67 who are enrolled in [place] or [place] Health Choice; pregnant women with a Medicaid card; and children who have applied for Medicaid or [place] Health Choice, but were declined, whose parents can pay a reduced fee at time of service.  [facility] of [facility]  [address], [place] [phone] Accepts children up to age 42 who are enrolled in [place] or [place] Health Choice; pregnant women with a Medicaid card; and children who have applied for Medicaid or [place] Health Choice, but were declined, whose  parents can pay a reduced fee at time of service.  [facility]  [address], [place] [phone] Patients are seen by appointment only. Walk-ins are not accepted. [facility] will see patients 28 years of age and older. Monday - Tuesday (8am-5pm) Most Wednesdays (8:30-5pm) $30 per visit, cash only  [facility]  [address], [facility] [phone] Patients are seen by appointment only. Walk-ins are not accepted. [facility] will see patients 28 years of age and older. One Wednesday Evening (Monthly: Volunteer Based).  $30 per visit, cash only  [facility]  [phone] for adults; Children under age 674, call Graduate Pediatric Dentistry at [phone]. Children aged 574-14, please call [phone] to request a pediatric application.  Dental services are provided in all areas of dental care including fillings, crowns and bridges, complete and partial dentures, implants, gum treatment, root canals, and extractions. Preventive care is also provided. Treatment is provided to both adults and children. Patients are selected via a lottery and there is often a waiting  list.   [facility] [address], [place]  [phone] [email or web address]   [facility] [address], [place], [place] [phone], Ext. [phone] Second and Fourth Thursday of each month, opens at 6:30 AM; Clinic ends at 9 AM.  Patients are seen on a first-come first-served basis, and a limited number are seen during each clinic.   [facility]  [address], [place], [place] [phone]   Eligibility Requirements You must have lived in [place], [place], or [place] counties for at least the last three months.   You cannot be eligible for state or federal sponsored National Cityhealthcare insurance, including CIGNAVeterans Administration, [place]icaid, or [employer]icare.   You generally cannot be eligible for healthcare insurance through your employer.    How to apply: Eligibility screenings are held every Tuesday and Wednesday afternoon from 1:00 pm until 4:00 pm. You do not need an appointment for the interview!  [facility] [address], [place], [place] [phone]   [facility]  [phone]   [facility]  [phone]   [facility]  [phone]    Behavioral Health Resources in the Community: Intensive Outpatient Programs Organization         Address  Phone  Notes  [facility] [address], [place], [place] [phone]   [facility] [address], [place], [place] [phone]   [facility] [address], [place], [place]  [phone]   [facility] [address],  [place], [place] [phone] or [phone]   Substance Abuse Resources Organization         Address  Phone  Notes  [facility]  [phone]   [facility]  [phone]   [facility]  [phone]   [facility]  [phone]   Residential & Outpatient Substance Abuse Program   [phone]   Psychological Services Organization         Address  Phone  Notes  [facility]  [phone]- [phone]   [facility]  [phone]- [phone]   [facility] [address], [place] [phone] or [phone]    Mobile Crisis Teams Organization         Address  Phone  Notes  [facility], Mobile Crisis  Care Unit  [phone]   [facility]  [address] [place], [place] [phone]   [facility] [address] [place] [phone]    Self-Help/Support Groups Organization         Address  Phone             Notes  [facility] [place] - variety of support groups  [phone]- [phone] Call for more information  Narcotics Anonymous (NA), [facility] [address], [employer] [place]  2 meetings at this location   Statistician         Address  Phone  Notes  [facility] [address],    [place]  [phone]   [facility]  [address] [place] [address], [place], [place] [phone]   [facility] [address] [place], [place] [phone] Admissions: 8am-3pm M-F  [facility] [address]    [place], [place] [phone]   [facility] [address] [place], [place], [place] [phone]   [facility] [address],  [place], [place] [phone]   [facility] - Intensive Outpatient [address]ure[address] Joseph[address], [place], [place] [phone]   [facility] ([facility] [address] [place].,  [place], [place] [phone] or [phone]   [facility] ([facility]) [address], [place], [place] [phone] Accepts Medicaid  [facility] [address]  [place] [phone] Substance Abuse/Addiction Treatment   [facility] Organization          Address  Phone  Notes  [facility]  [phone]   Angie Fava, PhD [address] Ervin Knack [place], [place]   [phone] or [phone]   [facility]   [address] [place], [place] [phone]   [facility] [address] [place], [place] [phone] Insurance/Medicaid/sponsorship through [facility] [address]                                    [place], [place] [phone] Therapy/tele-psych/case  [facility] [address] [place], [place] [phone]    Dr. Lolly Mustache  [phone]   [facility]  [employer] [facility] Dept. 1) [address] [place] 2) [address], [place] 3)  [address], [place] [phone]  [phone]   [facility] Child Abuse Hotline (719)239-3427 or [phone] (After Hours)

## 2014-10-12 NOTE — ED Notes (Signed)
2 week hx of intermittent episodes of palpitations, intolerance to heat, chest tightness and feeling like she can't get a good breath.  States "I feel like everything is racing and I don't know what's going on."  Denies fevers, n/v/d, weight loss. She has no PCP at this time.  No known hx of thyroid disorder, but father had thyroidectomy.

## 2014-10-14 ENCOUNTER — Inpatient Hospital Stay (HOSPITAL_COMMUNITY)
Admission: RE | Admit: 2014-10-14 | Discharge: 2014-10-18 | DRG: 885 | Disposition: A | Discharge status: Home / Self Care | Payer: 59 | Attending: Psychiatry | Admitting: Psychiatry

## 2014-10-14 ENCOUNTER — Emergency Department (HOSPITAL_COMMUNITY)
Admission: EM | Admit: 2014-10-14 | Discharge: 2014-10-14 | Disposition: A | Discharge status: Other Acute Inpatient Hospital | Payer: PRIVATE HEALTH INSURANCE | Attending: Emergency Medicine | Admitting: Emergency Medicine

## 2014-10-14 ENCOUNTER — Encounter (HOSPITAL_COMMUNITY): Payer: Self-pay | Admitting: *Deleted

## 2014-10-14 ENCOUNTER — Encounter (HOSPITAL_COMMUNITY): Payer: Self-pay

## 2014-10-14 DIAGNOSIS — Z8632 Personal history of gestational diabetes: Secondary | ICD-10-CM | POA: Diagnosis not present

## 2014-10-14 DIAGNOSIS — F329 Major depressive disorder, single episode, unspecified: Secondary | ICD-10-CM | POA: Insufficient documentation

## 2014-10-14 DIAGNOSIS — Z833 Family history of diabetes mellitus: Secondary | ICD-10-CM | POA: Diagnosis not present

## 2014-10-14 DIAGNOSIS — Z8249 Family history of ischemic heart disease and other diseases of the circulatory system: Secondary | ICD-10-CM

## 2014-10-14 DIAGNOSIS — R45851 Suicidal ideations: Secondary | ICD-10-CM | POA: Diagnosis present

## 2014-10-14 DIAGNOSIS — Z008 Encounter for other general examination: Secondary | ICD-10-CM | POA: Diagnosis present

## 2014-10-14 DIAGNOSIS — G4730 Sleep apnea, unspecified: Secondary | ICD-10-CM | POA: Diagnosis present

## 2014-10-14 DIAGNOSIS — F332 Major depressive disorder, recurrent severe without psychotic features: Secondary | ICD-10-CM | POA: Diagnosis present

## 2014-10-14 DIAGNOSIS — F410 Panic disorder [episodic paroxysmal anxiety] without agoraphobia: Secondary | ICD-10-CM | POA: Diagnosis present

## 2014-10-14 DIAGNOSIS — F419 Anxiety disorder, unspecified: Secondary | ICD-10-CM | POA: Insufficient documentation

## 2014-10-14 DIAGNOSIS — L680 Hirsutism: Secondary | ICD-10-CM | POA: Diagnosis present

## 2014-10-14 DIAGNOSIS — J45909 Unspecified asthma, uncomplicated: Secondary | ICD-10-CM | POA: Diagnosis not present

## 2014-10-14 DIAGNOSIS — Z8669 Personal history of other diseases of the nervous system and sense organs: Secondary | ICD-10-CM | POA: Diagnosis not present

## 2014-10-14 DIAGNOSIS — Z79899 Other long term (current) drug therapy: Secondary | ICD-10-CM | POA: Insufficient documentation

## 2014-10-14 DIAGNOSIS — F32A Depression, unspecified: Secondary | ICD-10-CM

## 2014-10-14 HISTORY — DX: Depression, unspecified: F32.A

## 2014-10-14 HISTORY — DX: Major depressive disorder, single episode, unspecified: F32.9

## 2014-10-14 LAB — RAPID URINE DRUG SCREEN, HOSP PERFORMED
Amphetamines: NOT DETECTED
Barbiturates: NOT DETECTED
Benzodiazepines: POSITIVE — AB
COCAINE: NOT DETECTED
OPIATES: NOT DETECTED
Tetrahydrocannabinol: NOT DETECTED

## 2014-10-14 LAB — COMPREHENSIVE METABOLIC PANEL
ALT: 26 U/L (ref 0–35)
AST: 20 U/L (ref 0–37)
Albumin: 4.2 g/dL (ref 3.5–5.2)
Alkaline Phosphatase: 61 U/L (ref 39–117)
Anion gap: 9 (ref 5–15)
BILIRUBIN TOTAL: 0.5 mg/dL (ref 0.3–1.2)
BUN: 15 mg/dL (ref 6–23)
CHLORIDE: 101 mmol/L (ref 96–112)
CO2: 27 mmol/L (ref 19–32)
Calcium: 8.9 mg/dL (ref 8.4–10.5)
Creatinine, Ser: 0.63 mg/dL (ref 0.50–1.10)
GFR calc non Af Amer: >90 mL/min (ref >90)
Glucose, Bld: 95 mg/dL (ref 70–99)
Potassium: 3.7 mmol/L (ref 3.5–5.1)
SODIUM: 137 mmol/L (ref 135–145)
Total Protein: 8 g/dL (ref 6.0–8.3)

## 2014-10-14 LAB — CBC
HEMATOCRIT: 44.6 % (ref 36.0–46.0)
Hemoglobin: 14.8 g/dL (ref 12.0–15.0)
MCH: 28.2 pg (ref 26.0–34.0)
MCHC: 33.2 g/dL (ref 30.0–36.0)
MCV: 85 fL (ref 78.0–100.0)
Platelets: 265 10*3/uL (ref 150–400)
RBC: 5.25 MIL/uL — AB (ref 3.87–5.11)
RDW: 14.4 % (ref 11.5–15.5)
WBC: 7.3 10*3/uL (ref 4.0–10.5)

## 2014-10-14 LAB — ACETAMINOPHEN LEVEL: Acetaminophen (Tylenol), Serum: <10 ug/mL — ABNORMAL LOW (ref 10–30)

## 2014-10-14 LAB — ETHANOL: Alcohol, Ethyl (B): <5 mg/dL (ref 0–9)

## 2014-10-14 LAB — SALICYLATE LEVEL: Salicylate Lvl: <4 mg/dL (ref 2.8–20.0)

## 2014-10-14 MED ORDER — LORAZEPAM 1 MG PO TABS
1.0000 mg | ORAL_TABLET | Freq: Three times a day (TID) | ORAL | Status: DC | PRN
  Administered 2014-10-14 – 2014-10-17 (×6): 1 mg via ORAL
  Filled 2014-10-14 (×6): qty 1

## 2014-10-14 MED ORDER — TRAZODONE HCL 50 MG PO TABS
50.0000 mg | ORAL_TABLET | Freq: Every evening | ORAL | Status: DC | PRN
  Administered 2014-10-14 – 2014-10-18 (×5): 50 mg via ORAL
  Filled 2014-10-14 (×2): qty 1
  Filled 2014-10-14: qty 6
  Filled 2014-10-14 (×3): qty 1
  Filled 2014-10-14 (×2): qty 6
  Filled 2014-10-14 (×4): qty 1
  Filled 2014-10-14: qty 6

## 2014-10-14 MED ORDER — ACETAMINOPHEN 325 MG PO TABS
650.0000 mg | ORAL_TABLET | Freq: Four times a day (QID) | ORAL | Status: DC | PRN
  Administered 2014-10-15: 650 mg via ORAL
  Filled 2014-10-14: qty 2

## 2014-10-14 MED ORDER — MAGNESIUM HYDROXIDE 400 MG/5ML PO SUSP: 30.0000 mL | Freq: Every day | ORAL | Status: DC | PRN

## 2014-10-14 MED ORDER — ALUM & MAG HYDROXIDE-SIMETH 200-200-20 MG/5ML PO SUSP: 30.0000 mL | ORAL | Status: DC | PRN
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
## 2014-10-14 NOTE — ED Notes (Signed)
Patient states she is here to be monitored for a few days because she is to be started on new depression medicine. [facility] and was told to come to the ED for medical clearance. Patient denies SI/HI. Patient denies any auditory or visual hallucinations.
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
## 2014-10-14 NOTE — BH Assessment (Signed)
Informed Dr. Romeo AppleHarrison, and Abby RN of pt acceptance.   [facility] under the care of Dr. Elna BreslowEappen to arrive at 9 pm, report to be called [phone].  Support paperwork to be faxed to [phone], and originals sent with pt.     Clista BernhardtNancy Lalitha Ilyas, [facility] Triage Specialist 10/14/2014 8:34 PM
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
## 2014-10-14 NOTE — ED Notes (Signed)
Pt. and belongings wanded by security 

## 2014-10-15 ENCOUNTER — Encounter (HOSPITAL_COMMUNITY): Payer: Self-pay | Admitting: Psychiatry

## 2014-10-15 ENCOUNTER — Encounter: Payer: Self-pay | Admitting: Obstetrics and Gynecology

## 2014-10-15 ENCOUNTER — Ambulatory Visit: Payer: 59 | Admitting: Obstetrics and Gynecology

## 2014-10-15 DIAGNOSIS — F410 Panic disorder [episodic paroxysmal anxiety] without agoraphobia: Secondary | ICD-10-CM

## 2014-10-15 LAB — PREGNANCY, URINE: PREG TEST UR: NEGATIVE

## 2014-10-15 MED ORDER — CITALOPRAM HYDROBROMIDE 20 MG PO TABS
20.0000 mg | ORAL_TABLET | Freq: Every day | ORAL | Status: DC
  Administered 2014-10-15 – 2014-10-18 (×4): 20 mg via ORAL
  Filled 2014-10-15 (×3): qty 1
  Filled 2014-10-15: qty 3
  Filled 2014-10-15: qty 1
  Filled 2014-10-15: qty 3
  Filled 2014-10-15 (×3): qty 1

## 2014-10-15 MED ORDER — NAPROXEN 500 MG PO TABS
500.0000 mg | ORAL_TABLET | Freq: Two times a day (BID) | ORAL | Status: DC | PRN
  Administered 2014-10-15 – 2014-10-16 (×2): 500 mg via ORAL
  Filled 2014-10-15 (×2): qty 1
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
## 2014-10-15 NOTE — Progress Notes (Signed)
Pt calm and cooperative. Pt attended groups once she woke up for the day. Pt compliant with meds. Pt denies SI/HI/AH/VH. Pt gave urine sample ordered by MD. No current concerns. RN will continue to monitor.

## 2014-10-15 NOTE — [facility] Group Notes (Signed)
[facility] LCSW Group Therapy  10/15/2014 1:29 PM  Type of Therapy:  Group Therapy  Participation Level:  Minimal  Participation Quality:  Attentive  Affect:  Depressed and Tearful  Cognitive:  Alert  Insight:  Limited  Engagement in Therapy:  Improving  Modes of Intervention:  Discussion, Education, Socialization and Support  Summary of Progress/Problems:Mental [employer] (MHA) speaker came to talk about his personal journey with substance abuse and mental illness. Group members were challenged to process ways by which to relate to the speaker. MHA speaker provided handouts and educational information pertaining to groups and services offered by the [facility]. Katie attended group and stayed the entire time. Prior to group, she had a difficult phone call with her husband. She states he accused her of showing inappropriate picture of herself to someone. She sat quietly and listened to the speaker.    Hyatt,Candace 10/15/2014, 1:29 PM

## 2014-10-15 NOTE — [facility] Suicide Risk Assessment (Signed)
[facility] INPATIENT:  Family/Significant Other Suicide Prevention Education  Suicide Prevention Education:  Education Completed; Caryl Pinaamara Carol, [phone] has been identified by the patient as the family member/significant other with whom the patient will be residing, and identified as the person(s) who will aid the patient in the event of a mental health crisis (suicidal ideations/suicide attempt).  With written consent from the patient, the family member/significant other has been provided the following suicide prevention education, prior to the and/or following the discharge of the patient.  The suicide prevention education provided includes the following:  Suicide risk factors  Suicide prevention and interventions  National Suicide Hotline telephone number  [facility] assessment telephone number  [facility] Assistance 911  [facility] and/or Residential Mobile Crisis Unit telephone number  Request made of family/significant other to:  Remove weapons (e.g., guns, rifles, knives), all items previously/currently identified as safety concern.    Remove drugs/medications (over-the-counter, prescriptions, illicit drugs), all items previously/currently identified as a safety concern.  The family member/significant other verbalizes understanding of the suicide prevention education information provided.  The family member/significant other agrees to remove the items of safety concern listed above.  Hyatt,Candace 10/15/2014, 4:38 PM

## 2014-10-15 NOTE — Progress Notes (Signed)
D: Pt denies SI/HI/AVh. Pt is pleasant and cooperative. Pt stated she was doing better, pt appeared less anxious, but pt still concerned. Pt stated her husband has an appointment in June with a psychiatric professional.   A: Pt was offered support and encouragement. Pt was given scheduled medications. Pt was encourage to attend groups. Q 15 minute checks were done for safety.   R:Pt attends groups and interacts well with peers and staff. Pt is taking medication. Pt receptive to treatment and safety maintained on unit.
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
## 2014-10-16 LAB — TSH: TSH: 1.437 u[IU]/mL (ref 0.350–4.500)

## 2014-10-16 NOTE — Progress Notes (Signed)
D: Patient is alert and oriented. Pt's mood and affect is depressed, sad, and blunted. Pt denies SI/HI and AVH. Pt states this morning "I just don't want to be around people." Pt remains in her room the majority of the day. Pt rates depression and hopelessness 7/10 and anxiety 8/10. Pt not attending unit groups. Pt complains of anxiety this afternoon with relief from PRN medication. Pt is attending some unit groups. A: Encouragement/Support provided to pt. Active listening by RN. Pt moved to 400 hall per provider, Cobos' request, pt reports she is happy with the hall change. Medication education reviewed with pt. PRN medication administered for anxiety per providers orders (See MAR). Scheduled medications administered per providers orders (See MAR). 15 minute checks continued per protocol for patient safety.  R: Patient cooperative and receptive to nursing interventions.  Pt remains safe.

## 2014-10-16 NOTE — [facility] Group Notes (Signed)
[facility] Group Notes:  (Counselor/Nursing/MHT/Case Management/Adjunct)  10/16/2014 1:15PM  Type of Therapy:  Group Therapy  Participation Level:  Active  Participation Quality:  Appropriate  Affect:  Flat  Cognitive:  Oriented  Insight:  Improving  Engagement in Group:  Limited  Engagement in Therapy:  Limited  Modes of Intervention:  Discussion, Exploration and Socialization  Summary of Progress/Problems: The topic for group was balance in life.  Pt participated in the discussion about when their life was in balance and out of balance and how this feels.  Pt discussed ways to get back in balance and short term goals they can work on to get where they want to be.   "It's important for me to keep my emotions in check; they can swing too much, and that is a problem."  Talked about emotions of anger, fear, sadness.  States she feels them all strongly in her relationships, and fear is especially bad, because she tends to shut down and isolate when she is experiencing that.  Identified her chickens as a stress reliever, and something she uses for coping.  Daryel Geraldorth, Lowry Bala B 10/16/2014 1:42 PM

## 2014-10-16 NOTE — [facility] Group Notes (Signed)
[facility] Group Notes:  (Nursing/MHT/Case Management/Adjunct)  Date:  10/16/2014  Time:  0900am  Type of Therapy:  Nurse Education  Participation Level:  Did Not Attend  Participation Quality:  Did not attend  Affect:  Did not attend  Cognitive:  Did not attend  Insight:  None  Engagement in Group:  Did not attend  Modes of Intervention:  Discussion, Education and Support  Summary of Progress/Problems: Patient was invited to group. Pt did not attend group and remained in bed resting.  Lendell CapriceGuthrie, Mahmud Keithly A 10/16/2014, 11:34 AM

## 2014-10-16 NOTE — Plan of Care (Signed)
Problem: Alteration in mood Goal: LTG-Patient reports reduction in suicidal thoughts (Patient reports reduction in suicidal thoughts and is able to verbalize a safety plan for whenever patient is feeling suicidal)  Outcome: Progressing Patient denies having any suicidal thoughts today.  Problem: Ineffective individual coping Goal: STG: Patient will remain free from self harm Outcome: Progressing Patient remains free from self harm. 15 minute checks continued per protocol for patient safety.   Problem: Diagnosis: Increased Risk For Suicide Attempt Goal: STG-Patient Will Comply With Medication Regime Outcome: Progressing Patient has adhered to medication regimen today with ease.
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
## 2014-10-16 NOTE — Progress Notes (Signed)
Pt attended karaoke group this evening.  

## 2014-10-17 NOTE — Plan of Care (Signed)
Problem: Alteration in mood; excessive anxiety as evidenced by: Goal: STG-Pt will report an absence of self-harm thoughts/actions (Patient will report an absence of self-harm thoughts or actions)  Outcome: Progressing Currently denies any self harm thoughts
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
## 2014-10-17 NOTE — Progress Notes (Signed)
  [facility] Discharge Plan :  Will you be returning to the same living situation after discharge:  Yes,  patient plans to return home At discharge, do you have transportation home?: Yes,  patient reports that her husband will provide transportation Do you have the ability to pay for your medications: Yes,  patient will be provided with medication samples and prescriptions at discharge  Release of information consent forms completed and in the chart;  Patient's signature needed at discharge.  Patient to Follow up at: Follow-up Information    [facility]   Why:  Therapy appointment on Tuesday April 12th at 2pm with Brooke SessionSarah Espinoza. Medication management appointment on Thursday May 5th at 11 am with Dr. Gilmore LarocheAkhtar. Please arrive 15 minutes early. Call office if you need to reshedule & Bring insurance cards to appt.   Contact information:   [address] [place], [place] [address] [phone]        Patient denies SI/HI: Yes,  denies    Safety Planning and Suicide Prevention discussed: Yes,  with patient and mother  Have you used any form of tobacco in the last 30 days? (Cigarettes, Smokeless Tobacco, Cigars, and/or Pipes): No  Has patient been referred to the Quitline?: N/A patient is not a smoker  Brooke Espinoza, Brooke CruiseKristin Espinoza 10/17/2014, 2:41 PM

## 2014-10-17 NOTE — [facility] Group Notes (Signed)
[facility] LCSW Group Therapy 10/17/2014 1:15 PM Type of Therapy: Group Therapy Participation Level: Active  Participation Quality: Attentive, Sharing and Supportive  Affect: Appropriate  Cognitive: Alert and Oriented  Insight: Developing/Improving and Engaged  Engagement in Therapy: Developing/Improving and Engaged  Modes of Intervention: Clarification, Confrontation, Discussion, Education, Exploration, Limit-setting, Orientation, Problem-solving, Rapport Building, Dance movement psychotherapisteality Testing, Socialization and Support  Summary of Progress/Problems: The topic for today was feelings about relapse. Pt discussed what relapse prevention is to them and identified triggers that they are on the path to relapse. Pt processed their feeling towards relapse and was able to relate to peers. Pt discussed coping skills that can be used for relapse prevention. Patient identified her relapse behavior as isolating. Patient engaged in discussion about relapse behaviors and recovery factors.  CSW and other group members provided emotional support and encouragement.   Brooke BruinKristin Elias Espinoza, MSW, [employer] Clinical Social Worker [facility] [phone]

## 2014-10-17 NOTE — Progress Notes (Signed)
Pt calm and cooperative. Pt does report some anxiety. Pt received PRN medication for anxiety which she reports is triggered by a phone call with her husband. Pt denies any SI/HI/AH/VH. Pt compliant with treatment plan. Pt did eat lunch and take meds as directed. No current concerns. RN will continue to monitor.

## 2014-10-17 NOTE — Progress Notes (Signed)
[facility] Group Notes:  (Nursing/MHT/Case Management/Adjunct)  Date:  10/17/2014  Time:  11:21 PM  Type of Therapy:  Group Therapy  Participation Level:  Did Not Attend  Participation Quality:  Did Not Attend  Affect:  Did Not Attend  Cognitive:  Did Not Attend  Insight:  None  Engagement in Group:  Did Not Attend  Modes of Intervention:  Socialization and Support  Summary of Progress/Problems: Pt. Was resting in bed.  Sondra ComeWilson, [place] 10/17/2014, 11:21 PM
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
## 2014-10-17 NOTE — Progress Notes (Signed)
Patient ID: Ardean LarsenKathryn J Espinoza, female   DOB: 1985/10/21, 29 y.o.   MRN: [id number]  D: Patient pleasant on approach tonight. Attended Karaoke group and interacted with other peers in dayroom. Playing cards when not in group. Did report in the middle of the night that she usually sleeps with a CPAP machine at home. Got an order but we do not presently have all the components needed for her to use our portable so we will try to order part. Patient has slept two previous nights here without. Reports mood much improved and says husband feels that she is better also. A: Staff will monitor on q 15 minute checks, follow treatment plan, and give meds as ordered. R: Cooperative on the unit.
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
## 2014-10-17 NOTE — Progress Notes (Signed)
Writer entered patients room and observed her lying in bed asleep but was easily aroused when her name was called. Patient reports having had a good dya but has just been pretty tired. She voiced no complaints, denies si/hi/a/v hallucinations. She reports that she would like to try and sleep on her own without the trazadone but will ask for it if trouble staying asleep. She reports that she will be discharging on tomorrow. Support and encouargement given, safety maintained on unit with 15 min checks.

## 2014-10-17 NOTE — [facility] Group Notes (Signed)
   [facility]  10/17/2014  8:45 AM   Participation Quality: Alert, Appropriate and Oriented  Mood/Affect: Depressed and Flat  Depression Rating: 4  Anxiety Rating: 2  Thoughts of Suicide: Pt denies SI/HI  Will you contract for safety? Yes  Current AVH: Pt denies  Plan for Discharge/Comments: Pt attended discharge planning group and actively participated in group. CSW provided pt with today's workbook. Patient reports that she plans to return home and would [place].   Transportation Means: Pt reports access to transportation  Supports: No supports mentioned at this time  Samuella BruinKristin Ethyle Tiedt, MSW, [employer]CSWA Clinical Social Worker [facility] [phone]

## 2014-10-17 NOTE — Progress Notes (Signed)
Patient ID: Brooke Espinoza, female   DOB: 09/04/1985, 29 y.o.   MRN: [id number] Requested to speak further with Dr. Francine Gravenobos,checked with him and he will touch base with her again before he leaves for the day but indisposed at this time. Checked with her what her concerns were, and she said she was wondering if there was something more she should be taking or doing to get her energy back. Explained the sx of depression and the medicine takes time to make a impactful change, and her low energy is more than likely a sx of her depression, and would resolve when the depression resolves.

## 2014-10-17 NOTE — Progress Notes (Signed)
Recreation Therapy Notes  Date: 04.08.2016 Time: 9:30am Location: [address] Group Room   Group Topic: Stress Management  Goal Area(s) Addresses:  Patient will actively participate in stress management techniques presented during session.   Behavioral Response: Did not attend.   Lovie Agresta L Jaslin Novitski, LRT/CTRS   Minyon Billiter L 10/17/2014 6:27 PM 
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
## 2014-10-18 DIAGNOSIS — F332 Major depressive disorder, recurrent severe without psychotic features: Principal | ICD-10-CM

## 2014-10-18 MED ORDER — TRAZODONE HCL 50 MG PO TABS: 50.0000 mg | ORAL_TABLET | Freq: Every evening | ORAL | Status: DC | PRN

## 2014-10-18 MED ORDER — CITALOPRAM HYDROBROMIDE 20 MG PO TABS: 20.0000 mg | ORAL_TABLET | Freq: Every day | ORAL | Status: DC

## 2014-10-18 NOTE — Progress Notes (Signed)
Discharge Note:  Patient discharged home with family member.  Patient denied SI and HI.  Denied A/V hallucinations.  Patient stated she received all her belongings, clothing, toiletries, misc items, prescriptions, medications, cell phone, [place] [employer], cards, insurance cards, etc.  Suicide prevention information given and discussed with patient who stated she understood and had no questions.

## 2014-10-18 NOTE — Plan of Care (Signed)
Problem: Diagnosis: Increased Risk For Suicide Attempt Goal: STG-Patient Will Attend All Groups On The Unit Outcome: Not Progressing Patinet did not attend group this evening.

## 2014-10-18 NOTE — [facility] Group Notes (Signed)
[facility] Group Notes: (Clinical Social Work)   10/18/2014      Type of Therapy:  Group Therapy   Participation Level:  Did Not Attend despite MHT prompting   Jesiel Garate Grossman-Orr, LCSW 10/18/2014, 11:47 AM     
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
## 2014-10-18 NOTE — [facility] Group Notes (Signed)
The focus of this group is to educate the patient on the purpose and policies of crisis stabilization and provide a format to answer questions about their admission.  The group details unit policies and expectations of patients while admitted.  Patient did not attend 0900 nurse education orientation group this morning.  Patient stayed in bed sleeping.    

## 2014-10-18 NOTE — Progress Notes (Signed)
  [facility] Discharge Plan :  Will you be returning to the same living situation after discharge:  Yes,  returning to her home At discharge, do you have transportation home?: Yes,  being picked up Do you have the ability to pay for your medications: Yes,  no issues noted  Release of information consent forms completed and in the chart;  Patient's signature needed at discharge.  Patient to Follow up at: Follow-up Information    [facility]   Why:  Therapy appointment on Tuesday April 12th at 2pm with Waldron SessionSarah Soloman. Medication management appointment on Thursday May 5th at 11 am with Dr. Gilmore LarocheAkhtar. Please arrive 15 minutes early. Call office if you need to reshedule & Bring insurance cards to appt.   Contact information:   [address] [place], [place] [address] [phone]        Patient denies SI/HI: Yes,  see doctor note    Safety Planning and Suicide Prevention discussed: Yes,  with pt in aftercare planning groups and with designated person  Have you used any form of tobacco in the last 30 days? (Cigarettes, Smokeless Tobacco, Cigars, and/or Pipes): No  Has patient been referred to the Quitline?: N/A patient is not a smoker  Sarina SerGrossman-Orr, Maclovio Henson Jo 10/18/2014, 11:51 AM 2

## 2014-10-18 NOTE — Discharge Summary (Signed)
Physician Discharge Summary Note  Patient:  Brooke Espinoza is an 29 y.o., female MRN:  [id number] DOB:  June 09, 1986 Patient phone:  [phone] (home)  Patient address:   [address] [address],  Total Time spent with patient: 30 minutes  Date of Admission:  10/14/2014 Date of Discharge: 10/18/2014  Reason for Admission:  Depression  Principal Problem: MDD (major depressive disorder), recurrent episode, severe Discharge Diagnoses: Patient Active Problem List   Diagnosis Date Noted  . MDD (major depressive disorder), recurrent episode, severe [F33.2] 10/14/2014  . 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 Irregular menses [N92.6] 10/06/2014  . Sterilization [Z30.2] 01/01/2013    Musculoskeletal: Strength & Muscle Tone: within normal limits Gait & Station: normal Patient leans: N/A  Psychiatric Specialty Exam:  SEE SRA Physical Exam  Vitals reviewed.   Review of Systems  All other systems reviewed and are negative.   Blood pressure 105/64, pulse 86, temperature 97.9 F (36.6 C), temperature source Oral, resp. rate 18, height  (1.676 m), weight 106.142 kg (234 lb), last menstrual period 09/22/2014, not currently breastfeeding.Body mass index is 37.79 kg/(m^2).  Past Medical History:  Past Medical History  Diagnosis Date  . Pregnant   . Gestational diabetes   . Asthma     as a child  . Sleep apnea   . Depression     Past Surgical History  Procedure Laterality Date  . Elbow surgery Left   . Laparoscopic bilateral salpingectomy Bilateral 01/01/2013    Procedure: LAPAROSCOPIC BILATERAL SALPINGECTOMY;  Surgeon: Tilda Burrow, MD;  Location: [facility];  Service: Gynecology;  Laterality: Bilateral;   Family History:  Family History  Problem Relation Age of Onset  . Hypertension Mother   . Mental illness Mother     no specific diagnosis referred to as a chemical imbalance  . Hypertension Father   . Thyroid disease Father   . Heart disease Father   . Heart  disease Maternal Grandmother   . Diabetes Maternal Grandmother   . Hypertension Maternal Grandmother   . Dementia Maternal Grandfather    Social History:  History  Alcohol Use  . Yes    Comment: seldom     History  Drug Use No      Risk to Self: Suicidal Ideation: Yes-Currently Present Suicidal Intent: Yes-Currently Present Is patient at risk for suicide?: Yes Suicidal Plan?: Yes-Currently Present Specify Current Suicidal Plan: Cut her wrist Access to Means: Yes Specify Access to Suicidal Means: Anything sharp What has been your use of drugs/alcohol within the last 12 months?: Pt denies substance abuse but states she gets Xanax from a friend. She did not test postive for any substances.  How many times?: 0 Other Self Harm Risks: None Reported Triggers for Past Attempts:  (NA) Intentional Self Injurious Behavior: None Risk to Others: Homicidal Ideation: No Thoughts of Harm to Others: No Current Homicidal Intent: No Current Homicidal Plan: No Access to Homicidal Means: No Identified Victim: None Reported History of harm to others?: No Assessment of Violence: None Noted Violent Behavior Description: None Reported Does patient have access to weapons?: No Criminal Charges Pending?: No Does patient have a court date: No Prior Inpatient Therapy: Prior Inpatient Therapy: No Prior Therapy Dates: NA Prior Therapy Facilty/Provider(s): NA Reason for Treatment: NA Prior Outpatient Therapy: Prior Outpatient Therapy: Yes Prior Therapy Dates: oNGOING  Prior Therapy Facilty/Provider(s): Family Doctor  Reason for Treatment: Medication Management   Level of Care:  OP  Hospital Course:  Vonda AntiguaKathryn Ringold, 29 year old married, employed female. She stated that she has a chronic history of anxiety, mostly described as a tendency to feel vaguely anxious and with a vague sense of fore boding . She has started having panic attacks over the last few weeks. Some of these have not had any clear  triggers, but she does state she feels worse in crowded situations. She has had several trips to ED for panic, and " feeling I was having a heart attack". Most recently went to ED three days ago. On day prior to admission had argument with husband. States husband tends to be jealous, and to question her whereabouts. In the context of the argument she locked herself in bathroom, and superficially cut her wrist with scissors that were there. She states she does not remember episode well, states she thinks she was " so upset I kind of blacked out". She had no prior suicidal ideations.  Ardean LarsenKathryn J Delagarza was admitted for MDD (major depressive disorder), recurrent episode, severe and crisis management.  She was treated discharged with the medications listed below under Medication List.  Medical problems were identified and treated as needed.  Home medications were restarted as appropriate.  Improvement was monitored by observation and Ardean LarsenKathryn J Campisi daily report of symptom reduction.  Emotional and mental status was monitored by daily self-inventory reports completed by Ardean LarsenKathryn J Steers and clinical staff.         Ardean LarsenKathryn J Dimond was evaluated by the treatment team for stability and plans for continued recovery upon discharge.  Ardean LarsenKathryn J Nell motivation was an integral factor for scheduling further treatment.  Employment, transportation, bed availability, health status, family support, and any pending legal issues were also considered during her hospital stay.  She was offered further treatment options upon discharge including but not limited to Residential, Intensive Outpatient, and Outpatient treatment.  Ardean LarsenKathryn J Barrick will follow up with the services as listed below under Follow Up Information.     Upon completion of this admission the patient was both mentally and medically stable for discharge denying suicidal/homicidal ideation, auditory/visual/tactile hallucinations, delusional thoughts and  paranoia.       Consults:  psychiatry  Significant Diagnostic Studies:  labs: per ED  Discharge Vitals:   Blood pressure 105/64, pulse 86, temperature 97.9 F (36.6 C), temperature source Oral, resp. rate 18, height 5\' 6"  (1.676 m), weight 106.142 kg (234 lb), last menstrual period 09/22/2014, not currently breastfeeding. Body mass index is 37.79 kg/(m^2). Lab Results:   Results for orders placed or performed during the hospital encounter of 10/14/14 (from the past 72 hour(s))  Pregnancy, urine     Status: None   Collection Time: 10/15/14  2:35 PM  Result Value Ref Range   Preg Test, Ur NEGATIVE NEGATIVE    Comment:        THE SENSITIVITY OF THIS METHODOLOGY IS >20 mIU/mL. [facility]   TSH     Status: None   Collection Time: 10/16/14  6:45 AM  Result Value Ref Range   TSH 1.437 0.350 - 4.500 uIU/mL    Comment: Performed at [facility]    Physical Findings: AIMS: Facial and Oral Movements Muscles of Facial Expression: None, normal Lips and Perioral Area: None, normal Jaw: None, normal Tongue: None, normal,Extremity Movements Upper (arms, wrists, hands, fingers): None, normal Lower (legs, knees, ankles, toes): None, normal, Trunk Movements Neck, shoulders, hips: None, normal, Overall Severity Severity of abnormal movements (highest  score from questions above): None, normal Incapacitation due to abnormal movements: None, normal Patient's awareness of abnormal movements (rate only patient's report): No Awareness, Dental Status Current problems with teeth and/or dentures?: No Does patient usually wear dentures?: No  CIWA:  CIWA-Ar Total: 1 COWS:  COWS Total Score: 2   See Psychiatric Specialty Exam and Suicide Risk Assessment completed by Attending Physician prior to discharge.  Discharge destination:  Home  Is patient on multiple antipsychotic therapies at discharge:  No   Has Patient had three or more failed trials  of antipsychotic monotherapy by history:  No  Recommended Plan for Multiple Antipsychotic Therapies: NA     Medication List    STOP taking these medications        LORazepam 1 MG tablet  Commonly known as:  ATIVAN      TAKE these medications      Indication   citalopram 20 MG tablet  Commonly known as:  CELEXA  Take 1 tablet (20 mg total) by mouth daily.   Indication:  Depression     traZODone 50 MG tablet  Commonly known as:  DESYREL  Take 1 tablet (50 mg total) by mouth at bedtime and may repeat dose one time if needed.   Indication:  Trouble Sleeping           Follow-up Information    Follow up with [facility].   Why:  Therapy appointment on Tuesday April 12th at 2pm with Waldron Session. Medication management appointment on Thursday May 5th at 11 am with Dr. Gilmore Laroche. Please arrive 15 minutes early. Call office if you need to reshedule & Bring insurance cards to appt.   Contact information:   [address] [place], [place] [address] [phone]        Follow-up recommendations:  Activity:  as tol, diet as tol  Comments:  1.  Take all your medications as prescribed.              2.  Report any adverse side effects to outpatient provider.                       3.  Patient instructed to not use alcohol or illegal drugs while on prescription medicines.            4.  In the event of worsening symptoms, instructed patient to call 911, the crisis hotline or go to nearest emergency room for evaluation of symptoms.  Total Discharge Time:  30 min  Signed: Velna Hatchet May Agustin AGNP-BC 10/18/2014, 1:29 PM  I have examined the patient and agree with the discharge plan and findings. I have also done suicide assessment on this patient.

## 2014-10-18 NOTE — [facility] Suicide Risk Assessment (Signed)
[facility]   Demographic Factors:  Married female  Total Time spent with patient: 45 minutes  Musculoskeletal: Strength & Muscle Tone: within normal limits Gait & Station: normal Patient leans: N/A  Psychiatric Specialty Exam: Physical Exam  Constitutional: She appears well-developed and well-nourished. No distress.  Skin: She is not diaphoretic.    Review of Systems  Constitutional: Negative.   Gastrointestinal: Negative for nausea.  Skin: Negative for rash.  Psychiatric/Behavioral: Negative for depression and substance abuse.    Blood pressure 105/64, pulse 86, temperature 97.9 F (36.6 C), temperature source Oral, resp. rate 18, height 5\' 6"  (1.676 m), weight 106.142 kg (234 lb), last menstrual period 09/22/2014, not currently breastfeeding.Body mass index is 37.79 kg/(m^2).  General Appearance: Casual  Eye Contact::  Fair  Speech:  Slow409  Volume:  Decreased  Mood:  Euthymic  Affect:  Constricted  Thought Process:  Linear and Logical  Orientation:  Full (Time, Place, and Person)  Thought Content:  Rumination  Suicidal Thoughts:  No  Homicidal Thoughts:  No  Memory:  Immediate;   Fair Recent;   Fair  Judgement:  Intact  Insight:  Present  Psychomotor Activity:  Normal  Concentration:  Fair  Recall:  [employer]  Fund of Knowledge:Fair  Language: Fair  Akathisia:  Negative  Handed:  Right  AIMS (if indicated):     Assets:  Communication Skills Desire for Improvement Intimacy Physical Health Social Support  Sleep:  Number of Hours: 6.25  Cognition: WNL  ADL's:  Intact   Have you used any form of tobacco in the last 30 days? (Cigarettes, Smokeless Tobacco, Cigars, and/or Pipes): No  Has this patient used any form of tobacco in the last 30 days? (Cigarettes, Smokeless Tobacco, Cigars, and/or Pipes) No  Mental Status Per Nursing Assessment::   On Admission:  Suicidal ideation indicated by patient  Current Mental Status by  Physician: no suicidal ideas. see MSE  Loss Factors: Loss of significant relationship  Historical Factors: Impulsivity  Risk Reduction Factors:   Sense of responsibility to family and Positive therapeutic relationship  Continued Clinical Symptoms:  Dysthymia  Cognitive Features That Contribute To Risk:  Closed-mindedness    Suicide Risk:  Minimal: No identifiable suicidal ideation.  Patients presenting with no risk factors but with morbid ruminations; may be classified as minimal risk based on the severity of the depressive symptoms  Principal Problem: MDD (major depressive disorder), recurrent episode, severe Discharge Diagnoses:  Patient Active Problem List   Diagnosis Date Noted  . MDD (major depressive disorder), recurrent episode, severe [F33.2] 10/14/2014  . 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 Irregular menses [N92.6] 10/06/2014  . Sterilization [Z30.2] 01/01/2013    Follow-up Information    [facility].   Why:  Therapy appointment on Tuesday April 12th at 2pm with Waldron SessionSarah Soloman. Medication management appointment on Thursday May 5th at 11 am with Dr. Gilmore LarocheAkhtar. Please arrive 15 minutes early. Call office if you need to reshedule & Bring insurance cards to appt.   Contact information:   [address] [place], [place] [address] [phone]        Plan Of Care/Follow-up recommendations:  Activity:  as tolerated Diet:  regular  See Discharge Summary for details and follow ups. Need to be compliant with medications and referrals.   Is patient on multiple antipsychotic therapies at discharge:  No   Has Patient had three or more failed trials of antipsychotic monotherapy by history:  No  Recommended Plan for Multiple Antipsychotic Therapies: NA    Brooke Espinoza 10/18/2014, 10:04 AM

## 2014-10-21 ENCOUNTER — Ambulatory Visit (HOSPITAL_COMMUNITY): Payer: Self-pay | Admitting: Licensed Clinical Social Worker

## 2014-10-22 NOTE — Progress Notes (Signed)
Patient Discharge Instructions:  Next Level Care Provider Has Access to the EMR, 10/22/14  [facility] via [facility]/Epic access.  Jerelene ReddenSheena E , 10/22/2014, 4:05 PM

## 2014-11-13 ENCOUNTER — Ambulatory Visit (INDEPENDENT_AMBULATORY_CARE_PROVIDER_SITE_OTHER): Payer: PRIVATE HEALTH INSURANCE | Admitting: Psychiatry

## 2014-11-13 ENCOUNTER — Encounter (HOSPITAL_COMMUNITY): Payer: Self-pay | Admitting: Psychiatry

## 2014-11-13 VITALS — BP 118/70 | HR 67 | Ht 65.5 in | Wt 222.0 lb

## 2014-11-13 DIAGNOSIS — F410 Panic disorder [episodic paroxysmal anxiety] without agoraphobia: Secondary | ICD-10-CM | POA: Diagnosis not present

## 2014-11-13 DIAGNOSIS — F411 Generalized anxiety disorder: Secondary | ICD-10-CM | POA: Diagnosis not present

## 2014-11-13 DIAGNOSIS — J45909 Unspecified asthma, uncomplicated: Secondary | ICD-10-CM | POA: Insufficient documentation

## 2014-11-13 DIAGNOSIS — F332 Major depressive disorder, recurrent severe without psychotic features: Secondary | ICD-10-CM | POA: Diagnosis not present

## 2014-11-13 DIAGNOSIS — G4730 Sleep apnea, unspecified: Secondary | ICD-10-CM

## 2014-11-13 DIAGNOSIS — F331 Major depressive disorder, recurrent, moderate: Secondary | ICD-10-CM

## 2014-11-13 MED ORDER — CITALOPRAM HYDROBROMIDE 20 MG PO TABS: 30.0000 mg | ORAL_TABLET | Freq: Every day | ORAL | Status: DC

## 2014-11-13 MED ORDER — LORAZEPAM 0.5 MG PO TABS: 0.5000 mg | ORAL_TABLET | Freq: Every day | ORAL | Status: DC | PRN

## 2014-11-13 MED ORDER — TRAZODONE HCL 50 MG PO TABS: 50.0000 mg | ORAL_TABLET | Freq: Every evening | ORAL | Status: DC | PRN
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
## 2014-12-02 ENCOUNTER — Telehealth (HOSPITAL_COMMUNITY): Payer: Self-pay | Admitting: *Deleted

## 2014-12-02 NOTE — Telephone Encounter (Signed)
Pt would like to speak with Dr. Gilmore LarocheAkhtar. Pt states none of her current medication is working. Please call pt to advise.

## 2014-12-04 MED ORDER — QUETIAPINE FUMARATE 50 MG PO TABS: 50.0000 mg | ORAL_TABLET | Freq: Two times a day (BID) | ORAL | Status: DC

## 2014-12-04 NOTE — Telephone Encounter (Signed)
Patient feeling irritable. Feels meds not working. She is not suicidal and is working.  I will add seroquel as mood stabiilzer and for sleep at night 50mg  . Reviewed side effects Lower back celexa to 20mg  instead of 30. In next 2 days take 10mg  only. Follow up in next few days or call 911 in case of emergency. Patient understands

## 2014-12-04 NOTE — Telephone Encounter (Signed)
Left message to call back at answering machine.

## 2014-12-12 ENCOUNTER — Ambulatory Visit (HOSPITAL_COMMUNITY): Payer: Self-pay | Admitting: Psychiatry

## 2014-12-31 ENCOUNTER — Telehealth (HOSPITAL_COMMUNITY): Payer: Self-pay | Admitting: *Deleted

## 2014-12-31 MED ORDER — CITALOPRAM HYDROBROMIDE 20 MG PO TABS: 30.0000 mg | ORAL_TABLET | Freq: Every day | ORAL | Status: DC

## 2014-12-31 NOTE — Telephone Encounter (Signed)
Pt called for a refill for Celexa 20mg . Per Dr. Gilmore Laroche, pt is authorized for a refill for Celexa 20mg , Qty 45. Prescription was sent to pharmacy. Pt has a f/u appt on 7/25. Pt states and shows understanding.

## 2015-01-09 ENCOUNTER — Emergency Department (HOSPITAL_COMMUNITY)
Admission: EM | Admit: 2015-01-09 | Discharge: 2015-01-09 | Disposition: A | Discharge status: Home / Self Care | Payer: BLUE CROSS/BLUE SHIELD | Attending: Emergency Medicine | Admitting: Emergency Medicine

## 2015-01-09 ENCOUNTER — Encounter (HOSPITAL_COMMUNITY): Payer: Self-pay | Admitting: *Deleted

## 2015-01-09 DIAGNOSIS — F329 Major depressive disorder, single episode, unspecified: Secondary | ICD-10-CM | POA: Insufficient documentation

## 2015-01-09 DIAGNOSIS — Z8669 Personal history of other diseases of the nervous system and sense organs: Secondary | ICD-10-CM | POA: Insufficient documentation

## 2015-01-09 DIAGNOSIS — Z8632 Personal history of gestational diabetes: Secondary | ICD-10-CM | POA: Insufficient documentation

## 2015-01-09 DIAGNOSIS — J45909 Unspecified asthma, uncomplicated: Secondary | ICD-10-CM | POA: Insufficient documentation

## 2015-01-09 DIAGNOSIS — R21 Rash and other nonspecific skin eruption: Secondary | ICD-10-CM | POA: Diagnosis present

## 2015-01-09 DIAGNOSIS — Z79899 Other long term (current) drug therapy: Secondary | ICD-10-CM | POA: Insufficient documentation

## 2015-01-09 MED ORDER — HYDROCODONE-ACETAMINOPHEN 5-325 MG PO TABS
1.0000 | ORAL_TABLET | Freq: Once | ORAL | Status: AC
  Administered 2015-01-09: 1 via ORAL
  Filled 2015-01-09: qty 1

## 2015-01-09 MED ORDER — PREDNISONE 10 MG PO TABS: 20.0000 mg | ORAL_TABLET | Freq: Two times a day (BID) | ORAL | Status: DC

## 2015-01-09 MED ORDER — HYDROCODONE-ACETAMINOPHEN 5-325 MG PO TABS: 1.0000 | ORAL_TABLET | ORAL | Status: DC | PRN

## 2015-01-09 MED ORDER — PREDNISONE 20 MG PO TABS
40.0000 mg | ORAL_TABLET | Freq: Once | ORAL | Status: AC
  Administered 2015-01-09: 40 mg via ORAL
  Filled 2015-01-09: qty 2

## 2015-01-09 NOTE — ED Provider Notes (Signed)
CSN: [id number]     Arrival date & time 01/09/15  2124 History   First MD Initiated Contact with Patient 01/09/15 2139     Chief Complaint  Patient presents with  . Rash     (Consider location/radiation/quality/duration/timing/severity/associated sxs/prior Treatment) HPI Brooke Espinoza is a 29 y.o. female who presents to the ED with a rash to both hands. She describes the pain in her hands as burning and needle like. She denies any other symptoms. 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 Gestational diabetes   . Asthma     as a child  . Sleep apnea   . Depression    Past Surgical History  Procedure Laterality Date  . Elbow surgery Left   . Laparoscopic bilateral salpingectomy Bilateral 01/01/2013    Procedure: LAPAROSCOPIC BILATERAL SALPINGECTOMY;  Surgeon: Tilda Burrow, MD;  Location: [facility];  Service: Gynecology;  Laterality: Bilateral;   Family History  Problem Relation Age of Onset  . Hypertension Mother   . Mental illness Mother     no specific diagnosis referred to as a chemical imbalance  . Depression Mother   . Hypertension Father   . Thyroid disease Father   . Heart disease Father   . Depression Father   . Anxiety disorder Father   . Heart disease Maternal Grandmother   . Diabetes Maternal Grandmother   . Hypertension Maternal Grandmother   . Dementia Maternal Grandfather    History  Substance Use Topics  . Smoking status: Never Smoker   . Smokeless tobacco: Never Used  . Alcohol Use: 0.6 oz/week    1 Glasses of wine per week     Comment: seldom   OB History    Gravida Para Term Preterm AB TAB SAB Ectopic Multiple Living   0 1 0 0 1 0 3     Review of Systems Negative except as stated in HPI   Allergies  Review of patient's allergies indicates no known allergies.  Home Medications   Prior to Admission medications   Medication Sig  Start Date End Date Taking? Authorizing Provider  citalopram (CELEXA) 20 MG tablet Take 1.5 tablets (30 mg total) by mouth daily. 12/31/14  Yes Thresa Ross, MD  LORazepam (ATIVAN) 0.5 MG tablet Take 1 tablet (0.5 mg total) by mouth daily as needed for anxiety. 11/13/14  Yes Thresa Ross, MD  QUEtiapine (SEROQUEL) 50 MG tablet Take 1 tablet (50 mg total) by mouth 2 (two) times daily. 12/04/14 12/04/15 Yes Thresa Ross, MD  traZODone (DESYREL) 50 MG tablet Take 1 tablet (50 mg total) by mouth at bedtime and may repeat dose one time if needed. 11/13/14  Yes Thresa Ross, MD  HYDROcodone-acetaminophen (NORCO/VICODIN) 5-325 MG per tablet Take 1 tablet by mouth every 4 (four) hours as needed. 01/09/15   Damien Cisar Orlene Och, NP  predniSONE (DELTASONE) 10 MG tablet Take 2 tablets (20 mg total) by mouth 2 (two) times daily with a meal. 01/09/15   Louretta Tantillo Orlene Och, NP   BP 121/93 mmHg  Pulse 70  Temp(Src) 98 F (36.7 C) (Oral)  Resp 15  Ht  (1.676 m)  Wt 220 lb (99.791 kg)  BMI 35.53 kg/m2  SpO2 100% Physical Exam  Constitutional: She is oriented to person, place, and time. She appears well-developed and well-nourished. No distress.  HENT:  Head: Normocephalic.  Eyes: Conjunctivae and EOM are normal.  Neck: Neck supple.  Cardiovascular: Normal rate.   Pulmonary/Chest: Effort normal.  Musculoskeletal: Normal range of motion.  Radial pulses 2+ bilateral, adequate circulation, good touch sensation. Skin of hands appears dry.  Neurological: She is alert and oriented to person, place, and time. No cranial nerve deficit.  Skin: Skin is warm and dry. Rash noted.  Fine rash to fingers and hands.   Psychiatric: She has a normal mood and affect. Her behavior is normal.  Nursing note and vitals reviewed.   ED Course  Procedures (including critical care time) Labs Review Labs Reviewed - No data to display  Dr. Estell HarpinZammit in to examine the patient. Most likely contact dermitis.  Will treat with or prednisone MDM   29 y.o. female with rash and itching to bilateral hands. Stable for d/c with treatment for contact dermatitis. She will follow up with Dr. Margo AyeHall (Derm) if symptoms persist.   Final diagnoses:  Rash of hands       [facility] Maridel Pixler, NP 01/12/15 1753  Bethann BerkshireJoseph Zammit, MD 01/13/15 1213

## 2015-01-09 NOTE — ED Notes (Signed)
Pt states that about 2 hrs ago, small blisters appeared on both hands and they itch and hurt. Pt also c/o tight sided back pain radiating down her buttocks into her right thigh.

## 2015-01-09 NOTE — Discharge Instructions (Signed)
Use Eucerin Cream on your hands to help moisten them. Take the medication as directed. Do not take the narcotic if driving as it will make you sleepy. Follow up with Dr. Margo AyeHall if symptoms persist. If you call his office number as [facility].   Contact Dermatitis Contact dermatitis is a reaction to certain substances that touch the skin. Contact dermatitis can be either irritant contact dermatitis or allergic contact dermatitis. Irritant contact dermatitis does not require previous exposure to the substance for a reaction to occur.Allergic contact dermatitis only occurs if you have been exposed to the substance before. Upon a repeat exposure, your body reacts to the substance.  CAUSES  Many substances can cause contact dermatitis. Irritant dermatitis is most commonly caused by repeated exposure to mildly irritating substances, such as:  Makeup.  Soaps.  Detergents.  Bleaches.  Acids.  Metal salts, such as nickel. Allergic contact dermatitis is most commonly caused by exposure to:  Poisonous plants.  Chemicals (deodorants, shampoos).  Jewelry.  Latex.  Neomycin in triple antibiotic cream.  Preservatives in products, including clothing. SYMPTOMS  The area of skin that is exposed may develop:  Dryness or flaking.  Redness.  Cracks.  Itching.  Pain or a burning sensation.  Blisters. With allergic contact dermatitis, there may also be swelling in areas such as the eyelids, mouth, or genitals.  DIAGNOSIS  Your caregiver can usually tell what the problem is by doing a physical exam. In cases where the cause is uncertain and an allergic contact dermatitis is suspected, a patch skin test may be performed to help determine the cause of your dermatitis. TREATMENT Treatment includes protecting the skin from further contact with the irritating substance by avoiding that substance if possible. Barrier creams, powders, and gloves may be helpful. Your  caregiver may also recommend:  Steroid creams or ointments applied 2 times daily. For best results, soak the rash area in cool water for 20 minutes. Then apply the medicine. Cover the area with a plastic wrap. You can store the steroid cream in the refrigerator for a "chilly" effect on your rash. That may decrease itching. Oral steroid medicines may be needed in more severe cases.  Antibiotics or antibacterial ointments if a skin infection is present.  Antihistamine lotion or an antihistamine taken by mouth to ease itching.  Lubricants to keep moisture in your skin.  Burow's solution to reduce redness and soreness or to dry a weeping rash. Mix one packet or tablet of solution in 2 cups cool water. Dip a clean washcloth in the mixture, wring it out a bit, and put it on the affected area. Leave the cloth in place for 30 minutes. Do this as often as possible throughout the day.  Taking several cornstarch or baking soda baths daily if the area is too large to cover with a washcloth. Harsh chemicals, such as alkalis or acids, can cause skin damage that is like a burn. You should flush your skin for 15 to 20 minutes with cold water after such an exposure. You should also seek immediate medical care after exposure. Bandages (dressings), antibiotics, and pain medicine may be needed for severely irritated skin.  HOME CARE INSTRUCTIONS  Avoid the substance that caused your reaction.  Keep the area of skin that is affected away from hot water, soap, sunlight, chemicals, acidic substances, or anything else that would irritate your skin.  Do not scratch the rash. Scratching may cause the rash to become infected.  You may take cool baths to help stop the itching.  Only take over-the-counter or prescription medicines as directed by your caregiver.  See your caregiver for follow-up care as directed to make sure your skin is healing properly. SEEK MEDICAL CARE IF:   Your condition is not better after 3  days of treatment.  You seem to be getting worse.  You see signs of infection such as swelling, tenderness, redness, soreness, or warmth in the affected area.  You have any problems related to your medicines. Document Released: 06/24/2000 Document Revised: 09/19/2011 Document Reviewed: 11/30/2010 [facility] Patient Information 2015 [place], [place]. This information is not intended to replace advice given to you by your health care provider. Make sure you discuss any questions you have with your health care provider.

## 2015-01-09 NOTE — ED Notes (Signed)
Patient with no complaints at this time. Respirations even and unlabored. Skin warm/dry. Discharge instructions reviewed with patient at this time. Patient given opportunity to voice concerns/ask questions. Patient discharged at this time and left Emergency Department with steady gait.   

## 2015-02-02 ENCOUNTER — Ambulatory Visit (HOSPITAL_COMMUNITY): Payer: Self-pay | Admitting: Psychiatry

## 2015-04-20 ENCOUNTER — Emergency Department (HOSPITAL_COMMUNITY)
Admission: EM | Admit: 2015-04-20 | Discharge: 2015-04-20 | Disposition: A | Discharge status: Home / Self Care | Payer: BLUE CROSS/BLUE SHIELD | Attending: Physician Assistant | Admitting: Physician Assistant

## 2015-04-20 ENCOUNTER — Encounter (HOSPITAL_COMMUNITY): Payer: Self-pay | Admitting: Emergency Medicine

## 2015-04-20 DIAGNOSIS — Z79899 Other long term (current) drug therapy: Secondary | ICD-10-CM | POA: Diagnosis not present

## 2015-04-20 DIAGNOSIS — J039 Acute tonsillitis, unspecified: Secondary | ICD-10-CM | POA: Diagnosis not present

## 2015-04-20 DIAGNOSIS — Z8632 Personal history of gestational diabetes: Secondary | ICD-10-CM | POA: Diagnosis not present

## 2015-04-20 DIAGNOSIS — Z8669 Personal history of other diseases of the nervous system and sense organs: Secondary | ICD-10-CM | POA: Insufficient documentation

## 2015-04-20 DIAGNOSIS — Z720 Tobacco use: Secondary | ICD-10-CM | POA: Insufficient documentation

## 2015-04-20 DIAGNOSIS — F329 Major depressive disorder, single episode, unspecified: Secondary | ICD-10-CM | POA: Diagnosis not present

## 2015-04-20 DIAGNOSIS — J45909 Unspecified asthma, uncomplicated: Secondary | ICD-10-CM | POA: Diagnosis not present

## 2015-04-20 DIAGNOSIS — J029 Acute pharyngitis, unspecified: Secondary | ICD-10-CM | POA: Diagnosis present

## 2015-04-20 LAB — RAPID STREP SCREEN (MED CTR MEBANE ONLY): STREPTOCOCCUS, GROUP A SCREEN (DIRECT): NEGATIVE

## 2015-04-20 MED ORDER — AMOXICILLIN 500 MG PO CAPS: 500.0000 mg | ORAL_CAPSULE | Freq: Three times a day (TID) | ORAL | Status: DC

## 2015-04-20 NOTE — ED Notes (Signed)
Patient c/o sore throat x 4 weeks. Initially seen at [facility] and given abx treatment. Patient states no money to follow up on continued s/s.

## 2015-04-20 NOTE — Discharge Instructions (Signed)

## 2015-04-20 NOTE — ED Notes (Signed)
Patient with no complaints at this time. Respirations even and unlabored. Skin warm/dry. Discharge instructions reviewed with patient at this time. Patient given opportunity to voice concerns/ask questions. Patient discharged at this time and left Emergency Department with steady gait.   

## 2015-04-20 NOTE — ED Provider Notes (Signed)
CSN: [id number]     Arrival date & time 04/20/15  0909 History   First MD Initiated Contact with Patient 04/20/15 [phone]     Chief Complaint  Patient presents with  . Sore Throat     (Consider location/radiation/quality/duration/timing/severity/associated sxs/prior Treatment) Patient is a 29 y.o. female presenting with pharyngitis. The history is provided by the patient. No language interpreter was used.  Sore Throat This is a new problem. The current episode started 1 to 4 weeks ago. The problem occurs constantly. The problem has been unchanged. Associated symptoms include a fever and a sore throat. Nothing aggravates the symptoms. She has tried nothing for the symptoms. The treatment provided no relief.  Pt reports she has sleep apnea.  Pt reports she has frequent sore throats.   Pt wants her tonsils removed.  Pt has not seen ENT.   Past Medical History  Diagnosis Date  . Gestational diabetes   . Asthma     as a child  . Sleep apnea   . Depression    Past Surgical History  Procedure Laterality Date  . Elbow surgery Left   . Laparoscopic bilateral salpingectomy Bilateral 01/01/2013    Procedure: LAPAROSCOPIC BILATERAL SALPINGECTOMY;  Surgeon: Tilda Burrow, MD;  Location: [facility];  Service: Gynecology;  Laterality: Bilateral;   Family History  Problem Relation Age of Onset  . Hypertension Mother   . Mental illness Mother     no specific diagnosis referred to as a chemical imbalance  . Depression Mother   . Hypertension Father   . Thyroid disease Father   . Heart disease Father   . Depression Father   . Anxiety disorder Father   . Heart disease Maternal Grandmother   . Diabetes Maternal Grandmother   . Hypertension Maternal Grandmother   . Dementia Maternal Grandfather    Social History  Substance Use Topics  . Smoking status: Current Some Day Smoker    Types: Cigarettes  . Smokeless tobacco: Never Used  . Alcohol Use: 0.6 oz/week    1 Glasses of wine per week   Comment: seldom   OB History    Gravida Para Term Preterm AB TAB SAB Ectopic Multiple Living   0 1 0 0 1 0 3     Review of Systems  Constitutional: Positive for fever.  HENT: Positive for sore throat.   All other systems reviewed and are negative.     Allergies  Review of patient's allergies indicates no known allergies.  Home Medications   Prior to Admission medications   Medication Sig Start Date End Date Taking? Authorizing Provider  citalopram (CELEXA) 20 MG tablet Take 1.5 tablets (30 mg total) by mouth daily. 12/31/14  Yes Thresa Ross, MD  QUEtiapine (SEROQUEL) 50 MG tablet Take 1 tablet (50 mg total) by mouth 2 (two) times daily. 12/04/14 12/04/15 Yes Thresa Ross, MD  traZODone (DESYREL) 50 MG tablet Take 1 tablet (50 mg total) by mouth at bedtime and may repeat dose one time if needed. 11/13/14  Yes Thresa Ross, MD  HYDROcodone-acetaminophen (NORCO/VICODIN) 5-325 MG per tablet Take 1 tablet by mouth every 4 (four) hours as needed. Patient not taking: Reported on 04/20/2015 01/09/15   Janne Napoleon, NP  LORazepam (ATIVAN) 0.5 MG tablet Take 1 tablet (0.5 mg total) by mouth daily as needed for anxiety. Patient not taking: Reported on 04/20/2015 11/13/14   Thresa Ross, MD  predniSONE (DELTASONE) 10 MG tablet Take 2 tablets (20 mg total) by  mouth 2 (two) times daily with a meal. Patient not taking: Reported on 04/20/2015 01/09/15   Janne Napoleon, NP   BP 119/66 mmHg  Pulse 80  Temp(Src) 98.4 F (36.9 C) (Oral)  Resp 15  Ht  (1.676 m)  Wt 225 lb (102.059 kg)  BMI 36.33 kg/m2  SpO2 98% Physical Exam  Constitutional: She appears well-developed and well-nourished.  HENT:  Head: Normocephalic.  Left Ear: External ear normal.  Enlarged tonsils,  No exudate,    Eyes: Conjunctivae and EOM are normal. Pupils are equal, round, and reactive to light.  Neck: Normal range of motion. Neck supple.  Cardiovascular: Normal rate and normal heart sounds.   Pulmonary/Chest:  Effort normal.  Abdominal: Soft.  Musculoskeletal: Normal range of motion.  Neurological: She is alert.  Skin: Skin is warm.  Nursing note and vitals reviewed.   ED Course  Procedures (including critical care time) Labs Review Labs Reviewed  RAPID STREP SCREEN (NOT AT [facility])    Imaging Review No results found. I have personally reviewed and evaluated these images and lab results as part of my medical decision-making.   EKG Interpretation None      MDM   Final diagnoses:  Tonsillitis    amoxicillian Schedule to see ENT for evaluation     Elson Areas, PA-C 04/20/15 [phone]  Courteney Randall An, MD 04/20/15 1527

## 2015-04-22 LAB — CULTURE, GROUP A STREP

## 2015-05-01 ENCOUNTER — Encounter (HOSPITAL_COMMUNITY): Payer: Self-pay | Admitting: *Deleted

## 2015-05-01 ENCOUNTER — Emergency Department (HOSPITAL_COMMUNITY)
Admission: EM | Admit: 2015-05-01 | Discharge: 2015-05-01 | Disposition: A | Discharge status: Home / Self Care | Payer: BLUE CROSS/BLUE SHIELD | Attending: Emergency Medicine | Admitting: Emergency Medicine

## 2015-05-01 DIAGNOSIS — M5441 Lumbago with sciatica, right side: Secondary | ICD-10-CM | POA: Insufficient documentation

## 2015-05-01 DIAGNOSIS — F329 Major depressive disorder, single episode, unspecified: Secondary | ICD-10-CM | POA: Insufficient documentation

## 2015-05-01 DIAGNOSIS — Z79899 Other long term (current) drug therapy: Secondary | ICD-10-CM | POA: Insufficient documentation

## 2015-05-01 DIAGNOSIS — Z87891 Personal history of nicotine dependence: Secondary | ICD-10-CM | POA: Insufficient documentation

## 2015-05-01 DIAGNOSIS — J45909 Unspecified asthma, uncomplicated: Secondary | ICD-10-CM | POA: Insufficient documentation

## 2015-05-01 DIAGNOSIS — M545 Low back pain: Secondary | ICD-10-CM | POA: Diagnosis present

## 2015-05-01 DIAGNOSIS — Z8632 Personal history of gestational diabetes: Secondary | ICD-10-CM | POA: Insufficient documentation

## 2015-05-01 DIAGNOSIS — Z8669 Personal history of other diseases of the nervous system and sense organs: Secondary | ICD-10-CM | POA: Diagnosis not present

## 2015-05-01 MED ORDER — METHOCARBAMOL 500 MG PO TABS: 500.0000 mg | ORAL_TABLET | Freq: Three times a day (TID) | ORAL | Status: DC

## 2015-05-01 MED ORDER — PREDNISONE 50 MG PO TABS
60.0000 mg | ORAL_TABLET | Freq: Once | ORAL | Status: AC
  Administered 2015-05-01: 60 mg via ORAL
  Filled 2015-05-01 (×2): qty 1

## 2015-05-01 MED ORDER — HYDROCODONE-ACETAMINOPHEN 5-325 MG PO TABS
2.0000 | ORAL_TABLET | Freq: Once | ORAL | Status: AC
  Administered 2015-05-01: 2 via ORAL
  Filled 2015-05-01: qty 2

## 2015-05-01 MED ORDER — HYDROCODONE-ACETAMINOPHEN 5-325 MG PO TABS: 1.0000 | ORAL_TABLET | ORAL | Status: DC | PRN

## 2015-05-01 MED ORDER — DIAZEPAM 5 MG PO TABS
5.0000 mg | ORAL_TABLET | Freq: Once | ORAL | Status: AC
  Administered 2015-05-01: 5 mg via ORAL
  Filled 2015-05-01: qty 1

## 2015-05-01 MED ORDER — IBUPROFEN 800 MG PO TABS
800.0000 mg | ORAL_TABLET | Freq: Once | ORAL | Status: AC
  Administered 2015-05-01: 800 mg via ORAL
  Filled 2015-05-01: qty 1

## 2015-05-01 MED ORDER — MELOXICAM 15 MG PO TABS: 15.0000 mg | ORAL_TABLET | Freq: Every day | ORAL | Status: DC

## 2015-05-01 MED ORDER — DEXAMETHASONE 4 MG PO TABS: 4.0000 mg | ORAL_TABLET | Freq: Two times a day (BID) | ORAL | Status: DC

## 2015-05-01 NOTE — Discharge Instructions (Signed)
[facility], or call the on-call orthopedist, Dr. Shon BatonBrooks for an office appointment concerning your back. Please alternate heat and ice until seen by orthopedics. Please use medications as ordered. Robaxin and Norco may cause drowsiness, please use with caution. Please rest your back is much as possible. Sciatica Sciatica is pain, weakness, numbness, or tingling along your sciatic nerve. The nerve starts in the lower back and runs down the back of each leg. Nerve damage or certain conditions pinch or put pressure on the sciatic nerve. This causes the pain, weakness, and other discomforts of sciatica. HOME CARE   Only take medicine as told by your doctor.  Apply ice to the affected area for 20 minutes. Do this 3-4 times a day for the first 48-72 hours. Then try heat in the same way.  Exercise, stretch, or do your usual activities if these do not make your pain worse.  Go to physical therapy as told by your doctor.  Keep all doctor visits as told.  Do not wear high heels or shoes that are not supportive.  Get a firm mattress if your mattress is too soft to lessen pain and discomfort. GET HELP RIGHT AWAY IF:   You cannot control when you poop (bowel movement) or pee (urinate).  You have more weakness in your lower back, lower belly (pelvis), butt (buttocks), or legs.  You have redness or puffiness (swelling) of your back.  You have a burning feeling when you pee.  You have pain that gets worse when you lie down.  You have pain that wakes you from your sleep.  Your pain is worse than past pain.  Your pain lasts longer than 4 weeks.  You are suddenly losing weight without reason. MAKE SURE YOU:   Understand these instructions.  Will watch this condition.  Will get help right away if you are not doing well or get worse.   This information is not intended to replace advice given to you by your health care provider. Make sure you  discuss any questions you have with your health care provider.   Document Released: 04/05/2008 Document Revised: 03/18/2015 Document Reviewed: 11/06/2011 Elsevier Interactive Patient Education [employer]2016 Elsevier Inc.

## 2015-05-01 NOTE — ED Provider Notes (Signed)
CSN: [id number]     Arrival date & time 05/01/15  1304 History   First MD Initiated Contact with Patient 05/01/15 1522     Chief Complaint  Patient presents with  . Back Pain     (Consider location/radiation/quality/duration/timing/severity/associated sxs/prior Treatment) Patient is a 29 y.o. female presenting with back pain. The history is provided by the patient.  Back Pain Location:  Lumbar spine Radiates to:  R thigh Pain is:  Same all the time Onset quality:  Gradual Duration:  1 day Timing:  Intermittent Progression:  Worsening Chronicity:  New Context comment:  Standing in one area for extended period of time Worsened by:  Standing Associated symptoms: no bladder incontinence, no bowel incontinence and no perianal numbness     Past Medical History  Diagnosis Date  . Asthma     as a child  . Sleep apnea   . Depression   . Gestational diabetes    Past Surgical History  Procedure Laterality Date  . Elbow surgery Left   . Laparoscopic bilateral salpingectomy Bilateral 01/01/2013    Procedure: LAPAROSCOPIC BILATERAL SALPINGECTOMY;  Surgeon: Tilda Burrow, MD;  Location: [facility];  Service: Gynecology;  Laterality: Bilateral;   Family History  Problem Relation Age of Onset  . Hypertension Mother   . Mental illness Mother     no specific diagnosis referred to as a chemical imbalance  . Depression Mother   . Hypertension Father   . Thyroid disease Father   . Heart disease Father   . Depression Father   . Anxiety disorder Father   . Heart disease Maternal Grandmother   . Diabetes Maternal Grandmother   . Hypertension Maternal Grandmother   . Dementia Maternal Grandfather    Social History  Substance Use Topics  . Smoking status: Former Smoker    Types: Cigarettes    Quit date: 04/01/2015  . Smokeless tobacco: Never Used  . Alcohol Use: 0.6 oz/week    1 Glasses of wine per week     Comment: seldom   OB History    Gravida Para Term Preterm AB TAB SAB  Ectopic Multiple Living   0 1 0 0 1 0 3     Review of Systems  Gastrointestinal: Negative for bowel incontinence.  Genitourinary: Negative for bladder incontinence.  Musculoskeletal: Positive for back pain.  All other systems reviewed and are negative.     Allergies  Review of patient's allergies indicates no known allergies.  Home Medications   Prior to Admission medications   Medication Sig Start Date End Date Taking? Authorizing Provider  citalopram (CELEXA) 20 MG tablet Take 1.5 tablets (30 mg total) by mouth daily. 12/31/14  Yes Thresa Ross, MD  QUEtiapine (SEROQUEL) 50 MG tablet Take 1 tablet (50 mg total) by mouth 2 (two) times daily. 12/04/14 12/04/15 Yes Thresa Ross, MD  traZODone (DESYREL) 50 MG tablet Take 1 tablet (50 mg total) by mouth at bedtime and may repeat dose one time if needed. 11/13/14  Yes Thresa Ross, MD  amoxicillin (AMOXIL) 500 MG capsule Take 1 capsule (500 mg total) by mouth 3 (three) times daily. Patient not taking: Reported on 05/01/2015 04/20/15   Elson Areas, PA-C  HYDROcodone-acetaminophen (NORCO/VICODIN) 5-325 MG per tablet Take 1 tablet by mouth every 4 (four) hours as needed. Patient not taking: Reported on 04/20/2015 01/09/15   Janne Napoleon, NP  LORazepam (ATIVAN) 0.5 MG tablet Take 1 tablet (0.5 mg total) by mouth daily as needed  for anxiety. Patient not taking: Reported on 04/20/2015 11/13/14   Thresa Ross, MD  predniSONE (DELTASONE) 10 MG tablet Take 2 tablets (20 mg total) by mouth 2 (two) times daily with a meal. Patient not taking: Reported on 04/20/2015 01/09/15   Janne Napoleon, NP   BP 108/68 mmHg  Temp(Src) 98.1 F (36.7 C) (Oral)  Resp 16  Ht  (1.676 m)  Wt 230 lb (104.327 kg)  BMI 37.14 kg/m2  SpO2 100%  LMP 05/01/2015 Physical Exam  Constitutional: She is oriented to person, place, and time. She appears well-developed and well-nourished.  Non-toxic appearance.  HENT:  Head: Normocephalic.  Right Ear: Tympanic  membrane and external ear normal.  Left Ear: Tympanic membrane and external ear normal.  Eyes: EOM and lids are normal. Pupils are equal, round, and reactive to light.  Neck: Normal range of motion. Neck supple. Carotid bruit is not present.  Cardiovascular: Normal rate, regular rhythm, normal heart sounds, intact distal pulses and normal pulses.   Pulmonary/Chest: Breath sounds normal. No respiratory distress.  Abdominal: Soft. Bowel sounds are normal. There is no tenderness. There is no guarding.  Musculoskeletal: Normal range of motion.  There is lower lumbar midline tenderness to palpation, and with attempted range of motion. Straight leg raise positive for pain at 20. There is lower right lumbar paraspinal area tenderness present. No palpable step off of the cervical, thoracic, or lumbar spine area. No temp changes of the right lower extremities. DP pulse 2+  Lymphadenopathy:       Head (right side): No submandibular adenopathy present.       Head (left side): No submandibular adenopathy present.    She has no cervical adenopathy.  Neurological: She is alert and oriented to person, place, and time. She has normal strength. No cranial nerve deficit or sensory deficit.  No gross neurologic deficit noted on limited examination . Patient is uncooperative for some of the examination due to pain. She was only able to take a few steps before having to stop because of pain on the right side.  The patient states that she can feel touch and pain on the right, but states that the leg feels cold, and the stimuli feel cold as well.  Skin: Skin is warm and dry.  Psychiatric: She has a normal mood and affect. Her speech is normal.  Nursing note and vitals reviewed.   ED Course  Procedures (including critical care time) Labs Review Labs Reviewed - No data to display  Imaging Review No results found. I have personally reviewed and evaluated these images and lab results as part of my medical  decision-making.   EKG Interpretation None      MDM  Vital signs are within normal limits. Pulse oximetry is 100% on room air. Within normal limits by my interpretation. The examination favors sciatica. The patient states that her husband is seen by [facility] orthopedics for back pain, and she would like to see [facility]opedic as well. The patient is also given the pain and referral to Dr. Shon Baton who was the on-call orthopedic physician. The patient has been advised to alternate heat and ice. Prescription for Norco, Robaxin, Decadron, and Mobitz given to the patient. The patient is to return immediately if any changes, problems, or concerns.    Final diagnoses:  None    *I have reviewed nursing notes, vital signs, and all appropriate lab and imaging results for this patient.Ivery Quale, PA-C 05/01/15 [phone]  Zadie Rhineonald Wickline, MD 05/04/15 [phone]

## 2015-05-01 NOTE — ED Notes (Signed)
PA at bedside.

## 2015-05-01 NOTE — ED Notes (Signed)
Pt states right lower back pain with pain radiating down right leg. Pain began last night while standing in one spot at work last night. NAD>

## 2015-05-21 ENCOUNTER — Ambulatory Visit (INDEPENDENT_AMBULATORY_CARE_PROVIDER_SITE_OTHER): Payer: Self-pay | Admitting: Otolaryngology

## 2015-12-05 ENCOUNTER — Encounter (HOSPITAL_COMMUNITY): Payer: Self-pay | Admitting: Emergency Medicine

## 2015-12-05 ENCOUNTER — Emergency Department (HOSPITAL_COMMUNITY): Payer: BLUE CROSS/BLUE SHIELD

## 2015-12-05 ENCOUNTER — Emergency Department (HOSPITAL_COMMUNITY)
Admission: EM | Admit: 2015-12-05 | Discharge: 2015-12-05 | Disposition: A | Discharge status: Home / Self Care | Payer: BLUE CROSS/BLUE SHIELD | Attending: Emergency Medicine | Admitting: Emergency Medicine

## 2015-12-05 DIAGNOSIS — F329 Major depressive disorder, single episode, unspecified: Secondary | ICD-10-CM | POA: Insufficient documentation

## 2015-12-05 DIAGNOSIS — N390 Urinary tract infection, site not specified: Secondary | ICD-10-CM | POA: Diagnosis not present

## 2015-12-05 DIAGNOSIS — J45909 Unspecified asthma, uncomplicated: Secondary | ICD-10-CM | POA: Diagnosis not present

## 2015-12-05 DIAGNOSIS — Z87891 Personal history of nicotine dependence: Secondary | ICD-10-CM | POA: Insufficient documentation

## 2015-12-05 DIAGNOSIS — Z79899 Other long term (current) drug therapy: Secondary | ICD-10-CM | POA: Diagnosis not present

## 2015-12-05 DIAGNOSIS — R109 Unspecified abdominal pain: Secondary | ICD-10-CM | POA: Diagnosis present

## 2015-12-05 DIAGNOSIS — R110 Nausea: Secondary | ICD-10-CM | POA: Diagnosis not present

## 2015-12-05 LAB — URINALYSIS, ROUTINE W REFLEX MICROSCOPIC
BILIRUBIN URINE: NEGATIVE
Glucose, UA: NEGATIVE mg/dL
Ketones, ur: NEGATIVE mg/dL
NITRITE: POSITIVE — AB
PROTEIN: 30 mg/dL — AB
SPECIFIC GRAVITY, URINE: 1.01 (ref 1.005–1.030)
pH: 6 (ref 5.0–8.0)

## 2015-12-05 LAB — URINE MICROSCOPIC-ADD ON

## 2015-12-05 LAB — PREGNANCY, URINE: PREG TEST UR: NEGATIVE

## 2015-12-05 MED ORDER — MORPHINE SULFATE (PF) 4 MG/ML IV SOLN
4.0000 mg | Freq: Once | INTRAVENOUS | Status: AC
  Administered 2015-12-05: 4 mg via INTRAVENOUS
  Filled 2015-12-05: qty 1

## 2015-12-05 MED ORDER — ONDANSETRON HCL 4 MG/2ML IJ SOLN
4.0000 mg | Freq: Once | INTRAMUSCULAR | Status: AC
  Administered 2015-12-05: 4 mg via INTRAVENOUS
  Filled 2015-12-05: qty 2

## 2015-12-05 MED ORDER — PHENAZOPYRIDINE HCL 200 MG PO TABS: 200.0000 mg | ORAL_TABLET | Freq: Three times a day (TID) | ORAL | Status: DC

## 2015-12-05 MED ORDER — DEXTROSE 5 % IV SOLN
1.0000 g | Freq: Once | INTRAVENOUS | Status: AC
  Administered 2015-12-05: 1 g via INTRAVENOUS
  Filled 2015-12-05: qty 10

## 2015-12-05 MED ORDER — KETOROLAC TROMETHAMINE 30 MG/ML IJ SOLN
30.0000 mg | Freq: Once | INTRAMUSCULAR | Status: AC
  Administered 2015-12-05: 30 mg via INTRAVENOUS
  Filled 2015-12-05: qty 1

## 2015-12-05 MED ORDER — CEPHALEXIN 500 MG PO CAPS: 500.0000 mg | ORAL_CAPSULE | Freq: Four times a day (QID) | ORAL | Status: DC

## 2015-12-05 MED ORDER — TRAMADOL HCL 50 MG PO TABS: 50.0000 mg | ORAL_TABLET | Freq: Four times a day (QID) | ORAL | Status: DC | PRN

## 2015-12-05 NOTE — Discharge Instructions (Signed)

## 2015-12-05 NOTE — ED Notes (Signed)
Having pain to left flank area.  Urine is foul smelling and urine is cloudy.  History of UTI.  Rates pain 10/10.

## 2015-12-05 NOTE — ED Provider Notes (Signed)
CSN: [id number]     Arrival date & time 12/05/15  1118 History   First MD Initiated Contact with Patient 12/05/15 1142     Chief Complaint  Patient presents with  . Flank Pain    right     (Consider location/radiation/quality/duration/timing/severity/associated sxs/prior Treatment) The history is provided by the patient and a parent.   Brooke Espinoza is a 30 y.o. female with past medical history outlined below and reported frequent urinary tract infections presenting with a sudden onset of right flank pain starting at 4 AM today, stating it woke her from sleep.  She has had increased urinary frequency along with cloudy urine and dysuria for the past several days.  She had left over amoxicillin from it previous prescription which she has taken for the last 2 days and has had no relief of symptoms.  She reports constant 10/10 sharp pain and has found no alleviating.  She denies fevers or chills, she does endorse nausea without emesis.  No abdominal pain, but endorses suprapubic pressure and burning with urination.  She has no vaginal discharge.  There is a strong family history of kidney stones but she has never had this diagnosis.  She has taken no medications for treatment of pain and has found no alleviators.     Past Medical History  Diagnosis Date  . Asthma     as a child  . Sleep apnea   . Depression   . Gestational diabetes    Past Surgical History  Procedure Laterality Date  . Elbow surgery Left   . Laparoscopic bilateral salpingectomy Bilateral 01/01/2013    Procedure: LAPAROSCOPIC BILATERAL SALPINGECTOMY;  Surgeon: Tilda Burrow, MD;  Location: [facility];  Service: Gynecology;  Laterality: Bilateral;   Family History  Problem Relation Age of Onset  . Hypertension Mother   . Mental illness Mother     no specific diagnosis referred to as a chemical imbalance  . Depression Mother   . Hypertension Father   . Thyroid disease Father   . Heart disease Father   . Depression  Father   . Anxiety disorder Father   . Heart disease Maternal Grandmother   . Diabetes Maternal Grandmother   . Hypertension Maternal Grandmother   . Dementia Maternal Grandfather    Social History  Substance Use Topics  . Smoking status: Former Smoker    Types: Cigarettes    Quit date: 04/01/2015  . Smokeless tobacco: Never Used  . Alcohol Use: 0.6 oz/week    1 Glasses of wine per week     Comment: seldom   OB History    Gravida Para Term Preterm AB TAB SAB Ectopic Multiple Living   4 3 3  0 1 0 0 1 0 3     Review of Systems  Constitutional: Negative for fever and chills.  HENT: Negative for congestion and sore throat.   Eyes: Negative.   Respiratory: Negative for chest tightness and shortness of breath.   Cardiovascular: Negative for chest pain.  Gastrointestinal: Positive for nausea. Negative for vomiting and abdominal pain.  Genitourinary: Positive for dysuria, urgency, frequency and flank pain. Negative for hematuria and vaginal discharge.  Musculoskeletal: Negative for joint swelling, arthralgias and neck pain.  Skin: Negative.  Negative for rash and wound.  Neurological: Negative for dizziness, weakness, light-headedness, numbness and headaches.  Psychiatric/Behavioral: Negative.       Allergies  Review of patient's allergies indicates no known allergies.  Home Medications   Prior to Admission medications  Medication Sig Start Date End Date Taking? Authorizing Provider  amphetamine-dextroamphetamine (ADDERALL) 20 MG tablet Take 1 tablet by mouth 2 (two) times daily. 11/06/15 11/05/16 Yes Historical Provider, MD  cephALEXin (KEFLEX) 500 MG capsule Take 1 capsule (500 mg total) by mouth 4 (four) times daily. 12/05/15   Burgess Amor, PA-C  phenazopyridine (PYRIDIUM) 200 MG tablet Take 1 tablet (200 mg total) by mouth 3 (three) times daily. 12/05/15   Burgess Amor, PA-C  QUEtiapine (SEROQUEL) 50 MG tablet Take 1 tablet (50 mg total) by mouth 2 (two) times daily. 12/04/14  12/04/15  Thresa Ross, MD  traMADol (ULTRAM) 50 MG tablet Take 1 tablet (50 mg total) by mouth every 6 (six) hours as needed. 12/05/15   Burgess Amor, PA-C   BP 117/60 mmHg  Pulse 68  Temp(Src) 99.4 F (37.4 C) (Oral)  Resp 18  Ht  (1.626 m)  Wt 99.791 kg  BMI 37.74 kg/m2  SpO2 99%  LMP 11/14/2015 Physical Exam  Constitutional: She appears well-developed and well-nourished.  Non-toxic appearance. She appears distressed.  Patient tearful upon first exam.  She is lying prone on the bed with feet on the floor.  HENT:  Head: Normocephalic and atraumatic.  Eyes: Conjunctivae are normal.  Neck: Normal range of motion.  Cardiovascular: Normal rate, regular rhythm, normal heart sounds and intact distal pulses.   Pulmonary/Chest: Effort normal and breath sounds normal. She has no wheezes.  Abdominal: Soft. Bowel sounds are normal. She exhibits no distension. There is tenderness in the suprapubic area. There is CVA tenderness. There is no rebound and no guarding.  CVA and flank pain right with even slight palpation of skin.  No edema or erythema, no rash.  Musculoskeletal: Normal range of motion.  Neurological: She is alert.  Skin: Skin is warm and dry.  Psychiatric: She has a normal mood and affect.  Nursing note and vitals reviewed.   ED Course  Procedures (including critical care time) Labs Review Labs Reviewed  URINALYSIS, ROUTINE W REFLEX MICROSCOPIC (NOT AT [facility]) - Abnormal; Notable for the following:    APPearance CLOUDY (*)    Hgb urine dipstick MODERATE (*)    Protein, ur 30 (*)    Nitrite POSITIVE (*)    Leukocytes, UA LARGE (*)    All other components within normal limits  URINE MICROSCOPIC-ADD ON - Abnormal; Notable for the following:    Squamous Epithelial / LPF 0-5 (*)    Bacteria, UA MANY (*)    All other components within normal limits  URINE CULTURE  PREGNANCY, URINE    Imaging Review Ct Renal Stone Study  12/05/2015  CLINICAL DATA:  Right flank pain  since 4 a.m. today. Dysuria for several days. EXAM: CT ABDOMEN AND PELVIS WITHOUT CONTRAST TECHNIQUE: Multidetector CT imaging of the abdomen and pelvis was performed following the standard protocol without IV contrast. COMPARISON:  None. FINDINGS: Lower chest:  Clear lung bases. Hepatobiliary: Normal appearing liver and gallbladder. Pancreas: No mass, inflammatory changes, or other significant abnormality. Spleen: Within normal limits in size and appearance. Adrenals/Urinary Tract: Normal appearing adrenal glands, kidneys, ureters and urinary bladder. No urinary tract calculi or hydronephrosis. Stomach/Bowel: No gastrointestinal abnormalities. Normal appearing appendix. Vascular/Lymphatic: No pathologically enlarged lymph nodes. No evidence of abdominal aortic aneurysm. Reproductive: No mass or other significant abnormality. Other: Very small umbilical hernia containing fat. Musculoskeletal:  Unremarkable bones. IMPRESSION: No acute abnormality. No urinary tract calculi or hydronephrosis seen. Electronically Signed   By: Zada Finders.D.  On: 12/05/2015 13:47   I have personally reviewed and evaluated these images and lab results as part of my medical decision-making.   EKG Interpretation None      MDM   Final diagnoses:  Flank pain  UTI (lower urinary tract infection)    Patients labs reviewed.  Radiological studies were viewed, interpreted and considered during the medical decision making and disposition process. I agree with radiologists reading.  Results were also discussed with patient.   Urine culture was ordered on this patient.  She was given Rocephin 1 g IV while awaiting CT imaging.  Results were discussed with patient, no pyelonephritis, no kidney or ureteral stones.  She appeared much more comfortable at time of discharge.  She was sleeping prior to discharge home.  She was switched to Keflex 500 mg 4 times a day, also prescribed Pyridium and tramadol when necessary for symptom  relief.  She was encouraged to follow-up with her PCP for recheck early this week if symptoms persist or worsen.  Father at bedside will be driving patient home.  The patient appears reasonably screened and/or stabilized for discharge and I doubt any other medical condition or other [facility] requiring further screening, evaluation, or treatment in the ED at this time prior to discharge.   Burgess AmorJulie Akirra Lacerda, PA-C 12/05/15 1716  Bethann BerkshireJoseph Zammit, MD 12/06/15 [phone]

## 2015-12-08 LAB — URINE CULTURE

## 2015-12-09 ENCOUNTER — Telehealth (HOSPITAL_BASED_OUTPATIENT_CLINIC_OR_DEPARTMENT_OTHER): Payer: Self-pay | Admitting: Emergency Medicine

## 2015-12-09 NOTE — Telephone Encounter (Signed)
Post ED Visit - Positive Culture Follow-up  Culture report reviewed by antimicrobial stewardship pharmacist:  []  Enzo BiNathan Batchelder, Pharm.D. []  Celedonio MiyamotoJeremy Frens, Pharm.D., BCPS []  Garvin FilaMike Maccia, Pharm.D. []  Georgina PillionElizabeth Martin, Pharm.D., BCPS []  [place] BuffaloMinh Pham, [address] Rainbow BoulevardPharm.D., BCPS, AAHIVP []  Estella HuskMichelle Turner, Pharm.D., BCPS, AAHIVP [x]  Tennis Mustassie Stewart, Pharm.D. []  Rob Oswaldo DoneVincent, Vermont[place]arm.D.  Positive urine  culture Treated with cephalexin, organism sensitive to the same and no further patient follow-up is required at this time.  Berle MullMiller, Reginaldo Hazard 12/09/2015, 11:30 AM

## 2016-11-08 IMAGING — DX DG CHEST 2V
2 series · 2 of 2 positions shown · non-contrast
Comparison: None.

CLINICAL DATA: 21-year-old female with intermittently productive
cough for the past 5 days

EXAM:
CHEST  2 VIEW

[chest pa]
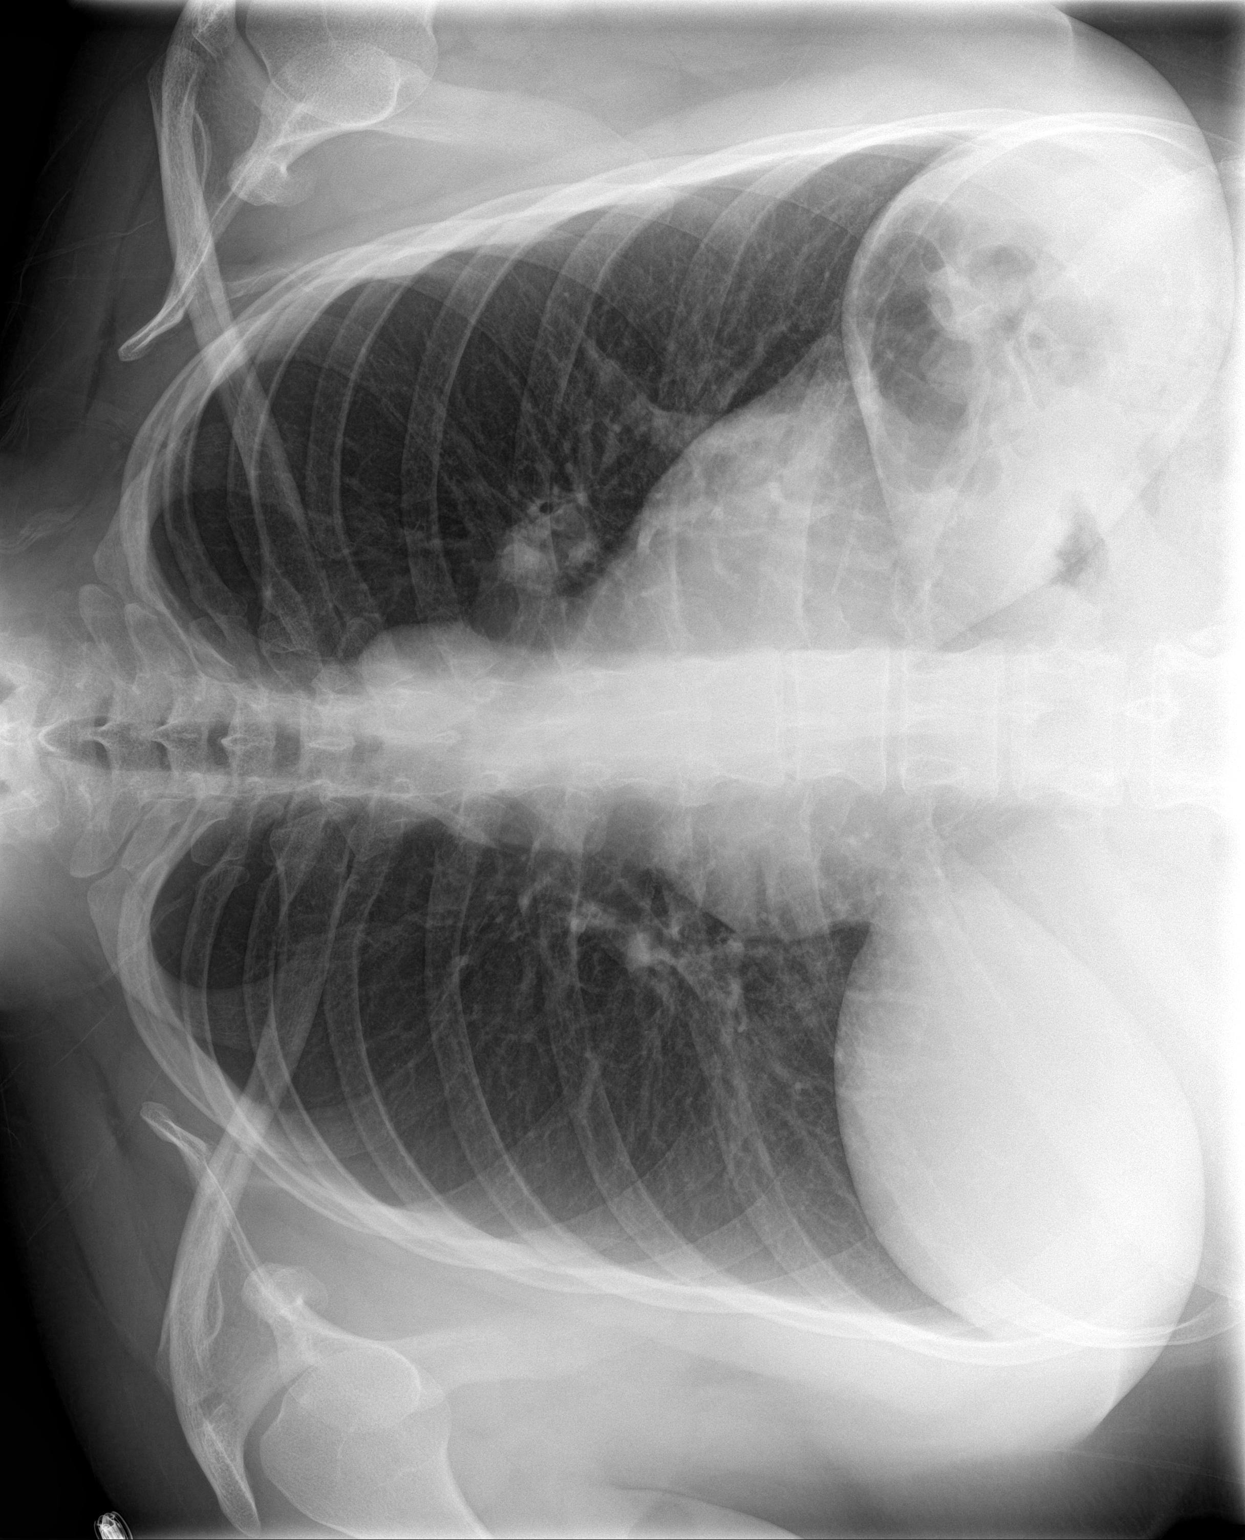

[chest lat]
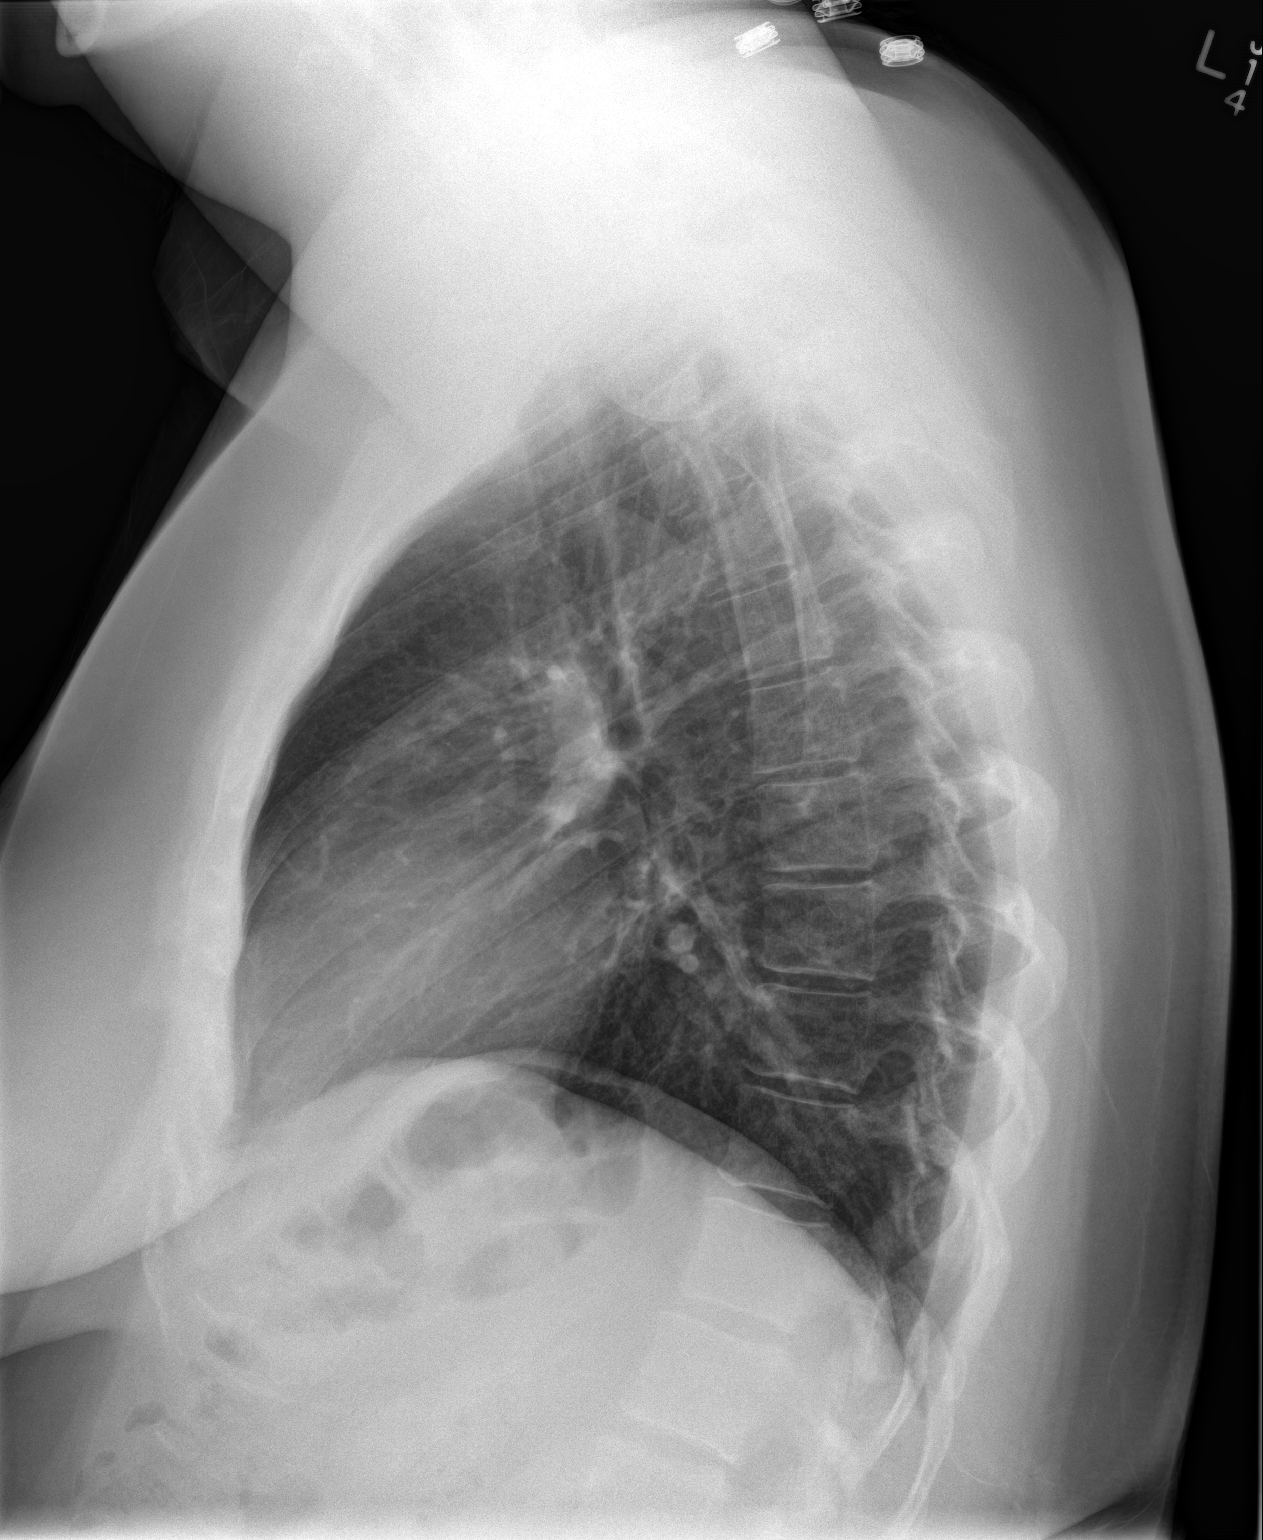

[2 of 2 positions shown; findings below may reference images not displayed]

FINDINGS: The lungs are clear and negative for focal airspace consolidation,
pulmonary edema or suspicious pulmonary nodule. Curvilinear/tubular
opacity partially projecting over the left cardiac margin on the
frontal view cannot be identified on the lateral view and is favored
to represent a serendipitous concatenation of shadows. No pleural
effusion or pneumothorax. Cardiac and mediastinal contours are
within normal limits. No acute fracture or lytic or blastic osseous
lesions. The visualized upper abdominal bowel gas pattern is
unremarkable.
IMPRESSION: No active cardiopulmonary disease.

## 2017-08-20 ENCOUNTER — Encounter (HOSPITAL_COMMUNITY): Payer: Self-pay | Admitting: Emergency Medicine

## 2017-08-20 ENCOUNTER — Emergency Department (HOSPITAL_COMMUNITY)
Admission: EM | Admit: 2017-08-20 | Discharge: 2017-08-20 | Disposition: A | Discharge status: Home / Self Care | Payer: BLUE CROSS/BLUE SHIELD | Attending: Emergency Medicine | Admitting: Emergency Medicine

## 2017-08-20 ENCOUNTER — Other Ambulatory Visit: Payer: Self-pay

## 2017-08-20 DIAGNOSIS — R55 Syncope and collapse: Secondary | ICD-10-CM | POA: Insufficient documentation

## 2017-08-20 DIAGNOSIS — R109 Unspecified abdominal pain: Secondary | ICD-10-CM | POA: Insufficient documentation

## 2017-08-20 DIAGNOSIS — R42 Dizziness and giddiness: Secondary | ICD-10-CM | POA: Insufficient documentation

## 2017-08-20 DIAGNOSIS — Z5321 Procedure and treatment not carried out due to patient leaving prior to being seen by health care provider: Secondary | ICD-10-CM | POA: Insufficient documentation

## 2017-08-20 HISTORY — DX: Disorder of kidney and ureter, unspecified: N28.9

## 2017-08-20 HISTORY — DX: Urinary tract infection, site not specified: N39.0

## 2017-08-20 LAB — BASIC METABOLIC PANEL
ANION GAP: 10 (ref 5–15)
BUN: 12 mg/dL (ref 6–20)
CALCIUM: 8.9 mg/dL (ref 8.9–10.3)
CO2: 26 mmol/L (ref 22–32)
Chloride: 104 mmol/L (ref 101–111)
Creatinine, Ser: 0.78 mg/dL (ref 0.44–1.00)
GFR calc Af Amer: >60 mL/min (ref >60)
Glucose, Bld: 114 mg/dL — ABNORMAL HIGH (ref 65–99)
POTASSIUM: 3.2 mmol/L — AB (ref 3.5–5.1)
SODIUM: 140 mmol/L (ref 135–145)

## 2017-08-20 LAB — URINALYSIS, ROUTINE W REFLEX MICROSCOPIC
Bilirubin Urine: NEGATIVE
Glucose, UA: NEGATIVE mg/dL
KETONES UR: NEGATIVE mg/dL
Nitrite: POSITIVE — AB
Protein, ur: 100 mg/dL — AB
SQUAMOUS EPITHELIAL / LPF: NONE SEEN
Specific Gravity, Urine: 1.013 (ref 1.005–1.030)
pH: 5 (ref 5.0–8.0)

## 2017-08-20 LAB — CBC
HEMATOCRIT: 42.6 % (ref 36.0–46.0)
Hemoglobin: 13.7 g/dL (ref 12.0–15.0)
MCH: 28.7 pg (ref 26.0–34.0)
MCHC: 32.2 g/dL (ref 30.0–36.0)
MCV: 89.1 fL (ref 78.0–100.0)
Platelets: 162 10*3/uL (ref 150–400)
RBC: 4.78 MIL/uL (ref 3.87–5.11)
RDW: 13.8 % (ref 11.5–15.5)
WBC: 13.6 10*3/uL — AB (ref 4.0–10.5)

## 2017-08-20 LAB — CBG MONITORING, ED: Glucose-Capillary: 139 mg/dL — ABNORMAL HIGH (ref 65–99)

## 2017-08-20 LAB — PREGNANCY, URINE: PREG TEST UR: NEGATIVE

## 2017-08-20 MED ORDER — ACETAMINOPHEN 325 MG PO TABS
650.0000 mg | ORAL_TABLET | Freq: Once | ORAL | Status: AC | PRN
  Administered 2017-08-20: 650 mg via ORAL
  Filled 2017-08-20: qty 2

## 2017-08-20 NOTE — ED Notes (Signed)
Called for room placement, no answer.

## 2017-08-20 NOTE — ED Notes (Signed)
Called and pt was not in the waiting area x 3.

## 2017-08-20 NOTE — ED Notes (Signed)
Pt not in the waiting area x 1. 

## 2017-08-20 NOTE — ED Triage Notes (Signed)
Patient c/o bilateral flank pain, worse pain on right. Patient states dysuria and nausea. Per patient taking azo with no relief. Patient reports patient has had dizziness with syncopal episode lasting approx 2 minutes. Patient has extensive hx of UTIs and kidney stones.

## 2017-08-21 NOTE — ED Notes (Signed)
08/21/2017, 17:50  Follow-up call completed.

## 2017-08-22 ENCOUNTER — Inpatient Hospital Stay (HOSPITAL_COMMUNITY)
Admission: EM | Admit: 2017-08-22 | Discharge: 2017-08-24 | DRG: 872 | Disposition: A | Discharge status: Home / Self Care | Payer: BLUE CROSS/BLUE SHIELD | Attending: Family Medicine | Admitting: Family Medicine

## 2017-08-22 ENCOUNTER — Other Ambulatory Visit: Payer: Self-pay

## 2017-08-22 ENCOUNTER — Encounter (HOSPITAL_COMMUNITY): Payer: Self-pay | Admitting: Internal Medicine

## 2017-08-22 ENCOUNTER — Emergency Department (HOSPITAL_COMMUNITY): Payer: BLUE CROSS/BLUE SHIELD

## 2017-08-22 DIAGNOSIS — D696 Thrombocytopenia, unspecified: Secondary | ICD-10-CM | POA: Diagnosis present

## 2017-08-22 DIAGNOSIS — I248 Other forms of acute ischemic heart disease: Secondary | ICD-10-CM | POA: Diagnosis present

## 2017-08-22 DIAGNOSIS — F909 Attention-deficit hyperactivity disorder, unspecified type: Secondary | ICD-10-CM | POA: Diagnosis present

## 2017-08-22 DIAGNOSIS — A4151 Sepsis due to Escherichia coli [E. coli]: Secondary | ICD-10-CM | POA: Diagnosis present

## 2017-08-22 DIAGNOSIS — G4733 Obstructive sleep apnea (adult) (pediatric): Secondary | ICD-10-CM | POA: Diagnosis present

## 2017-08-22 DIAGNOSIS — F17210 Nicotine dependence, cigarettes, uncomplicated: Secondary | ICD-10-CM | POA: Diagnosis present

## 2017-08-22 DIAGNOSIS — E876 Hypokalemia: Secondary | ICD-10-CM | POA: Diagnosis present

## 2017-08-22 DIAGNOSIS — N179 Acute kidney failure, unspecified: Secondary | ICD-10-CM

## 2017-08-22 DIAGNOSIS — I959 Hypotension, unspecified: Secondary | ICD-10-CM | POA: Diagnosis present

## 2017-08-22 DIAGNOSIS — J45909 Unspecified asthma, uncomplicated: Secondary | ICD-10-CM | POA: Diagnosis present

## 2017-08-22 DIAGNOSIS — F329 Major depressive disorder, single episode, unspecified: Secondary | ICD-10-CM | POA: Diagnosis present

## 2017-08-22 DIAGNOSIS — Z79899 Other long term (current) drug therapy: Secondary | ICD-10-CM | POA: Diagnosis not present

## 2017-08-22 DIAGNOSIS — N12 Tubulo-interstitial nephritis, not specified as acute or chronic: Secondary | ICD-10-CM

## 2017-08-22 DIAGNOSIS — D649 Anemia, unspecified: Secondary | ICD-10-CM | POA: Diagnosis present

## 2017-08-22 DIAGNOSIS — I361 Nonrheumatic tricuspid (valve) insufficiency: Secondary | ICD-10-CM | POA: Diagnosis not present

## 2017-08-22 DIAGNOSIS — F411 Generalized anxiety disorder: Secondary | ICD-10-CM | POA: Diagnosis present

## 2017-08-22 LAB — CBC WITH DIFFERENTIAL/PLATELET
BASOS ABS: 0 10*3/uL (ref 0.0–0.1)
Basophils Relative: 0 %
EOS ABS: 0 10*3/uL (ref 0.0–0.7)
EOS PCT: 0 %
HCT: 33.9 % — ABNORMAL LOW (ref 36.0–46.0)
HEMOGLOBIN: 11 g/dL — AB (ref 12.0–15.0)
LYMPHS PCT: 5 %
Lymphs Abs: 0.4 10*3/uL (ref 0.7–4.0)
MCH: 28.9 pg (ref 26.0–34.0)
MCHC: 32.4 g/dL (ref 30.0–36.0)
MCV: 89.2 fL (ref 78.0–100.0)
Monocytes Absolute: 0.4 10*3/uL (ref 0.1–1.0)
Monocytes Relative: 4 %
NEUTROS PCT: 91 %
Neutro Abs: 8.8 10*3/uL (ref 1.7–7.7)
PLATELETS: 92 10*3/uL — AB (ref 150–400)
RBC: 3.8 MIL/uL — ABNORMAL LOW (ref 3.87–5.11)
RDW: 14.1 % (ref 11.5–15.5)
WBC: 9.6 10*3/uL (ref 4.0–10.5)

## 2017-08-22 LAB — COMPREHENSIVE METABOLIC PANEL
ALT: 18 U/L (ref 14–54)
AST: 21 U/L (ref 15–41)
Albumin: 2.3 g/dL — ABNORMAL LOW (ref 3.5–5.0)
Alkaline Phosphatase: 81 U/L (ref 38–126)
Anion gap: 9 (ref 5–15)
BUN: 25 mg/dL — AB (ref 6–20)
CHLORIDE: 106 mmol/L (ref 101–111)
CO2: 23 mmol/L (ref 22–32)
CREATININE: 1.7 mg/dL — AB (ref 0.44–1.00)
Calcium: 7.8 mg/dL — ABNORMAL LOW (ref 8.9–10.3)
GFR calc Af Amer: 46 mL/min — ABNORMAL LOW (ref >60)
GFR calc non Af Amer: 39 mL/min — ABNORMAL LOW (ref >60)
GLUCOSE: 141 mg/dL — AB (ref 65–99)
Potassium: 3 mmol/L — ABNORMAL LOW (ref 3.5–5.1)
SODIUM: 138 mmol/L (ref 135–145)
Total Bilirubin: 0.5 mg/dL (ref 0.3–1.2)
Total Protein: 5.2 g/dL — ABNORMAL LOW (ref 6.5–8.1)

## 2017-08-22 LAB — URINALYSIS, ROUTINE W REFLEX MICROSCOPIC
BILIRUBIN URINE: NEGATIVE
Glucose, UA: NEGATIVE mg/dL
KETONES UR: NEGATIVE mg/dL
Nitrite: POSITIVE — AB
PH: 5 (ref 5.0–8.0)
Protein, ur: 100 mg/dL — AB
Specific Gravity, Urine: 1.014 (ref 1.005–1.030)

## 2017-08-22 LAB — URINE CULTURE

## 2017-08-22 LAB — TROPONIN I: TROPONIN I: 0.19 ng/mL — AB (ref <0.03)

## 2017-08-22 LAB — LIPASE, BLOOD: Lipase: 18 U/L (ref 11–51)

## 2017-08-22 LAB — CBG MONITORING, ED: GLUCOSE-CAPILLARY: 126 mg/dL — AB (ref 65–99)

## 2017-08-22 LAB — LACTATE DEHYDROGENASE: LDH: 122 U/L (ref 98–192)

## 2017-08-22 LAB — POC URINE PREG, ED: PREG TEST UR: NEGATIVE

## 2017-08-22 MED ORDER — SODIUM CHLORIDE 0.9 % IV BOLUS (SEPSIS)
1000.0000 mL | Freq: Once | INTRAVENOUS | Status: AC
  Administered 2017-08-22: 1000 mL via INTRAVENOUS

## 2017-08-22 MED ORDER — SODIUM CHLORIDE 0.9 % IV SOLN
INTRAVENOUS | Status: DC
  Administered 2017-08-23 – 2017-08-24 (×3): via INTRAVENOUS

## 2017-08-22 MED ORDER — SODIUM CHLORIDE 0.9 % IV BOLUS (SEPSIS)
500.0000 mL | Freq: Once | INTRAVENOUS | Status: AC
  Administered 2017-08-22: 500 mL via INTRAVENOUS

## 2017-08-22 MED ORDER — PRO-STAT SUGAR FREE PO LIQD
30.0000 mL | Freq: Two times a day (BID) | ORAL | Status: DC
  Administered 2017-08-23: 30 mL via ORAL
  Filled 2017-08-22: qty 30

## 2017-08-22 MED ORDER — ENOXAPARIN SODIUM 30 MG/0.3ML ~~LOC~~ SOLN: 30.0000 mg | SUBCUTANEOUS | Status: DC

## 2017-08-22 MED ORDER — SODIUM CHLORIDE 0.9 % IV SOLN
1.0000 g | INTRAVENOUS | Status: DC
  Administered 2017-08-23: 1 g via INTRAVENOUS
  Filled 2017-08-22: qty 10
  Filled 2017-08-22: qty 1
  Filled 2017-08-22: qty 10

## 2017-08-22 MED ORDER — KETOROLAC TROMETHAMINE 30 MG/ML IJ SOLN
30.0000 mg | Freq: Once | INTRAMUSCULAR | Status: AC
  Administered 2017-08-22: 30 mg via INTRAVENOUS
  Filled 2017-08-22: qty 1

## 2017-08-22 MED ORDER — ACETAMINOPHEN 325 MG PO TABS
650.0000 mg | ORAL_TABLET | Freq: Four times a day (QID) | ORAL | Status: DC | PRN
  Administered 2017-08-22 – 2017-08-24 (×3): 650 mg via ORAL
  Filled 2017-08-22 (×4): qty 2

## 2017-08-22 MED ORDER — HYDROCODONE-ACETAMINOPHEN 5-325 MG PO TABS
1.0000 | ORAL_TABLET | Freq: Four times a day (QID) | ORAL | Status: DC | PRN
  Administered 2017-08-22 – 2017-08-23 (×3): 1 via ORAL
  Filled 2017-08-22 (×3): qty 1

## 2017-08-22 MED ORDER — SODIUM CHLORIDE 0.9 % IV SOLN
2.0000 g | Freq: Once | INTRAVENOUS | Status: AC
  Administered 2017-08-22: 2 g via INTRAVENOUS
  Filled 2017-08-22: qty 20

## 2017-08-22 MED ORDER — ENOXAPARIN SODIUM 40 MG/0.4ML ~~LOC~~ SOLN: 40.0000 mg | SUBCUTANEOUS | Status: DC

## 2017-08-22 MED ORDER — SODIUM CHLORIDE 0.9 % IV BOLUS (SEPSIS): 1000.0000 mL | Freq: Once | INTRAVENOUS | Status: DC

## 2017-08-22 MED ORDER — SODIUM CHLORIDE 0.9 % IV SOLN
Freq: Once | INTRAVENOUS | Status: AC
  Administered 2017-08-22: 15:00:00 via INTRAVENOUS

## 2017-08-22 MED ORDER — AMPHETAMINE-DEXTROAMPHETAMINE 10 MG PO TABS
20.0000 mg | ORAL_TABLET | Freq: Two times a day (BID) | ORAL | Status: DC
  Administered 2017-08-23 – 2017-08-24 (×2): 20 mg via ORAL
  Filled 2017-08-22 (×3): qty 2

## 2017-08-22 MED ORDER — CLONAZEPAM 0.5 MG PO TABS
1.0000 mg | ORAL_TABLET | Freq: Three times a day (TID) | ORAL | Status: DC | PRN
  Administered 2017-08-23 – 2017-08-24 (×2): 1 mg via ORAL
  Filled 2017-08-22 (×2): qty 2

## 2017-08-22 MED ORDER — ESCITALOPRAM OXALATE 10 MG PO TABS
20.0000 mg | ORAL_TABLET | Freq: Every day | ORAL | Status: DC
  Administered 2017-08-23 – 2017-08-24 (×2): 20 mg via ORAL
  Filled 2017-08-22 (×2): qty 2

## 2017-08-22 NOTE — ED Notes (Signed)
Patient has 2nd 1000 ML bag of NS infusing at this time via EMS bag.

## 2017-08-22 NOTE — H&P (Addendum)
TRH H&P   Patient Demographics:    Brooke Espinoza, is a 32 y.o. female  MRN: [id number]   DOB - Mar 13, 1986  Admit Date - 08/22/2017  Outpatient Primary MD for the patient is Hemberg, Ruby Cola, NP  Referring MD/NP/PA: Donnetta Hutching  Outpatient Specialists:   Patient coming from: home  Chief Complaint  Patient presents with  . Hypotension  . Flank Pain      HPI:    Brooke Espinoza  is a 32 y.o. female, w OSA, Asthma, Smoker, presents with c/o right >left side flank pain, felt weak per pt. Slight dysuria. Denies fever,  hematuria.  Pt went to urgent care and sent to hospital for evaluation.   In ED,  T 102.8, P 111, Bp 108/62    CXR IMPRESSION: Left basilar subsegmental atelectasis.  No active disease.  Wbc 9.6, Hgb 11.0, Plt 92 Na 138, K 3.0 Bun 25, Creatinine 1.70 Alb 2.3 Lipase 18  Urinalysis wbc tntc , rbc tntc  Pt will be admitted for UTI, pyelonephritis, hypokalemia, and ARF.      Review of systems:    In addition to the HPI above,  No Fever-chills, No Headache, No changes with Vision or hearing, No problems swallowing food or Liquids, No Chest pain, Cough or Shortness of Breath, No Abdominal pain, No Nausea or Vommitting, Bowel movements are regular, No Blood in stool or Urine,  No new skin rashes or bruises, No new joints pains-aches,  No new weakness, tingling, numbness in any extremity, No recent weight gain or loss, No polyuria, polydypsia or polyphagia, No significant Mental Stressors.  A full 10 point Review of Systems was done, except as stated above, all other Review of Systems were negative.   With Past History of the following :    Past Medical History:  Diagnosis Date  . Asthma    as a child  . Depression   . Gestational diabetes   . Recurrent UTI   . Renal disorder   . Sleep apnea       Past Surgical History:    Procedure Laterality Date  . ELBOW SURGERY Left   . LAPAROSCOPIC BILATERAL SALPINGECTOMY Bilateral 01/01/2013   Procedure: LAPAROSCOPIC BILATERAL SALPINGECTOMY;  Surgeon: Tilda Burrow, MD;  Location: [facility];  Service: Gynecology;  Laterality: Bilateral;  . TUBAL LIGATION        Social History:     Social History   Tobacco Use  . Smoking status: Current Every Day Smoker    Packs/day: 1.00    Types: Cigarettes    Last attempt to quit: 04/01/2015    Years since quitting: 2.3  . Smokeless tobacco: Never Used  Substance Use Topics  . Alcohol use: Yes    Alcohol/week: 0.6 oz    Types: 1 Glasses of wine per week     Lives - at home  Mobility - walks  by self   Family History :     Family History  Problem Relation Age of Onset  . Hypertension Mother   . Mental illness Mother        no specific diagnosis referred to as a chemical imbalance  . Depression Mother   . Hypertension Father   . Thyroid disease Father   . Heart disease Father   . Depression Father   . Anxiety disorder Father   . Heart disease Maternal Grandmother   . Diabetes Maternal Grandmother   . Hypertension Maternal Grandmother   . Dementia Maternal Grandfather       Home Medications:   Prior to Admission medications   Medication Sig Start Date End Date Taking? Authorizing Provider  acetaminophen (TYLENOL) 500 MG tablet Take 1,000 mg by mouth every 6 (six) hours as needed.   Yes [provider]  amphetamine-dextroamphetamine (ADDERALL) 20 MG tablet Take 1 tablet by mouth 2 (two) times daily. 11/06/15 08/22/17 Yes [provider]  clonazePAM (KLONOPIN) 1 MG tablet Take 1 mg by mouth 3 (three) times daily.   Yes [provider]  escitalopram (LEXAPRO) 20 MG tablet Take 20 mg by mouth daily.   Yes [provider]  Naproxen Sodium ([employer] NAPROXEN SODIUM) 220 MG CAPS Take 220 mg by mouth daily as needed (for pain).   Yes [provider]     Allergies:    No  Known Allergies   Physical Exam:   Vitals  Blood pressure (!) 88/57, pulse 98, temperature 98.3 F (36.8 C), temperature source Oral, resp. rate 20, height 5\' 7"  (1.702 m), weight 81.6 kg (180 lb), last menstrual period 08/06/2017, SpO2 100 %.   1. General  lying in bed in NAD,   2. Normal affect and insight, Not Suicidal or Homicidal, Awake Alert, Oriented X 3.  3. No F.N deficits, ALL C.Nerves Intact, Strength 5/5 all 4 extremities, Sensation intact all 4 extremities, Plantars down going.  4. Ears and Eyes appear Normal, Conjunctivae clear, PERRLA. Moist Oral Mucosa.  5. Supple Neck, No JVD, No cervical lymphadenopathy appriciated, No Carotid Bruits.  6. Symmetrical Chest wall movement, Good air movement bilaterally, CTAB.  7. RRR, No Gallops, Rubs or Murmurs, No Parasternal Heave.  8. Positive Bowel Sounds, Abdomen Soft, No tenderness, No organomegaly appriciated,No rebound -guarding or rigidity.  9.  No Cyanosis, Normal Skin Turgor, No Skin Rash or Bruise.  10. Good muscle tone,  joints appear normal , no effusions, Normal ROM.  11. No Palpable Lymph Nodes in Neck or Axillae  Slight CVA tenderness   Data Review:    CBC Recent Labs  Lab 08/20/17 1521 08/22/17 1125  WBC 13.6* 9.6  HGB 13.7 11.0*  HCT 42.6 33.9*  PLT 162 92*  MCV 89.1 89.2  MCH 28.7 28.9  MCHC 32.2 32.4  RDW 13.8 14.1  LYMPHSABS  --  0.4  MONOABS  --  0.4  EOSABS  --  0.0  BASOSABS  --  0.0   ------------------------------------------------------------------------------------------------------------------  Chemistries  Recent Labs  Lab 08/20/17 1521 08/22/17 1125  NA 140 138  K 3.2* 3.0*  CL 104 106  CO2 26 23  GLUCOSE 114* 141*  BUN 12 25*  CREATININE 0.78 1.70*  CALCIUM 8.9 7.8*  AST  --  21  ALT  --  18  ALKPHOS  --  81  BILITOT  --  0.5   ------------------------------------------------------------------------------------------------------------------ estimated  creatinine clearance is 53.2 mL/min (A) (by C-G formula based on SCr  of 1.7 mg/dL (H)). ------------------------------------------------------------------------------------------------------------------ No results for input(s): TSH, T4TOTAL, T3FREE, THYROIDAB in the last 72 hours.  Invalid input(s): FREET3  Coagulation profile No results for input(s): INR, PROTIME in the last 168 hours. ------------------------------------------------------------------------------------------------------------------- No results for input(s): DDIMER in the last 72 hours. -------------------------------------------------------------------------------------------------------------------  Cardiac Enzymes No results for input(s): CKMB, TROPONINI, MYOGLOBIN in the last 168 hours.  Invalid input(s): CK ------------------------------------------------------------------------------------------------------------------ No results found for: BNP   ---------------------------------------------------------------------------------------------------------------  Urinalysis    Component Value Date/Time   COLORURINE AMBER (A) 08/22/2017 1126   APPEARANCEUR TURBID (A) 08/22/2017 1126   LABSPEC 1.014 08/22/2017 1126   PHURINE 5.0 08/22/2017 1126   GLUCOSEU NEGATIVE 08/22/2017 1126   HGBUR MODERATE (A) 08/22/2017 1126   BILIRUBINUR NEGATIVE 08/22/2017 1126   KETONESUR NEGATIVE 08/22/2017 1126   PROTEINUR 100 (A) 08/22/2017 1126   UROBILINOGEN 0.2 12/26/2012 1107   NITRITE POSITIVE (A) 08/22/2017 1126   LEUKOCYTESUR MODERATE (A) 08/22/2017 1126    ----------------------------------------------------------------------------------------------------------------   Imaging Results:    Dg Chest Port 1 View  Result Date: 08/22/2017 CLINICAL DATA:  Hypotension. EXAM: PORTABLE CHEST 1 VIEW COMPARISON:  Chest x-ray dated September 03, 2014. FINDINGS: The heart size and mediastinal contours are within normal limits.  Normal pulmonary vascularity. Subsegmental atelectasis at the left lung base. No focal consolidation, pleural effusion, or pneumothorax. No acute osseous abnormality. IMPRESSION: Left basilar subsegmental atelectasis.  No active disease. Electronically Signed   By: Obie Dredge M.D.   On: 08/22/2017 11:44     nsr at    Assessment & Plan:    Active Problems:   Pyelonephritis   Hypotension    Pyelonephritis, UTI Awaiting urine culture Rocephin 1gm iv qday Check cbc in am  Hypotension Tele Trop I q6h x3 Check cardiac echo Hydrate with ns iv If persistent consider cortisol level  ARF Check urine sodium, urine creatinine, urine eosinophils Check renal ultrasound Hydrate with NS iv Check cmp in am  Hypokalemia Replete Check cmp in am  Anemia/ Thrombocytopenia Cbc in am Consider DIC panel and stopping lovenox if Thrombocytopenia worsens  Anxiety Cont clonazepam 1mg  po tid Cont lexapro  ADD Cont adderall     DVT Prophylaxis  Lovenox - SCDs  AM Labs Ordered, also please review Full Orders  Family Communication: Admission, patients condition and plan of care including tests being ordered have been discussed with the patient  who indicate understanding and agree with the plan and Code Status.  Code Status FULL CODE  Likely DC to  home  Condition GUARDED    Consults called: none  Admission status: inpatient   Time spent in minutes : 45   Pearson Grippe M.D on 08/22/2017 at 3:14 PM  Between 7am to 7pm - Pager - [phone]  . After 7pm go to [email or web address] - password [facility]  [facility] - Office  [phone]

## 2017-08-22 NOTE — Progress Notes (Signed)
CRITICAL VALUE ALERT  Critical Value:  Troponin 0.19  Date & Time Notied:  08/22/17 2230  Provider Notified: Kirtland BouchardK Schorr  Orders Received/Actions taken:

## 2017-08-22 NOTE — Progress Notes (Signed)
pt states does not want Lovenox injection this evening, education provided on rationale for use, states will think about it and let nursing know if she wants to take it. Earnstine RegalAshley Brandon Wiechman, RN

## 2017-08-22 NOTE — ED Notes (Signed)
Patient requesting something for pain.

## 2017-08-22 NOTE — ED Provider Notes (Addendum)
[facility] EMERGENCY DEPARTMENT Provider Note   CSN: [id number] Arrival date & time: 08/22/17  1114     History   Chief Complaint Chief Complaint  Patient presents with  . Hypotension  . Flank Pain    HPI Brooke Espinoza is a 32 y.o. female.  Level 5 caveat for urgent need for intervention.  [facility]  Patient reports bilateral flank pain for 2 days with associated dysuria and fever.  On initial examination the patient was lethargic and pale, but really she responded to intravenous fluids.  Past medical history includes obesity, depression, sleep apnea, asthma, gestational diabetes      Past Medical History:  Diagnosis Date  . Asthma    as a child  . Depression   . Gestational diabetes   . Recurrent UTI   . Renal disorder   . Sleep apnea     Patient Active Problem List   Diagnosis Date Noted  . Sleep apnea 11/13/2014  . MDD (major depressive disorder), recurrent episode, severe (HCC) 10/14/2014  . 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 Irregular menses 10/06/2014  . Sterilization 01/01/2013    Past Surgical History:  Procedure Laterality Date  . ELBOW SURGERY Left   . LAPAROSCOPIC BILATERAL SALPINGECTOMY Bilateral 01/01/2013   Procedure: LAPAROSCOPIC BILATERAL SALPINGECTOMY;  Surgeon: Tilda BurrowJohn V Ferguson, MD;  Location: [facility];  Service: Gynecology;  Laterality: Bilateral;  . TUBAL LIGATION      OB History    Gravida Para Term Preterm AB Living   4 3 3  0 1 3   SAB TAB Ectopic Multiple Live Births   0 0 1 0 3       Home Medications    Prior to Admission medications   Medication Sig Start Date End Date Taking? Authorizing Provider  acetaminophen (TYLENOL) 500 MG tablet Take 1,000 mg by mouth every 6 (six) hours as needed.   Yes [provider]  amphetamine-dextroamphetamine (ADDERALL) 20 MG tablet Take 1 tablet by mouth 2 (two) times daily. 11/06/15 08/22/17 Yes [provider]  clonazePAM  (KLONOPIN) 1 MG tablet Take 1 mg by mouth 3 (three) times daily.   Yes [provider]  escitalopram (LEXAPRO) 20 MG tablet Take 20 mg by mouth daily.   Yes [provider]  Naproxen Sodium ([employer] NAPROXEN SODIUM) 220 MG CAPS Take 220 mg by mouth daily as needed (for pain).   Yes [provider]    Family History Family History  Problem Relation Age of Onset  . Hypertension Mother   . Mental illness Mother        no specific diagnosis referred to as a chemical imbalance  . Depression Mother   . Hypertension Father   . Thyroid disease Father   . Heart disease Father   . Depression Father   . Anxiety disorder Father   . Heart disease Maternal Grandmother   . Diabetes Maternal Grandmother   . Hypertension Maternal Grandmother   . Dementia Maternal Grandfather     Social History Social History   Tobacco Use  . Smoking status: Current Every Day Smoker    Packs/day: 1.00    Types: Cigarettes    Last attempt to quit: 04/01/2015    Years since quitting: 2.3  . Smokeless tobacco: Never Used  Substance Use Topics  . Alcohol use: Yes    Alcohol/week: 0.6 oz    Types: 1 Glasses of wine per week  . Drug use: No  Allergies   Patient has no known allergies.   Review of Systems Review of Systems  All other systems reviewed and are negative.    Physical Exam Updated Vital Signs BP (!) 88/57   Pulse 98   Temp 98.3 F (36.8 C) (Oral)   Resp 20   Ht 5\' 7"  (1.702 m)   Wt 81.6 kg (180 lb)   LMP 08/06/2017   SpO2 100%   BMI 28.19 kg/m   Physical Exam  Constitutional:  Pale, febrile, alert  HENT:  Head: Normocephalic and atraumatic.  Eyes: Conjunctivae are normal.  Neck: Neck supple.  Cardiovascular: Normal rate and regular rhythm.  Pulmonary/Chest: Effort normal and breath sounds normal.  Abdominal: Soft. Bowel sounds are normal.  Genitourinary:  Genitourinary Comments: Bilateral flank tenderness.  Musculoskeletal: Normal range of  motion.  Neurological: She is alert.  Skin: Skin is warm and dry.  Psychiatric: She has a normal mood and affect. Her behavior is normal.  Nursing note and vitals reviewed.    ED Treatments / Results  Labs (all labs ordered are listed, but only abnormal results are displayed) Labs Reviewed  CBC WITH DIFFERENTIAL/PLATELET - Abnormal; Notable for the following components:      Result Value   RBC 3.80 (*)    Hemoglobin 11.0 (*)    HCT 33.9 (*)    Platelets 92 (*)    All other components within normal limits  COMPREHENSIVE METABOLIC PANEL - Abnormal; Notable for the following components:   Potassium 3.0 (*)    Glucose, Bld 141 (*)    BUN 25 (*)    Creatinine, Ser 1.70 (*)    Calcium 7.8 (*)    Total Protein 5.2 (*)    Albumin 2.3 (*)    GFR calc non Af Amer 39 (*)    GFR calc Af Amer 46 (*)    All other components within normal limits  URINALYSIS, ROUTINE W REFLEX MICROSCOPIC - Abnormal; Notable for the following components:   Color, Urine AMBER (*)    APPearance TURBID (*)    Hgb urine dipstick MODERATE (*)    Protein, ur 100 (*)    Nitrite POSITIVE (*)    Leukocytes, UA MODERATE (*)    Bacteria, UA RARE (*)    Squamous Epithelial / LPF TOO NUMEROUS TO COUNT (*)    Non Squamous Epithelial 0-5 (*)    All other components within normal limits  CBG MONITORING, ED - Abnormal; Notable for the following components:   Glucose-Capillary 126 (*)    All other components within normal limits  URINE CULTURE  LIPASE, BLOOD  POC URINE PREG, ED    EKG  EKG Interpretation  Date/Time:  Tuesday August 22 2017 11:28:28 EST Ventricular Rate:  97 PR Interval:    QRS Duration: 96 QT Interval:  327 QTC Calculation: 416 R Axis:   62 Text Interpretation:  Age not entered, assumed to be  32 years old for purpose of ECG interpretation Sinus rhythm Short PR interval Borderline T wave abnormalities Confirmed by Donnetta Hutching ([id number]) on 08/22/2017 1:03:50 PM       Radiology Dg Chest  Port 1 View  Result Date: 08/22/2017 CLINICAL DATA:  Hypotension. EXAM: PORTABLE CHEST 1 VIEW COMPARISON:  Chest x-ray dated September 03, 2014. FINDINGS: The heart size and mediastinal contours are within normal limits. Normal pulmonary vascularity. Subsegmental atelectasis at the left lung base. No focal consolidation, pleural effusion, or pneumothorax. No acute osseous abnormality. IMPRESSION: Left basilar subsegmental atelectasis.  No active disease. Electronically Signed   By: Obie Dredge M.D.   On: 08/22/2017 11:44    Procedures Procedures (including critical care time)  Medications Ordered in ED Medications  sodium chloride 0.9 % bolus 1,000 mL (0 mLs Intravenous Stopped 08/22/17 1316)  sodium chloride 0.9 % bolus 1,000 mL (0 mLs Intravenous Stopped 08/22/17 1316)  cefTRIAXone (ROCEPHIN) 2 g in sodium chloride 0.9 % 100 mL IVPB (0 g Intravenous Stopped 08/22/17 1313)  ketorolac (TORADOL) 30 MG/ML injection 30 mg (30 mg Intravenous Given 08/22/17 1221)  sodium chloride 0.9 % bolus 1,000 mL (0 mLs Intravenous Stopped 08/22/17 1438)  0.9 %  sodium chloride infusion ( Intravenous New Bag/Given 08/22/17 1442)     Initial Impression / Assessment and Plan / ED Course  I have reviewed the triage vital signs and the nursing notes.  Pertinent labs & imaging results that were available during my care of the patient were reviewed by me and considered in my medical decision making (see chart for details).     Patient presents with bilateral flank pain, fever, lethargy, hypotension.  She has improved dramatically with IV fluids.  Urinalysis shows evidence of infection.  I suspect she has an aggressive pyelonephritis.  She was given a minimum of 4 L of normal saline in the emergency department.  Rocephin 2 g IV.  Urine culture.  Admit to general medicine.   CRITICAL CARE Performed by: Donnetta Hutching Total critical care time: 35 minutes Critical care time was exclusive of separately billable  procedures and treating other patients. Critical care was necessary to treat or prevent imminent or life-threatening deterioration. Critical care was time spent personally by me on the following activities: development of treatment plan with patient and/or surrogate as well as nursing, discussions with consultants, evaluation of patient's response to treatment, examination of patient, obtaining history from patient or surrogate, ordering and performing treatments and interventions, ordering and review of laboratory studies, ordering and review of radiographic studies, pulse oximetry and re-evaluation of patient's condition.  Final Clinical Impressions(s) / ED Diagnoses   Final diagnoses:  Pyelonephritis    ED Discharge Orders    None       Donnetta Hutching, MD 08/22/17 1449    Donnetta Hutching, MD 08/22/17 [phone]

## 2017-08-22 NOTE — ED Notes (Signed)
Pt's significant other was informed that we need a urine sample from the Pt because Pt was asleep.

## 2017-08-22 NOTE — ED Notes (Signed)
Dr. Adriana Simasook made aware about patient.

## 2017-08-22 NOTE — ED Notes (Signed)
Gave EKG to Dr. Cook  

## 2017-08-22 NOTE — ED Triage Notes (Signed)
[facility]. Per EMS patient was letharic/ hypotensive (60/42). Patient complains of bilateral flank pain/right side is worse since Sunday. PAtient reports of fevers. EMS gave 1500ML of NS, pressure now 78/54. Patient is A&Ox4.

## 2017-08-22 NOTE — ED Notes (Signed)
Patient placed in [place] position.

## 2017-08-23 ENCOUNTER — Inpatient Hospital Stay (HOSPITAL_COMMUNITY): Payer: BLUE CROSS/BLUE SHIELD

## 2017-08-23 ENCOUNTER — Encounter (HOSPITAL_COMMUNITY): Payer: Self-pay | Admitting: Family Medicine

## 2017-08-23 DIAGNOSIS — N12 Tubulo-interstitial nephritis, not specified as acute or chronic: Secondary | ICD-10-CM

## 2017-08-23 DIAGNOSIS — I959 Hypotension, unspecified: Secondary | ICD-10-CM

## 2017-08-23 DIAGNOSIS — N179 Acute kidney failure, unspecified: Secondary | ICD-10-CM

## 2017-08-23 DIAGNOSIS — I361 Nonrheumatic tricuspid (valve) insufficiency: Secondary | ICD-10-CM

## 2017-08-23 LAB — CBC
HCT: 31.8 % — ABNORMAL LOW (ref 36.0–46.0)
Hemoglobin: 10.4 g/dL — ABNORMAL LOW (ref 12.0–15.0)
MCH: 28.8 pg (ref 26.0–34.0)
MCHC: 32.7 g/dL (ref 30.0–36.0)
MCV: 88.1 fL (ref 78.0–100.0)
PLATELETS: 123 10*3/uL — AB (ref 150–400)
RBC: 3.61 MIL/uL — AB (ref 3.87–5.11)
RDW: 14.6 % (ref 11.5–15.5)
WBC: 6.3 10*3/uL (ref 4.0–10.5)

## 2017-08-23 LAB — COMPREHENSIVE METABOLIC PANEL
ALT: 23 U/L (ref 14–54)
AST: 25 U/L (ref 15–41)
Albumin: 2.1 g/dL — ABNORMAL LOW (ref 3.5–5.0)
Alkaline Phosphatase: 98 U/L (ref 38–126)
Anion gap: 7 (ref 5–15)
BILIRUBIN TOTAL: 0.5 mg/dL (ref 0.3–1.2)
BUN: 27 mg/dL — AB (ref 6–20)
CO2: 20 mmol/L — ABNORMAL LOW (ref 22–32)
CREATININE: 1.47 mg/dL — AB (ref 0.44–1.00)
Calcium: 7.1 mg/dL — ABNORMAL LOW (ref 8.9–10.3)
Chloride: 113 mmol/L — ABNORMAL HIGH (ref 101–111)
GFR, EST AFRICAN AMERICAN: 54 mL/min — AB (ref >60)
GFR, EST NON AFRICAN AMERICAN: 47 mL/min — AB (ref >60)
Glucose, Bld: 85 mg/dL (ref 65–99)
Potassium: 3.6 mmol/L (ref 3.5–5.1)
Sodium: 140 mmol/L (ref 135–145)
TOTAL PROTEIN: 5 g/dL — AB (ref 6.5–8.1)

## 2017-08-23 LAB — ECHOCARDIOGRAM COMPLETE
Height: 67 in
Weight: 3474.45 oz

## 2017-08-23 LAB — SODIUM, URINE, RANDOM: SODIUM UR: 15 mmol/L

## 2017-08-23 LAB — TROPONIN I
TROPONIN I: 0.18 ng/mL — AB (ref <0.03)
TROPONIN I: 0.19 ng/mL — AB (ref <0.03)

## 2017-08-23 MED ORDER — SENNOSIDES-DOCUSATE SODIUM 8.6-50 MG PO TABS
1.0000 | ORAL_TABLET | Freq: Two times a day (BID) | ORAL | Status: DC
  Administered 2017-08-23 – 2017-08-24 (×2): 1 via ORAL
  Filled 2017-08-23 (×2): qty 1

## 2017-08-23 MED ORDER — GUAIFENESIN-DM 100-10 MG/5ML PO SYRP
5.0000 mL | ORAL_SOLUTION | ORAL | Status: DC | PRN
  Administered 2017-08-23 – 2017-08-24 (×3): 5 mL via ORAL
  Filled 2017-08-23 (×4): qty 5

## 2017-08-23 MED ORDER — AMPHETAMINE-DEXTROAMPHETAMINE 10 MG PO TABS
20.0000 mg | ORAL_TABLET | Freq: Once | ORAL | Status: DC
  Filled 2017-08-23: qty 2

## 2017-08-23 MED ORDER — ENOXAPARIN SODIUM 40 MG/0.4ML ~~LOC~~ SOLN
40.0000 mg | SUBCUTANEOUS | Status: DC
  Filled 2017-08-23: qty 0.4

## 2017-08-23 MED ORDER — NICOTINE 21 MG/24HR TD PT24
21.0000 mg | MEDICATED_PATCH | Freq: Every day | TRANSDERMAL | Status: DC
  Administered 2017-08-23 – 2017-08-24 (×2): 21 mg via TRANSDERMAL
  Filled 2017-08-23 (×2): qty 1

## 2017-08-23 MED ORDER — HYDROCODONE-ACETAMINOPHEN 5-325 MG PO TABS
1.0000 | ORAL_TABLET | Freq: Four times a day (QID) | ORAL | Status: DC | PRN
  Administered 2017-08-23 – 2017-08-24 (×3): 2 via ORAL
  Administered 2017-08-24: 1 via ORAL
  Filled 2017-08-23 (×2): qty 2
  Filled 2017-08-23: qty 1
  Filled 2017-08-23: qty 2

## 2017-08-23 MED ORDER — ONDANSETRON HCL 4 MG/2ML IJ SOLN
4.0000 mg | Freq: Four times a day (QID) | INTRAMUSCULAR | Status: DC | PRN
  Administered 2017-08-23: 4 mg via INTRAVENOUS
  Filled 2017-08-23: qty 2

## 2017-08-23 NOTE — Progress Notes (Signed)
Troponin of 0.18 noted.  Down from prior 0.19.  Notified MD via text page.

## 2017-08-23 NOTE — Progress Notes (Signed)
Pt requested a new nurse, am now taking over care of the patient. Stated she wanted to go outside, will check with MD. Will continue to monitor.

## 2017-08-23 NOTE — Progress Notes (Signed)
PROGRESS NOTE    Brooke LarsenKathryn J Espinoza  ZOX:[id number]  DOB: 1986-02-10  DOA: 08/22/2017 PCP: Rebecka ApleyHemberg, Brooke V, NP   Brief Admission Hx: 32 year old female with OSA, asthma, chronic nicotine dependence, presented to the ED complaining of right sided flank pain and weakness.  She was febrile and septic and admitted for pyelonephritis.  MDM/Assessment & Plan:   1. Sepsis secondary to E coli UTI-clinically improving with supportive care which we will continue. 2. Hypotension much improved with IV fluid hydration and supportive care. 3. Right kidney pyelonephritis-continue IV antibiotics, ceftriaxone 2 g every 24 hours.  Follow urine culture and sensitivity results. 4. ARF -suspect mostly prerenal and improving with IV fluid hydration continue to monitor. 5. Hypokalemia-repleted. 6. Generalized anxiety disorder-continue clonazepam and Lexapro as ordered. 7. Adult ADD-continue Adderall as ordered. 8. Anemia/thrombocytopenia-we will continue to follow.  Has remained stable. 9. Elevated troponin-suspect this is demand ischemia as she has no symptoms of ACS.  No chest pain symptoms.  No shortness of breath.  Do not feel that this is ACS.  DVT prophylaxis: SCDs Code Status: Full Family Communication: Husband at bedside Disposition Plan: Home    Subjective: Patient reports that she feels that her skin is puffy but otherwise still complains of flank pain on the right side and asking for more pain medication.  She did have a fever last night.  Objective: Vitals:   08/22/17 2016 08/22/17 2300 08/23/17 0100 08/23/17 0430  BP: 108/62   (!) 106/55  Pulse: (!) 112   99  Resp: 18     Temp: (!) 102.8 F (39.3 C) (!) 101.2 F (38.4 C) 99.4 F (37.4 C) 99.5 F (37.5 C)  TempSrc: Oral   Oral  SpO2: 100%   98%  Weight:    98.5 kg (217 lb 2.5 oz)  Height:        Intake/Output Summary (Last 24 hours) at 08/23/2017 1040 Last data filed at 08/23/2017 0818 Gross per 24 hour  Intake 5265 ml    Output 200 ml  Net 5065 ml   Filed Weights   08/22/17 1120 08/23/17 0430  Weight: 81.6 kg (180 lb) 98.5 kg (217 lb 2.5 oz)     REVIEW OF SYSTEMS  As per history otherwise all reviewed and reported negative  Exam:  General exam: Awake, alert, no distress, pleasant. Respiratory system: Clear. No increased work of breathing. Cardiovascular system: S1 & S2 heard, RRR. No JVD, murmurs, gallops, clicks or pedal edema. Gastrointestinal system: Abdomen is nondistended, soft and nontender. Normal bowel sounds heard.  Right flank pain. Central nervous system: Alert and oriented. No focal neurological deficits. Extremities: Trace pretibial edema bilateral lower extremities.  Data Reviewed: Basic Metabolic Panel: Recent Labs  Lab 08/20/17 1521 08/22/17 1125 08/23/17 0352  NA 140 138 140  K 3.2* 3.0* 3.6  CL 104 106 113*  CO2 26 23 20*  GLUCOSE 114* 141* 85  BUN 12 25* 27*  CREATININE 0.78 1.70* 1.47*  CALCIUM 8.9 7.8* 7.1*   Liver Function Tests: Recent Labs  Lab 08/22/17 1125 08/23/17 0352  AST 21 25  ALT 18 23  ALKPHOS 81 98  BILITOT 0.5 0.5  PROT 5.2* 5.0*  ALBUMIN 2.3* 2.1*   Recent Labs  Lab 08/22/17 1125  LIPASE 18   No results for input(s): AMMONIA in the last 168 hours. CBC: Recent Labs  Lab 08/20/17 1521 08/22/17 1125 08/23/17 0352  WBC 13.6* 9.6 6.3  NEUTROABS  --  8.8  --   HGB 13.7  11.0* 10.4*  HCT 42.6 33.9* 31.8*  MCV 89.1 89.2 88.1  PLT 162 92* 123*   Cardiac Enzymes: Recent Labs  Lab 08/22/17 2101 08/23/17 0352 08/23/17 0926  TROPONINI 0.19* 0.18* 0.19*   CBG (last 3)  Recent Labs    08/20/17 1421 08/22/17 1142  GLUCAP 139* 126*   Recent Results (from the past 240 hour(s))  Urine C&S     Status: Abnormal   Collection Time: 08/20/17  3:56 PM  Result Value Ref Range Status   Specimen Description   Final    URINE, CLEAN CATCH Performed at [facility], [address] [place], [place] [address]    Special Requests   Final     NONE Performed at [facility], [address], [place], [place] [address]    Culture >=100,000 COLONIES/mL ESCHERICHIA COLI (A)  Final   Report Status 08/22/2017 FINAL  Final   Organism ID, Bacteria ESCHERICHIA COLI (A)  Final      Susceptibility   Escherichia coli - MIC*    AMPICILLIN >=32 RESISTANT Resistant     CEFAZOLIN <=4 SENSITIVE Sensitive     CEFTRIAXONE <=1 SENSITIVE Sensitive     CIPROFLOXACIN <=0.25 SENSITIVE Sensitive     GENTAMICIN <=1 SENSITIVE Sensitive     IMIPENEM <=0.25 SENSITIVE Sensitive     NITROFURANTOIN <=16 SENSITIVE Sensitive     TRIMETH/SULFA >=320 RESISTANT Resistant     AMPICILLIN/SULBACTAM 16 INTERMEDIATE Intermediate     PIP/TAZO <=4 SENSITIVE Sensitive     Extended ESBL NEGATIVE Sensitive     * >=100,000 COLONIES/mL ESCHERICHIA COLI  Culture, blood (routine x 2)     Status: None (Preliminary result)   Collection Time: 08/22/17  8:56 PM  Result Value Ref Range Status   Specimen Description BLOOD LEFT ARM  Final   Special Requests   Final    BOTTLES DRAWN AEROBIC AND ANAEROBIC Blood Culture adequate volume   Culture   Final    NO GROWTH < 12 HOURS Performed at [facility], [address], [place], [place] [address]    Report Status PENDING  Incomplete  Culture, blood (routine x 2)     Status: None (Preliminary result)   Collection Time: 08/22/17  9:01 PM  Result Value Ref Range Status   Specimen Description BLOOD LEFT HAND  Final   Special Requests   Final    BOTTLES DRAWN AEROBIC AND ANAEROBIC Blood Culture adequate volume   Culture   Final    NO GROWTH < 12 HOURS Performed at [facility], [address] [place], [place] [address]    Report Status PENDING  Incomplete    Studies: US Renal  Result Date: 08/23/2017 CLINICAL DATA:  Pyelonephritis EXAM: RENAL / URINARY TRACT ULTRASOUND COMPLETE COMPARISON:  None. FINDINGS: Right Kidney: Length: 14.3 cm. Echogenicity and renal cortical thickness are within normal limits. No hydronephrosis  hydronephrosis visualized. There is no well-defined mass. However, there is increased echogenicity along the periphery of the upper pole the right kidney, an appearance suggesting acute lobar nephronia/bacterial nephritis in this region on the right. This area of abnormal echogenicity measures approximately 3.8 x 2.2 cm. There is no frank abscess seen. Minimal perinephric fluid is seen near this area of apparent acute lobar nephronia. Left Kidney: Length: 14.1 cm. Echogenicity and renal cortical thickness are within normal limits. No mass, perinephric fluid, or hydronephrosis visualized. No sonographically demonstrable calculus or ureterectasis. Bladder: Appears normal for degree of bladder distention. IMPRESSION: Focal increase in echogenicity along the upper  pole of the right kidney peripherally, an appearance felt to be indicative of acute lobar nephronia/bacterial nephritis. No well-defined mass or abscess. Minimal perinephric fluid in the area of apparent inflammation on the right. Study otherwise unremarkable. These results will be called to the ordering clinician or representative by the Radiologist Assistant, and communication documented in the PACS or zVision Dashboard. Electronically Signed   By: Bretta Bang III M.D.   On: 08/23/2017 10:15   Dg Chest Port 1 View  Result Date: 08/22/2017 CLINICAL DATA:  Hypotension. EXAM: PORTABLE CHEST 1 VIEW COMPARISON:  Chest x-ray dated September 03, 2014. FINDINGS: The heart size and mediastinal contours are within normal limits. Normal pulmonary vascularity. Subsegmental atelectasis at the left lung base. No focal consolidation, pleural effusion, or pneumothorax. No acute osseous abnormality. IMPRESSION: Left basilar subsegmental atelectasis.  No active disease. Electronically Signed   By: Obie Dredge M.D.   On: 08/22/2017 11:44   Scheduled Meds: . amphetamine-dextroamphetamine  20 mg Oral BID WC  . enoxaparin (LOVENOX) injection  30 mg Subcutaneous  Q24H  . escitalopram  20 mg Oral Daily  . feeding supplement (PRO-STAT SUGAR FREE 64)  30 mL Oral BID   Continuous Infusions: . sodium chloride 150 mL/hr at 08/23/17 0102  . cefTRIAXone (ROCEPHIN)  IV      Active Problems:   Pyelonephritis   Hypotension   ARF (acute renal failure) (HCC)  Time spent:   Standley Dakins, MD, FAAFP [facility] Pager [phone]  If 7PM-7AM, please contact night-coverage [email or web address] Password [email or web address] 08/23/2017, 10:40 AM    LOS: 1 day

## 2017-08-23 NOTE — Progress Notes (Signed)
*  PRELIMINARY RESULTS* Echocardiogram 2D Echocardiogram has been performed.  Stacey DrainWhite, Braidon Chermak J 08/23/2017, 2:52 PM

## 2017-08-23 NOTE — Progress Notes (Addendum)
Critical troponin called, 0.18.  Had been 0.19.  Texted Dr. Laural BenesJohnson

## 2017-08-24 LAB — CALCIUM / CREATININE RATIO, URINE
Calcium, Ur: <0.8 mg/dL
Calcium/Creat.Ratio: <8 mg/g creat (ref 0–260)
Creatinine, Urine: 94.8 mg/dL

## 2017-08-24 LAB — CBC WITH DIFFERENTIAL/PLATELET
Basophils Absolute: 0 10*3/uL (ref 0.0–0.1)
Basophils Relative: 0 %
EOS PCT: 0 %
Eosinophils Absolute: 0 10*3/uL (ref 0.0–0.7)
HCT: 32.9 % — ABNORMAL LOW (ref 36.0–46.0)
Hemoglobin: 10.8 g/dL — ABNORMAL LOW (ref 12.0–15.0)
LYMPHS ABS: 0.6 10*3/uL — AB (ref 0.7–4.0)
Lymphocytes Relative: 8 %
MCH: 28.7 pg (ref 26.0–34.0)
MCHC: 32.8 g/dL (ref 30.0–36.0)
MCV: 87.5 fL (ref 78.0–100.0)
MONO ABS: 0.5 10*3/uL (ref 0.1–1.0)
Monocytes Relative: 6 %
NEUTROS ABS: 6.6 10*3/uL (ref 1.7–7.7)
Neutrophils Relative %: 86 %
PLATELETS: 131 10*3/uL — AB (ref 150–400)
RBC: 3.76 MIL/uL — AB (ref 3.87–5.11)
RDW: 15.4 % (ref 11.5–15.5)
WBC: 7.7 10*3/uL (ref 4.0–10.5)

## 2017-08-24 LAB — COMPREHENSIVE METABOLIC PANEL
ALT: 27 U/L (ref 14–54)
AST: 26 U/L (ref 15–41)
Albumin: 2.1 g/dL — ABNORMAL LOW (ref 3.5–5.0)
Alkaline Phosphatase: 112 U/L (ref 38–126)
Anion gap: 8 (ref 5–15)
BILIRUBIN TOTAL: 0.7 mg/dL (ref 0.3–1.2)
BUN: 20 mg/dL (ref 6–20)
CO2: 20 mmol/L — ABNORMAL LOW (ref 22–32)
CREATININE: 0.96 mg/dL (ref 0.44–1.00)
Calcium: 7.4 mg/dL — ABNORMAL LOW (ref 8.9–10.3)
Chloride: 112 mmol/L — ABNORMAL HIGH (ref 101–111)
GFR calc Af Amer: >60 mL/min (ref >60)
GLUCOSE: 121 mg/dL — AB (ref 65–99)
Potassium: 4.1 mmol/L (ref 3.5–5.1)
Sodium: 140 mmol/L (ref 135–145)
TOTAL PROTEIN: 5.2 g/dL — AB (ref 6.5–8.1)

## 2017-08-24 LAB — HIV ANTIBODY (ROUTINE TESTING W REFLEX): HIV Screen 4th Generation wRfx: NONREACTIVE

## 2017-08-24 MED ORDER — CIPROFLOXACIN HCL 500 MG PO TABS: 500.0000 mg | ORAL_TABLET | Freq: Two times a day (BID) | ORAL | 0 refills | Status: AC

## 2017-08-24 MED ORDER — HYDROCODONE-ACETAMINOPHEN 5-325 MG PO TABS: 1.0000 | ORAL_TABLET | Freq: Four times a day (QID) | ORAL | 0 refills | Status: DC | PRN

## 2017-08-24 MED ORDER — BENZONATATE 100 MG PO CAPS: 100.0000 mg | ORAL_CAPSULE | Freq: Three times a day (TID) | ORAL | 0 refills | Status: DC | PRN

## 2017-08-24 NOTE — Discharge Instructions (Signed)
Follow with Primary MD  Ihor Austin, Ruby Cola, NP  and other consultant's as instructed your Hospitalist MD  Please get a complete blood count and chemistry panel checked by your Primary MD at your next visit, and again as instructed by your Primary MD.  Get Medicines reviewed and adjusted: Please take all your medications with you for your next visit with your Primary MD  Laboratory/radiological data: Please request your Primary MD to go over all hospital tests and procedure/radiological results at the follow up, please ask your Primary MD to get all Hospital records sent to his/her office.  In some cases, they will be blood work, cultures and biopsy results pending at the time of your discharge. Please request that your primary care M.D. follows up on these results.  Also Note the following: If you experience worsening of your admission symptoms, develop shortness of breath, life threatening emergency, suicidal or homicidal thoughts you must seek medical attention immediately by calling 911 or calling your MD immediately  if symptoms less severe.  You must read complete instructions/literature along with all the possible adverse reactions/side effects for all the Medicines you take and that have been prescribed to you. Take any new Medicines after you have completely understood and accpet all the possible adverse reactions/side effects.   Do not drive when taking Pain medications or sleeping medications (Benzodaizepines)  Do not take more than prescribed Pain, Sleep and Anxiety Medications. It is not advisable to combine anxiety,sleep and pain medications without talking with your primary care practitioner  Special Instructions: If you have smoked or chewed Tobacco  in the last 2 yrs please stop smoking, stop any regular Alcohol  and or any Recreational drug use.  Wear Seat belts while driving.  Please note: You were cared for by a hospitalist during your hospital stay. Once you are  discharged, your primary care physician will handle any further medical issues. Please note that NO REFILLS for any discharge medications will be authorized once you are discharged, as it is imperative that you return to your primary care physician (or establish a relationship with a primary care physician if you do not have one) for your post hospital discharge needs so that they can reassess your need for medications and monitor your lab values.    Pyelonephritis, Adult Pyelonephritis is a kidney infection. The kidneys are organs that help clean your blood by moving waste out of your blood and into your pee (urine). This infection can happen quickly, or it can last for a long time. In most cases, it clears up with treatment and does not cause other problems. Follow these instructions at home: Medicines  Take over-the-counter and prescription medicines only as told by your doctor.  Take your antibiotic medicine as told by your doctor. Do not stop taking the medicine even if you start to feel better. General instructions  Drink enough fluid to keep your pee clear or pale yellow.  Avoid caffeine, tea, and carbonated drinks.  Pee (urinate) often. Avoid holding in pee for long periods of time.  Pee before and after sex.  After pooping (having a bowel movement), women should wipe from front to back. Use each tissue only once.  Keep all follow-up visits as told by your doctor. This is important. Contact a doctor if:  You do not feel better after 2 days.  Your symptoms get worse.  You have a fever. Get help right away if:  You cannot take your medicine or drink fluids as told.  You have chills and shaking.  You throw up (vomit).  You have very bad pain in your side (flank) or back.  You feel very weak or you pass out (faint). This information is not intended to replace advice given to you by your health care provider. Make sure you discuss any questions you have with your health  care provider. Document Released: 08/04/2004 Document Revised: 12/03/2015 Document Reviewed: 10/20/2014 Elsevier Interactive Patient Education  [employer]2018 [employer]

## 2017-08-24 NOTE — Discharge Summary (Signed)
Physician Discharge Summary  Brooke Espinoza:[id number] DOB: 11-Jun-1986 DOA: 08/22/2017  PCP: Rebecka Apley, NP  Admit date: 08/22/2017 Discharge date: 08/24/2017  Admitted From: Home Disposition: Home Recommendations for Outpatient Follow-up:  1. Follow up with PCP in 1 weeks 2. Please obtain BMP/CBC in one week 3. Please follow up on the following pending results: Final culture data  Discharge Condition: Stable CODE STATUS: Full  Brief Hospitalization Summary: Please see all hospital notes, images, labs for full details of the hospitalization.  Brief Admission Hx: 32 year old female with OSA, asthma, chronic nicotine dependence, presented to the ED complaining of right sided flank pain and weakness.  She was febrile and septic and admitted for pyelonephritis.  MDM/Assessment & Plan:   1. Sepsis secondary to E coli UTI-clinically improving with supportive care which we will continue. 2. Hypotension much improved with IV fluid hydration and supportive care. 3. Right kidney pyelonephritis-treated with IV antibiotics, ceftriaxone 2 g every 24 hours.  Follow urine culture and sensitivity results. E coli positive and sensitive to ciprofloxacin.  Discharged on 12 more days oral ciprofloxacin for total of 14 days. 4. ARF -resolved with IV fluid hydration.  Suspect mostly prerenal. 5. Hypokalemia-repleted. 6. Generalized anxiety disorder-continue clonazepam and Lexapro as ordered. 7. Adult ADD-continue Adderall as ordered. 8. Anemia/thrombocytopenia-we will continue to follow.  Has remained stable. 9. Elevated troponin-suspect this is demand ischemia as she has no symptoms of ACS.  No chest pain symptoms.  No shortness of breath.    DVT prophylaxis: SCDs Code Status: Full Family Communication: Husband at bedside Disposition Plan: Home  Discharge Diagnoses:  Active Problems:   Pyelonephritis   Hypotension   ARF (acute renal failure) [facility])  Discharge  Instructions: Discharge Instructions    Call MD for:  difficulty breathing, headache or visual disturbances   Complete by:  As directed    Call MD for:  extreme fatigue   Complete by:  As directed    Call MD for:  persistant dizziness or light-headedness   Complete by:  As directed    Call MD for:  severe uncontrolled pain   Complete by:  As directed    Increase activity slowly   Complete by:  As directed      Allergies as of 08/24/2017   No Known Allergies     Medication List    STOP taking these medications   [employer] NAPROXEN SODIUM 220 MG Caps Generic drug:  Naproxen Sodium     TAKE these medications   acetaminophen 500 MG tablet Commonly known as:  TYLENOL Take 1,000 mg by mouth every 6 (six) hours as needed.   ADDERALL 20 MG tablet Generic drug:  amphetamine-dextroamphetamine Take 1 tablet by mouth 2 (two) times daily.   benzonatate 100 MG capsule Commonly known as:  TESSALON Take 1-2 capsules (100-200 mg total) by mouth 3 (three) times daily as needed for cough.   ciprofloxacin 500 MG tablet Commonly known as:  CIPRO Take 1 tablet (500 mg total) by mouth 2 (two) times daily for 12 days.   clonazePAM 1 MG tablet Commonly known as:  KLONOPIN Take 1 mg by mouth 3 (three) times daily.   escitalopram 20 MG tablet Commonly known as:  LEXAPRO Take 20 mg by mouth daily.   HYDROcodone-acetaminophen 5-325 MG tablet Commonly known as:  NORCO/VICODIN Take 1 tablet by mouth every 6 (six) hours as needed for severe pain.      Follow-up Information    Hemberg, Ruby Cola, NP. Schedule an  appointment as soon as possible for a visit in 1 week(s).   Specialty:  Adult Health Nurse Practitioner Why:  Hospital follow-up Contact information: [address] [place] [address] [phone]          No Known Allergies Allergies as of 08/24/2017   No Known Allergies     Medication List    STOP taking these medications   [employer] NAPROXEN SODIUM 220 MG  Caps Generic drug:  Naproxen Sodium     TAKE these medications   acetaminophen 500 MG tablet Commonly known as:  TYLENOL Take 1,000 mg by mouth every 6 (six) hours as needed.   ADDERALL 20 MG tablet Generic drug:  amphetamine-dextroamphetamine Take 1 tablet by mouth 2 (two) times daily.   benzonatate 100 MG capsule Commonly known as:  TESSALON Take 1-2 capsules (100-200 mg total) by mouth 3 (three) times daily as needed for cough.   ciprofloxacin 500 MG tablet Commonly known as:  CIPRO Take 1 tablet (500 mg total) by mouth 2 (two) times daily for 12 days.   clonazePAM 1 MG tablet Commonly known as:  KLONOPIN Take 1 mg by mouth 3 (three) times daily.   escitalopram 20 MG tablet Commonly known as:  LEXAPRO Take 20 mg by mouth daily.   HYDROcodone-acetaminophen 5-325 MG tablet Commonly known as:  NORCO/VICODIN Take 1 tablet by mouth every 6 (six) hours as needed for severe pain.      Procedures/Studies: US Renal  Result Date: 08/23/2017 CLINICAL DATA:  Pyelonephritis EXAM: RENAL / URINARY TRACT ULTRASOUND COMPLETE COMPARISON:  None. FINDINGS: Right Kidney: Length: 14.3 cm. Echogenicity and renal cortical thickness are within normal limits. No hydronephrosis hydronephrosis visualized. There is no well-defined mass. However, there is increased echogenicity along the periphery of the upper pole the right kidney, an appearance suggesting acute lobar nephronia/bacterial nephritis in this region on the right. This area of abnormal echogenicity measures approximately 3.8 x 2.2 cm. There is no frank abscess seen. Minimal perinephric fluid is seen near this area of apparent acute lobar nephronia. Left Kidney: Length: 14.1 cm. Echogenicity and renal cortical thickness are within normal limits. No mass, perinephric fluid, or hydronephrosis visualized. No sonographically demonstrable calculus or ureterectasis. Bladder: Appears normal for degree of bladder distention. IMPRESSION: Focal increase  in echogenicity along the upper pole of the right kidney peripherally, an appearance felt to be indicative of acute lobar nephronia/bacterial nephritis. No well-defined mass or abscess. Minimal perinephric fluid in the area of apparent inflammation on the right. Study otherwise unremarkable. These results will be called to the ordering clinician or representative by the Radiologist Assistant, and communication documented in the PACS or zVision Dashboard. Electronically Signed   By: Bretta Bang III M.D.   On: 08/23/2017 10:15   Dg Chest Port 1 View  Result Date: 08/22/2017 CLINICAL DATA:  Hypotension. EXAM: PORTABLE CHEST 1 VIEW COMPARISON:  Chest x-ray dated September 03, 2014. FINDINGS: The heart size and mediastinal contours are within normal limits. Normal pulmonary vascularity. Subsegmental atelectasis at the left lung base. No focal consolidation, pleural effusion, or pneumothorax. No acute osseous abnormality. IMPRESSION: Left basilar subsegmental atelectasis.  No active disease. Electronically Signed   By: Obie Dredge M.D.   On: 08/22/2017 11:44      Subjective: Patient says she is feeling much better.  She is not nauseated or vomiting.  She is eating and drinking well.  She is not having any chest pain or shortness of breath.  She would like to go home  today.  She says that she would take her antibiotics as prescribed.  Discharge Exam: Vitals:   08/23/17 2257 08/24/17 0645  BP: 118/75 117/67  Pulse: 98 91  Resp: 15 18  Temp: 98.3 F (36.8 C) 97.8 F (36.6 C)  SpO2: 95%    Vitals:   08/23/17 0430 08/23/17 2257 08/24/17 0500 08/24/17 0645  BP: (!) 106/55 118/75  117/67  Pulse: 99 98  91  Resp:  15  18  Temp: 99.5 F (37.5 C) 98.3 F (36.8 C)  97.8 F (36.6 C)  TempSrc: Oral Oral    SpO2: 98% 95%    Weight: 98.5 kg (217 lb 2.5 oz)  98.3 kg (216 lb 11.4 oz)   Height:       General: Pt is alert, awake, not in acute distress Cardiovascular: RRR, S1/S2 +, no rubs, no  gallops Respiratory: CTA bilaterally, no wheezing, no rhonchi Abdominal: Soft, NT, ND, bowel sounds + Extremities: no edema, no cyanosis   The results of significant diagnostics from this hospitalization (including imaging, microbiology, ancillary and laboratory) are listed below for reference.    Microbiology: Recent Results (from the past 240 hour(s))  Urine C&S     Status: Abnormal   Collection Time: 08/20/17  3:56 PM  Result Value Ref Range Status   Specimen Description   Final    URINE, CLEAN CATCH Performed at [facility], [address] [place], [place] [address]    Special Requests   Final    NONE Performed at [facility], [address] [place], [place] [address]    Culture >=100,000 COLONIES/mL ESCHERICHIA COLI (A)  Final   Report Status 08/22/2017 FINAL  Final   Organism ID, Bacteria ESCHERICHIA COLI (A)  Final      Susceptibility   Escherichia coli - MIC*    AMPICILLIN >=32 RESISTANT Resistant     CEFAZOLIN <=4 SENSITIVE Sensitive     CEFTRIAXONE <=1 SENSITIVE Sensitive     CIPROFLOXACIN <=0.25 SENSITIVE Sensitive     GENTAMICIN <=1 SENSITIVE Sensitive     IMIPENEM <=0.25 SENSITIVE Sensitive     NITROFURANTOIN <=16 SENSITIVE Sensitive     TRIMETH/SULFA >=320 RESISTANT Resistant     AMPICILLIN/SULBACTAM 16 INTERMEDIATE Intermediate     PIP/TAZO <=4 SENSITIVE Sensitive     Extended ESBL NEGATIVE Sensitive     * >=100,000 COLONIES/mL ESCHERICHIA COLI  Urine culture     Status: None (Preliminary result)   Collection Time: 08/22/17 11:26 AM  Result Value Ref Range Status   Specimen Description   Final    URINE, RANDOM Performed at [facility], [address] [place], [place] [address]    Special Requests   Final    NONE Performed at [facility], [address] [place], [place] [address]    Culture   Final    CULTURE REINCUBATED FOR BETTER GROWTH Performed at [facility], [address] [place], [place] [address]    Report Status PENDING   Incomplete  Culture, blood (routine x 2)     Status: None (Preliminary result)   Collection Time: 08/22/17  8:56 PM  Result Value Ref Range Status   Specimen Description BLOOD LEFT ARM  Final   Special Requests   Final    BOTTLES DRAWN AEROBIC AND ANAEROBIC Blood Culture adequate volume   Culture   Final    NO GROWTH 2 DAYS Performed at [facility], [address] [place], [place] [address]    Report Status PENDING  Incomplete  Culture, blood (routine x 2)     Status: None (Preliminary result)   Collection Time: 08/22/17  9:01 PM  Result Value Ref Range Status   Specimen Description BLOOD LEFT HAND  Final   Special Requests   Final    BOTTLES DRAWN AEROBIC AND ANAEROBIC Blood Culture adequate volume   Culture   Final    NO GROWTH 2 DAYS Performed at [facility], [address], [place], [place] [address]    Report Status PENDING  Incomplete     Labs: BNP (last 3 results) No results for input(s): BNP in the last 8760 hours. Basic Metabolic Panel: Recent Labs  Lab 08/20/17 1521 08/22/17 1125 08/23/17 0352 08/24/17 0348  NA 140 138 140 140  K 3.2* 3.0* 3.6 4.1  CL 104 106 113* 112*  CO2 26 23 20* 20*  GLUCOSE 114* 141* 85 121*  BUN 12 25* 27* 20  CREATININE 0.78 1.70* 1.47* 0.96  CALCIUM 8.9 7.8* 7.1* 7.4*   Liver Function Tests: Recent Labs  Lab 08/22/17 1125 08/23/17 0352 08/24/17 0348  AST 21 25 26   ALT 18 23 27   ALKPHOS 81 98 112  BILITOT 0.5 0.5 0.7  PROT 5.2* 5.0* 5.2*  ALBUMIN 2.3* 2.1* 2.1*   Recent Labs  Lab 08/22/17 1125  LIPASE 18   No results for input(s): AMMONIA in the last 168 hours. CBC: Recent Labs  Lab 08/20/17 1521 08/22/17 1125 08/23/17 0352 08/24/17 0348  WBC 13.6* 9.6 6.3 7.7  NEUTROABS  --  8.8  --  6.6  HGB 13.7 11.0* 10.4* 10.8*  HCT 42.6 33.9* 31.8* 32.9*  MCV 89.1 89.2 88.1 87.5  PLT 162 92* 123* 131*   Cardiac Enzymes: Recent Labs  Lab 08/22/17 2101 08/23/17 0352 08/23/17 0926  TROPONINI 0.19* 0.18* 0.19*    BNP: Invalid input(s): POCBNP CBG: Recent Labs  Lab 08/20/17 1421 08/22/17 1142  GLUCAP 139* 126*   D-Dimer No results for input(s): DDIMER in the last 72 hours. Hgb A1c No results for input(s): HGBA1C in the last 72 hours. Lipid Profile No results for input(s): CHOL, HDL, LDLCALC, TRIG, CHOLHDL, LDLDIRECT in the last 72 hours. Thyroid function studies No results for input(s): TSH, T4TOTAL, T3FREE, THYROIDAB in the last 72 hours.  Invalid input(s): FREET3 Anemia work up No results for input(s): VITAMINB12, FOLATE, FERRITIN, TIBC, IRON, RETICCTPCT in the last 72 hours. Urinalysis    Component Value Date/Time   COLORURINE AMBER (A) 08/22/2017 1126   APPEARANCEUR TURBID (A) 08/22/2017 1126   LABSPEC 1.014 08/22/2017 1126   PHURINE 5.0 08/22/2017 1126   GLUCOSEU NEGATIVE 08/22/2017 1126   HGBUR MODERATE (A) 08/22/2017 1126   BILIRUBINUR NEGATIVE 08/22/2017 1126   KETONESUR NEGATIVE 08/22/2017 1126   PROTEINUR 100 (A) 08/22/2017 1126   UROBILINOGEN 0.2 12/26/2012 1107   NITRITE POSITIVE (A) 08/22/2017 1126   LEUKOCYTESUR MODERATE (A) 08/22/2017 1126   Sepsis Labs Invalid input(s): PROCALCITONIN,  WBC,  LACTICIDVEN Microbiology Recent Results (from the past 240 hour(s))  Urine C&S     Status: Abnormal   Collection Time: 08/20/17  3:56 PM  Result Value Ref Range Status   Specimen Description   Final    URINE, CLEAN CATCH Performed at [facility], [address] [place], [place] [address]    Special Requests   Final    NONE Performed at [facility], [address] [place], [place] [address]    Culture >=100,000 COLONIES/mL ESCHERICHIA COLI (A)  Final   Report Status  08/22/2017 FINAL  Final   Organism ID, Bacteria ESCHERICHIA COLI (A)  Final      Susceptibility   Escherichia coli - MIC*    AMPICILLIN >=32 RESISTANT Resistant     CEFAZOLIN <=4 SENSITIVE Sensitive     CEFTRIAXONE <=1 SENSITIVE Sensitive     CIPROFLOXACIN <=0.25 SENSITIVE Sensitive      GENTAMICIN <=1 SENSITIVE Sensitive     IMIPENEM <=0.25 SENSITIVE Sensitive     NITROFURANTOIN <=16 SENSITIVE Sensitive     TRIMETH/SULFA >=320 RESISTANT Resistant     AMPICILLIN/SULBACTAM 16 INTERMEDIATE Intermediate     PIP/TAZO <=4 SENSITIVE Sensitive     Extended ESBL NEGATIVE Sensitive     * >=100,000 COLONIES/mL ESCHERICHIA COLI  Urine culture     Status: None (Preliminary result)   Collection Time: 08/22/17 11:26 AM  Result Value Ref Range Status   Specimen Description   Final    URINE, RANDOM Performed at [facility], [address] [place], [place] [address]    Special Requests   Final    NONE Performed at [facility], [address], [place], [place] [address]    Culture   Final    CULTURE REINCUBATED FOR BETTER GROWTH Performed at [facility], [address], [place], [place] [address]    Report Status PENDING  Incomplete  Culture, blood (routine x 2)     Status: None (Preliminary result)   Collection Time: 08/22/17  8:56 PM  Result Value Ref Range Status   Specimen Description BLOOD LEFT ARM  Final   Special Requests   Final    BOTTLES DRAWN AEROBIC AND ANAEROBIC Blood Culture adequate volume   Culture   Final    NO GROWTH 2 DAYS Performed at [facility], [address] [place], [place] [address]    Report Status PENDING  Incomplete  Culture, blood (routine x 2)     Status: None (Preliminary result)   Collection Time: 08/22/17  9:01 PM  Result Value Ref Range Status   Specimen Description BLOOD LEFT HAND  Final   Special Requests   Final    BOTTLES DRAWN AEROBIC AND ANAEROBIC Blood Culture adequate volume   Culture   Final    NO GROWTH 2 DAYS Performed at [facility], [address], [place], [place] [address]    Report Status PENDING  Incomplete    Time coordinating discharge: 33 minutes  SIGNED:  Standley Dakins, MD  [facility] 08/24/2017, 9:21 AM Pager [phone]  If 7PM-7AM, please contact  night-coverage [email or web address] Password [email or web address]

## 2017-08-24 NOTE — Progress Notes (Signed)
Patient states understanding of discharge instructions.  

## 2017-08-25 ENCOUNTER — Other Ambulatory Visit: Payer: Self-pay

## 2017-08-25 ENCOUNTER — Encounter (HOSPITAL_COMMUNITY): Payer: Self-pay | Admitting: Emergency Medicine

## 2017-08-25 ENCOUNTER — Emergency Department (HOSPITAL_COMMUNITY): Payer: BLUE CROSS/BLUE SHIELD

## 2017-08-25 ENCOUNTER — Inpatient Hospital Stay (HOSPITAL_COMMUNITY)
Admission: EM | Admit: 2017-08-25 | Discharge: 2017-08-29 | DRG: 917 | Disposition: A | Discharge status: Home / Self Care | Payer: BLUE CROSS/BLUE SHIELD | Attending: Family Medicine | Admitting: Family Medicine

## 2017-08-25 ENCOUNTER — Inpatient Hospital Stay (HOSPITAL_COMMUNITY): Payer: BLUE CROSS/BLUE SHIELD

## 2017-08-25 DIAGNOSIS — B9620 Unspecified Escherichia coli [E. coli] as the cause of diseases classified elsewhere: Secondary | ICD-10-CM | POA: Diagnosis not present

## 2017-08-25 DIAGNOSIS — J189 Pneumonia, unspecified organism: Secondary | ICD-10-CM | POA: Diagnosis present

## 2017-08-25 DIAGNOSIS — Z8249 Family history of ischemic heart disease and other diseases of the circulatory system: Secondary | ICD-10-CM

## 2017-08-25 DIAGNOSIS — R0902 Hypoxemia: Secondary | ICD-10-CM | POA: Diagnosis present

## 2017-08-25 DIAGNOSIS — J45909 Unspecified asthma, uncomplicated: Secondary | ICD-10-CM | POA: Diagnosis present

## 2017-08-25 DIAGNOSIS — J690 Pneumonitis due to inhalation of food and vomit: Secondary | ICD-10-CM | POA: Diagnosis present

## 2017-08-25 DIAGNOSIS — Z87440 Personal history of urinary (tract) infections: Secondary | ICD-10-CM

## 2017-08-25 DIAGNOSIS — N12 Tubulo-interstitial nephritis, not specified as acute or chronic: Secondary | ICD-10-CM | POA: Diagnosis present

## 2017-08-25 DIAGNOSIS — R7989 Other specified abnormal findings of blood chemistry: Secondary | ICD-10-CM | POA: Diagnosis not present

## 2017-08-25 DIAGNOSIS — T402X1A Poisoning by other opioids, accidental (unintentional), initial encounter: Secondary | ICD-10-CM | POA: Diagnosis present

## 2017-08-25 DIAGNOSIS — R748 Abnormal levels of other serum enzymes: Secondary | ICD-10-CM | POA: Diagnosis present

## 2017-08-25 DIAGNOSIS — J9601 Acute respiratory failure with hypoxia: Secondary | ICD-10-CM

## 2017-08-25 DIAGNOSIS — I2699 Other pulmonary embolism without acute cor pulmonale: Secondary | ICD-10-CM

## 2017-08-25 DIAGNOSIS — G4730 Sleep apnea, unspecified: Secondary | ICD-10-CM | POA: Diagnosis not present

## 2017-08-25 DIAGNOSIS — E876 Hypokalemia: Secondary | ICD-10-CM | POA: Diagnosis present

## 2017-08-25 DIAGNOSIS — E870 Hyperosmolality and hypernatremia: Secondary | ICD-10-CM | POA: Diagnosis present

## 2017-08-25 DIAGNOSIS — F17210 Nicotine dependence, cigarettes, uncomplicated: Secondary | ICD-10-CM | POA: Diagnosis present

## 2017-08-25 DIAGNOSIS — N390 Urinary tract infection, site not specified: Secondary | ICD-10-CM | POA: Diagnosis not present

## 2017-08-25 DIAGNOSIS — I509 Heart failure, unspecified: Secondary | ICD-10-CM | POA: Diagnosis not present

## 2017-08-25 DIAGNOSIS — R404 Transient alteration of awareness: Secondary | ICD-10-CM

## 2017-08-25 DIAGNOSIS — G4733 Obstructive sleep apnea (adult) (pediatric): Secondary | ICD-10-CM | POA: Diagnosis present

## 2017-08-25 DIAGNOSIS — T402X1D Poisoning by other opioids, accidental (unintentional), subsequent encounter: Secondary | ICD-10-CM | POA: Diagnosis not present

## 2017-08-25 LAB — COMPREHENSIVE METABOLIC PANEL
ALBUMIN: 2.1 g/dL — AB (ref 3.5–5.0)
ALT: 75 U/L — AB (ref 14–54)
ANION GAP: 10 (ref 5–15)
AST: 93 U/L — ABNORMAL HIGH (ref 15–41)
Alkaline Phosphatase: 174 U/L — ABNORMAL HIGH (ref 38–126)
BILIRUBIN TOTAL: 0.4 mg/dL (ref 0.3–1.2)
BUN: 15 mg/dL (ref 6–20)
CALCIUM: 7.7 mg/dL — AB (ref 8.9–10.3)
CO2: 22 mmol/L (ref 22–32)
Chloride: 108 mmol/L (ref 101–111)
Creatinine, Ser: 0.74 mg/dL (ref 0.44–1.00)
GFR calc Af Amer: >60 mL/min (ref >60)
GFR calc non Af Amer: >60 mL/min (ref >60)
GLUCOSE: 153 mg/dL — AB (ref 65–99)
Potassium: 3.5 mmol/L (ref 3.5–5.1)
SODIUM: 140 mmol/L (ref 135–145)
TOTAL PROTEIN: 5.7 g/dL — AB (ref 6.5–8.1)

## 2017-08-25 LAB — URINE CULTURE

## 2017-08-25 LAB — BLOOD GAS, ARTERIAL
Acid-Base Excess: 0.6 mmol/L (ref 0.0–2.0)
Bicarbonate: 24.9 mmol/L (ref 20.0–28.0)
Drawn by: 221761
Expiratory PAP: 6
FIO2: 80
INSPIRATORY PAP: 18
LHR: 10 {breaths}/min
Mode: POSITIVE
O2 SAT: 88.3 %
pCO2 arterial: 36.9 mmHg (ref 32.0–48.0)
pH, Arterial: 7.434 (ref 7.350–7.450)
pO2, Arterial: 55.3 mmHg — ABNORMAL LOW (ref 83.0–108.0)

## 2017-08-25 LAB — URINALYSIS, ROUTINE W REFLEX MICROSCOPIC
BILIRUBIN URINE: NEGATIVE
Glucose, UA: NEGATIVE mg/dL
Ketones, ur: NEGATIVE mg/dL
Nitrite: NEGATIVE
PROTEIN: 30 mg/dL — AB
Specific Gravity, Urine: 1.016 (ref 1.005–1.030)
pH: 5 (ref 5.0–8.0)

## 2017-08-25 LAB — CBC WITH DIFFERENTIAL/PLATELET
BASOS ABS: 0 10*3/uL (ref 0.0–0.1)
Basophils Relative: 0 %
Eosinophils Absolute: 0 10*3/uL (ref 0.0–0.7)
Eosinophils Relative: 0 %
HEMATOCRIT: 34.3 % — AB (ref 36.0–46.0)
HEMOGLOBIN: 11.4 g/dL — AB (ref 12.0–15.0)
Lymphocytes Relative: 13 %
Lymphs Abs: 1.3 10*3/uL (ref 0.7–4.0)
MCH: 28.6 pg (ref 26.0–34.0)
MCHC: 33.2 g/dL (ref 30.0–36.0)
MCV: 86.2 fL (ref 78.0–100.0)
MONO ABS: 0.8 10*3/uL (ref 0.1–1.0)
MONOS PCT: 8 %
NEUTROS ABS: 7.8 10*3/uL (ref 1.7–7.7)
NEUTROS PCT: 79 %
Platelets: 162 10*3/uL (ref 150–400)
RBC: 3.98 MIL/uL (ref 3.87–5.11)
RDW: 15.7 % — ABNORMAL HIGH (ref 11.5–15.5)
WBC: 9.9 10*3/uL (ref 4.0–10.5)

## 2017-08-25 LAB — ECHOCARDIOGRAM LIMITED
HEIGHTINCHES: 67 in
WEIGHTICAEL: 3456 [oz_av]

## 2017-08-25 LAB — POC OCCULT BLOOD, ED: Fecal Occult Bld: NEGATIVE

## 2017-08-25 LAB — TSH: TSH: 0.449 u[IU]/mL (ref 0.350–4.500)

## 2017-08-25 LAB — RAPID URINE DRUG SCREEN, HOSP PERFORMED
AMPHETAMINES: POSITIVE — AB
Barbiturates: NOT DETECTED
Benzodiazepines: NOT DETECTED
Cocaine: NOT DETECTED
OPIATES: POSITIVE — AB
Tetrahydrocannabinol: NOT DETECTED

## 2017-08-25 LAB — SALICYLATE LEVEL: Salicylate Lvl: <7 mg/dL (ref 2.8–30.0)

## 2017-08-25 LAB — BRAIN NATRIURETIC PEPTIDE: B NATRIURETIC PEPTIDE 5: 605 pg/mL — AB (ref 0.0–100.0)

## 2017-08-25 LAB — I-STAT CG4 LACTIC ACID, ED: Lactic Acid, Venous: 2.06 mmol/L (ref 0.5–1.9)

## 2017-08-25 LAB — TROPONIN I: Troponin I: 0.15 ng/mL (ref <0.03)

## 2017-08-25 LAB — ETHANOL: Alcohol, Ethyl (B): <10 mg/dL (ref <10)

## 2017-08-25 LAB — INFLUENZA PANEL BY PCR (TYPE A & B)
Influenza A By PCR: NEGATIVE
Influenza B By PCR: NEGATIVE

## 2017-08-25 LAB — ACETAMINOPHEN LEVEL: Acetaminophen (Tylenol), Serum: <10 ug/mL — ABNORMAL LOW (ref 10–30)

## 2017-08-25 LAB — D-DIMER, QUANTITATIVE (NOT AT ARMC): D DIMER QUANT: 4.88 ug{FEU}/mL — AB (ref 0.00–0.50)

## 2017-08-25 LAB — MRSA PCR SCREENING: MRSA BY PCR: NEGATIVE

## 2017-08-25 MED ORDER — SODIUM CHLORIDE 0.9% FLUSH: 3.0000 mL | INTRAVENOUS | Status: DC | PRN

## 2017-08-25 MED ORDER — PANTOPRAZOLE SODIUM 40 MG IV SOLR
40.0000 mg | INTRAVENOUS | Status: DC
  Administered 2017-08-25 – 2017-08-28 (×3): 40 mg via INTRAVENOUS
  Filled 2017-08-25 (×3): qty 40

## 2017-08-25 MED ORDER — FUROSEMIDE 10 MG/ML IJ SOLN: 40.0000 mg | Freq: Once | INTRAMUSCULAR | Status: DC

## 2017-08-25 MED ORDER — ORAL CARE MOUTH RINSE
15.0000 mL | Freq: Two times a day (BID) | OROMUCOSAL | Status: DC
  Administered 2017-08-25 – 2017-08-29 (×4): 15 mL via OROMUCOSAL

## 2017-08-25 MED ORDER — ENOXAPARIN SODIUM 100 MG/ML ~~LOC~~ SOLN
1.0000 mg/kg | Freq: Two times a day (BID) | SUBCUTANEOUS | Status: DC
  Administered 2017-08-25: 100 mg via SUBCUTANEOUS
  Filled 2017-08-25: qty 1

## 2017-08-25 MED ORDER — SODIUM CHLORIDE 0.9% FLUSH
3.0000 mL | Freq: Two times a day (BID) | INTRAVENOUS | Status: DC
  Administered 2017-08-26 – 2017-08-29 (×5): 3 mL via INTRAVENOUS

## 2017-08-25 MED ORDER — FUROSEMIDE 10 MG/ML IJ SOLN
40.0000 mg | Freq: Once | INTRAMUSCULAR | Status: AC
  Administered 2017-08-25: 40 mg via INTRAVENOUS
  Filled 2017-08-25: qty 4

## 2017-08-25 MED ORDER — NALOXONE HCL 2 MG/2ML IJ SOSY
PREFILLED_SYRINGE | INTRAMUSCULAR | Status: AC
  Filled 2017-08-25: qty 2

## 2017-08-25 MED ORDER — ENOXAPARIN SODIUM 40 MG/0.4ML ~~LOC~~ SOLN
40.0000 mg | SUBCUTANEOUS | Status: DC
  Filled 2017-08-25 (×2): qty 0.4

## 2017-08-25 MED ORDER — KETOROLAC TROMETHAMINE 30 MG/ML IJ SOLN
30.0000 mg | Freq: Four times a day (QID) | INTRAMUSCULAR | Status: DC | PRN
  Administered 2017-08-28: 30 mg via INTRAVENOUS
  Filled 2017-08-25: qty 1

## 2017-08-25 MED ORDER — VANCOMYCIN HCL 10 G IV SOLR
2000.0000 mg | Freq: Once | INTRAVENOUS | Status: AC
  Administered 2017-08-25: 2000 mg via INTRAVENOUS
  Filled 2017-08-25: qty 2000

## 2017-08-25 MED ORDER — ACETAMINOPHEN 325 MG PO TABS: 650.0000 mg | ORAL_TABLET | ORAL | Status: DC | PRN

## 2017-08-25 MED ORDER — SODIUM CHLORIDE 0.9 % IV SOLN: 250.0000 mL | INTRAVENOUS | Status: DC | PRN

## 2017-08-25 MED ORDER — IOPAMIDOL (ISOVUE-370) INJECTION 76%
80.0000 mL | Freq: Once | INTRAVENOUS | Status: AC | PRN
  Administered 2017-08-25: 80 mL via INTRAVENOUS

## 2017-08-25 MED ORDER — ONDANSETRON HCL 4 MG/2ML IJ SOLN: 4.0000 mg | Freq: Four times a day (QID) | INTRAMUSCULAR | Status: DC | PRN

## 2017-08-25 MED ORDER — SODIUM CHLORIDE 0.9 % IV SOLN: 1.0000 g | INTRAVENOUS | Status: DC

## 2017-08-25 MED ORDER — LORAZEPAM 2 MG/ML IJ SOLN: 1.0000 mg | INTRAMUSCULAR | Status: DC | PRN

## 2017-08-25 MED ORDER — LORAZEPAM 2 MG/ML IJ SOLN: 0.5000 mg | INTRAMUSCULAR | Status: DC | PRN

## 2017-08-25 MED ORDER — NALOXONE HCL 2 MG/2ML IJ SOSY
PREFILLED_SYRINGE | INTRAMUSCULAR | Status: AC
  Administered 2017-08-25: 2 mg
  Filled 2017-08-25: qty 2

## 2017-08-25 MED ORDER — VANCOMYCIN HCL IN DEXTROSE 1-5 GM/200ML-% IV SOLN
1000.0000 mg | Freq: Three times a day (TID) | INTRAVENOUS | Status: DC
Start: 2017-08-26 — End: 2017-08-27
  Administered 2017-08-26 – 2017-08-27 (×4): 1000 mg via INTRAVENOUS
  Filled 2017-08-25 (×4): qty 200

## 2017-08-25 MED ORDER — ENOXAPARIN SODIUM 40 MG/0.4ML ~~LOC~~ SOLN: 40.0000 mg | SUBCUTANEOUS | Status: DC

## 2017-08-25 MED ORDER — ESCITALOPRAM OXALATE 10 MG PO TABS
20.0000 mg | ORAL_TABLET | Freq: Every day | ORAL | Status: DC
  Administered 2017-08-26 – 2017-08-29 (×4): 20 mg via ORAL
  Filled 2017-08-25 (×4): qty 2

## 2017-08-25 MED ORDER — SODIUM CHLORIDE 0.9 % IV SOLN
3.0000 g | Freq: Four times a day (QID) | INTRAVENOUS | Status: DC
  Administered 2017-08-25 – 2017-08-29 (×15): 3 g via INTRAVENOUS
  Filled 2017-08-25 (×20): qty 3

## 2017-08-25 NOTE — ED Triage Notes (Signed)
Pt brought in from home for altered loc.  Pt being tx for ecoli in urine and was d/c from hospital yesterday with rx for Cipro, hydrocodone, and tessalon.  Did not take any of these meds.  Pt cyanotic on ems arrival to home and to ED.  Initial O2 sat 15% with good pleth and HR matching radial pulse.  Placed on NRB with rise in O2 sat.  MD called to bedside.

## 2017-08-25 NOTE — Progress Notes (Signed)
After consulting Dr.Branch and Dr. Ranae PalmsYelverton over latest ABG results, FiO2 increased to 100%.

## 2017-08-25 NOTE — ED Notes (Signed)
CRITICAL VALUE ALERT  Critical Value:  Troponin - 0.15  Date & Time Notied:  08/25/17  1126  Provider Notified: Dr Ranae PalmsYelverton  Orders Received/Actions taken:

## 2017-08-25 NOTE — H&P (Addendum)
History and Physical  Brooke Espinoza ZOX:[id number] DOB: 1986/04/27 DOA: 08/25/2017  Referring physician: Ranae Palms MD PCP: Rebecka Apley, NP   Chief Complaint: SOB  HPI: Brooke Espinoza is a 32 y.o. female with history of asthma, OSA, chronic nicotine dependence, recently discharged yesterday from hospitalization for right pyelonephritis.  The patient had been refusing Lovenox injections in the hospital.  She apparently was found down at a friend's home and was basically unresponsive and cyanotic.  EMS was called and noted that the patient was severely hypoxic with a pulse ox of 15%.  The patient was severely obtunded when she arrived to the ED.  She was immediately placed on oxygen and her pulse ox improved.  At that point she was placed on BiPAP for respiratory support which she responded very well to.  Later after her mentation improved the patient was questioned and reported that she had no symptoms of chest pain but had severe shortness of breath symptoms.  She denied having palpitations.  Of note, her BNP was elevated and her chest x-ray was significant for signs of pulmonary edema and she was given a dose of IV Lasix in the ED.  Her troponin was mildly elevated but trending down from several days ago.  She was seen by cardiology.  They recommended checking a d-dimer which was markedly elevated at 4.88.  The patient was given Lovenox injection for full anticoagulation pending the results of a CT angios to rule out acute pulmonary embolism.  The patient was obtunded on arrival but responded to a dose of naloxone given in ED and became much more alert and responsive.  The patient's urine drug screen was positive for opioids and amphetamines which she is known to be taking for adult ADD.  Patient is being admitted with a presumed diagnosis of acute pulmonary embolus to the stepdown unit on BiPAP.  Review of Systems: All systems reviewed and apart from history of presenting illness, are  negative.  Past Medical History:  Diagnosis Date  . Asthma    as a child  . Depression   . Gestational diabetes   . Recurrent UTI   . Renal disorder   . Sleep apnea    not using cpap   Past Surgical History:  Procedure Laterality Date  . ELBOW SURGERY Left   . LAPAROSCOPIC BILATERAL SALPINGECTOMY Bilateral 01/01/2013   Procedure: LAPAROSCOPIC BILATERAL SALPINGECTOMY;  Surgeon: Tilda Burrow, MD;  Location: [facility];  Service: Gynecology;  Laterality: Bilateral;  . TUBAL LIGATION     Social History:  reports that she has been smoking cigarettes.  She has been smoking about 1.00 pack per day. she has never used smokeless tobacco. She reports that she drinks about 0.6 oz of alcohol per week. She reports that she does not use drugs.  No Known Allergies  Family History  Problem Relation Age of Onset  . Hypertension Mother   . Mental illness Mother        no specific diagnosis referred to as a chemical imbalance  . Depression Mother   . Hypertension Father   . Thyroid disease Father   . Heart disease Father   . Depression Father   . Anxiety disorder Father   . Heart disease Maternal Grandmother   . Diabetes Maternal Grandmother   . Hypertension Maternal Grandmother   . Dementia Maternal Grandfather     Prior to Admission medications   Medication Sig Start Date End Date Taking? Authorizing Provider  acetaminophen (  TYLENOL) 500 MG tablet Take 1,000 mg by mouth every 6 (six) hours as needed.   Yes [provider]  amphetamine-dextroamphetamine (ADDERALL) 20 MG tablet Take 1 tablet by mouth 2 (two) times daily. 11/06/15 08/25/17 Yes [provider]  benzonatate (TESSALON) 100 MG capsule Take 1-2 capsules (100-200 mg total) by mouth 3 (three) times daily as needed for cough. 08/24/17  Yes Johnson, Clanford L, MD  ciprofloxacin (CIPRO) 500 MG tablet Take 1 tablet (500 mg total) by mouth 2 (two) times daily for 12 days. 08/24/17 09/05/17 Yes Johnson, Clanford L, MD    clonazePAM (KLONOPIN) 1 MG tablet Take 1 mg by mouth 3 (three) times daily.   Yes [provider]  escitalopram (LEXAPRO) 20 MG tablet Take 20 mg by mouth daily.   Yes [provider]  HYDROcodone-acetaminophen (NORCO/VICODIN) 5-325 MG tablet Take 1 tablet by mouth every 6 (six) hours as needed for severe pain. 08/24/17  Yes Cleora FleetJohnson, Clanford L, MD   Physical Exam: Vitals:   08/25/17 1300 08/25/17 1310 08/25/17 1345 08/25/17 1400  BP: 129/85   (!) 111/96  Pulse: 77  78 89  Resp: (!) 28  (!) 22   Temp:      TempSrc:      SpO2: 96% 91% 97% 96%  Weight:      Height:         General exam: Moderately built and nourished patient, lying comfortably supine on the gurney in no obvious distress.  Patient is on BiPAP therapy.  Head, eyes and ENT: Nontraumatic and normocephalic. Pupils equally reacting to light and accommodation. Oral mucosa moist.  Neck: Supple. No JVD, carotid bruit or thyromegaly.  Lymphatics: No lymphadenopathy.  Respiratory system: Clear to auscultation. No increased work of breathing.  Cardiovascular system: S1 and S2 heard, RRR. No JVD, murmurs, gallops, clicks or pedal edema.  Gastrointestinal system: Abdomen is nondistended, soft and nontender. Normal bowel sounds heard. No organomegaly or masses appreciated.  Central nervous system: Alert and oriented. No focal neurological deficits.  Extremities: Symmetric 5 x 5 power. Peripheral pulses symmetrically felt.   Skin: No rashes or acute findings.  Musculoskeletal system: Negative exam.  Psychiatry: Pleasant and cooperative.  Labs on Admission:  Basic Metabolic Panel: Recent Labs  Lab 08/20/17 1521 08/22/17 1125 08/23/17 0352 08/24/17 0348 08/25/17 1026  NA 140 138 140 140 140  K 3.2* 3.0* 3.6 4.1 3.5  CL 104 106 113* 112* 108  CO2 26 23 20* 20* 22  GLUCOSE 114* 141* 85 121* 153*  BUN 12 25* 27* 20 15  CREATININE 0.78 1.70* 1.47* 0.96 0.74  CALCIUM 8.9 7.8* 7.1* 7.4* 7.7*    Liver Function Tests: Recent Labs  Lab 08/22/17 1125 08/23/17 0352 08/24/17 0348 08/25/17 1026  AST 21 25 26  93*  ALT 18 23 27  75*  ALKPHOS 81 98 112 174*  BILITOT 0.5 0.5 0.7 0.4  PROT 5.2* 5.0* 5.2* 5.7*  ALBUMIN 2.3* 2.1* 2.1* 2.1*   Recent Labs  Lab 08/22/17 1125  LIPASE 18   No results for input(s): AMMONIA in the last 168 hours. CBC: Recent Labs  Lab 08/20/17 1521 08/22/17 1125 08/23/17 0352 08/24/17 0348 08/25/17 1026  WBC 13.6* 9.6 6.3 7.7 9.9  NEUTROABS  --  8.8  --  6.6 7.8  HGB 13.7 11.0* 10.4* 10.8* 11.4*  HCT 42.6 33.9* 31.8* 32.9* 34.3*  MCV 89.1 89.2 88.1 87.5 86.2  PLT 162 92* 123* 131* 162   Cardiac Enzymes: Recent Labs  Lab  08/22/17 2101 08/23/17 0352 08/23/17 0926 08/25/17 1026  TROPONINI 0.19* 0.18* 0.19* 0.15*    BNP (last 3 results) No results for input(s): PROBNP in the last 8760 hours. CBG: Recent Labs  Lab 08/20/17 1421 08/22/17 1142  GLUCAP 139* 126*    Radiological Exams on Admission: Dg Chest Port 1 View  Result Date: 08/25/2017 CLINICAL DATA:  Shortness of breath, asthma, smoker EXAM: PORTABLE CHEST 1 VIEW COMPARISON:  08/22/2017 FINDINGS: Bilateral interstitial and alveolar airspace opacities in a perihilar distribution. No pleural effusion or pneumothorax. Stable cardiomediastinal silhouette. No acute osseous abnormality. IMPRESSION: Bilateral perihilar interstitial and alveolar airspace opacities which may reflect pulmonary edema versus multilobar pneumonia. Electronically Signed   By: Elige Ko   On: 08/25/2017 10:40    EKG: Independently reviewed.  Normal sinus rhythm no acute ST-T wave abnormalities.  Assessment/Plan Active Problems:   Asthma   Sleep apnea   Pyelonephritis   Acute respiratory failure with hypoxia (HCC)   Opioid overdose (HCC)   Elevated liver enzymes   Acute heart failure (HCC)   Elevated brain natriuretic peptide (BNP) level   E. coli UTI   Acute pulmonary embolism (HCC)  1. Acute  respiratory failure with hypoxia-I suspect that this is secondary to an acute pulmonary embolus.  The patient will continue respiratory support with BiPAP therapy.  She will be admitted to the stepdown unit.  She does not have any findings to suggest pneumonia.  She has no fever no elevated white blood cell count.  We will continue to monitor clinically and adjust therapy as needed.  I have asked for a pulmonary consultation for tomorrow. 2. Presumed Acute pulmonary embolus-anticoagulation with full dose Lovenox has been ordered.  We will likely transition to NOAC when medically appropriate.  Continue supportive therapy as noted above. 3. E. coli UTI with right pyelonephritis-we will continue treatment with IV ceftriaxone and transitioned to ciprofloxacin when patient is taking oral medications.  She will need to continue and complete the full 14-day course of therapy for this. 4. Elevated liver enzymes-concerning for right heart failure, echocardiogram pending, follow labs. 5. Acute heart failure-I suspect this is secondary to the acute pulmonary embolus.  She has been given IV Lasix and is diuresing at this time.  An echocardiogram has been ordered by the cardiology service.  Her recent echo findings reported an EF of 55%.  No diastolic dysfunction was noted on her echo from last week. 6. OSA-she is currently on BiPAP but will offer nightly CPAP when she has been weaned off the BiPAP. 7. Asthma-we will provide nebulizer treatments as needed.  Continue supportive therapy with BiPAP as ordered.  DVT Prophylaxis: Lovenox Code Status: Full Family Communication: Mother at bedside Disposition Plan: Home when medically stable  Time spent: 62 minutes  Standley Dakins, MD [facility] Pager [phone]  If 7PM-7AM, please contact night-coverage [email or web address] Password [facility] 08/25/2017, 3:06 PM

## 2017-08-25 NOTE — ED Provider Notes (Signed)
[facility] EMERGENCY DEPARTMENT Provider Note   CSN: [id number] Arrival date & time: 08/25/17  1010     History   Chief Complaint Chief Complaint  Patient presents with  . Altered Mental Status    HPI Brooke Espinoza is a 32 y.o. female.  HPI Patient with altered mental status and unable to contribute to history.  Level 5 caveat.  EMS called to patient's residence.  Found to be cyanotic and unresponsive.  Recently discharged from the hospital for pyelonephritis and sepsis.  EMS reports patient's discharge medications at bedside with no apparent missing tablets. Past Medical History:  Diagnosis Date  . Asthma    as a child  . Depression   . Gestational diabetes   . Recurrent UTI   . Renal disorder   . Sleep apnea    not using cpap    Patient Active Problem List   Diagnosis Date Noted  . Acute respiratory failure with hypoxia (HCC) 08/25/2017  . Opioid overdose (HCC) 08/25/2017  . Elevated liver enzymes 08/25/2017  . Acute heart failure (HCC) 08/25/2017  . Elevated brain natriuretic peptide (BNP) level 08/25/2017  . E. coli UTI 08/25/2017  . Pyelonephritis 08/22/2017  . Hypotension 08/22/2017  . ARF (acute renal failure) (HCC)   . Asthma 11/13/2014  . Sleep apnea 11/13/2014  . MDD (major depressive disorder), recurrent episode, severe (HCC) 10/14/2014  . 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 Irregular menses 10/06/2014  . Sterilization 01/01/2013    Past Surgical History:  Procedure Laterality Date  . ELBOW SURGERY Left   . LAPAROSCOPIC BILATERAL SALPINGECTOMY Bilateral 01/01/2013   Procedure: LAPAROSCOPIC BILATERAL SALPINGECTOMY;  Surgeon: Tilda BurrowJohn V Ferguson, MD;  Location: [facility];  Service: Gynecology;  Laterality: Bilateral;  . TUBAL LIGATION      OB History    Gravida Para Term Preterm AB Living   4 3 3  0 1 3   SAB TAB Ectopic Multiple Live Births   0 0 1 0 3       Home Medications    Prior to Admission medications   Medication Sig Start Date End Date  Taking? Authorizing Provider  acetaminophen (TYLENOL) 500 MG tablet Take 1,000 mg by mouth every 6 (six) hours as needed.   Yes [provider]  amphetamine-dextroamphetamine (ADDERALL) 20 MG tablet Take 1 tablet by mouth 2 (two) times daily. 11/06/15 08/25/17 Yes [provider]  benzonatate (TESSALON) 100 MG capsule Take 1-2 capsules (100-200 mg total) by mouth 3 (three) times daily as needed for cough. 08/24/17  Yes Johnson, Clanford L, MD  ciprofloxacin (CIPRO) 500 MG tablet Take 1 tablet (500 mg total) by mouth 2 (two) times daily for 12 days. 08/24/17 09/05/17 Yes Johnson, Clanford L, MD  clonazePAM (KLONOPIN) 1 MG tablet Take 1 mg by mouth 3 (three) times daily.   Yes [provider]  escitalopram (LEXAPRO) 20 MG tablet Take 20 mg by mouth daily.   Yes [provider]  HYDROcodone-acetaminophen (NORCO/VICODIN) 5-325 MG tablet Take 1 tablet by mouth every 6 (six) hours as needed for severe pain. 08/24/17  Yes Cleora FleetJohnson, Clanford L, MD    Family History Family History  Problem Relation Age of Onset  . Hypertension Mother   . Mental illness Mother        no specific diagnosis referred to as a chemical imbalance  . Depression Mother   . Hypertension Father   . Thyroid disease Father   . Heart disease Father   . Depression Father   .  Anxiety disorder Father   . Heart disease Maternal Grandmother   . Diabetes Maternal Grandmother   . Hypertension Maternal Grandmother   . Dementia Maternal Grandfather     Social History Social History   Tobacco Use  . Smoking status: Current Every Day Smoker    Packs/day: 1.00    Types: Cigarettes    Last attempt to quit: 04/01/2015    Years since quitting: 2.4  . Smokeless tobacco: Never Used  Substance Use Topics  . Alcohol use: Yes    Alcohol/week: 0.6 oz    Types: 1 Glasses of wine per week    Comment: socially per patient report  . Drug use: No     Allergies   Patient has no known allergies.   Review  of Systems Review of Systems  Unable to perform ROS: Mental status change     Physical Exam Updated Vital Signs BP (!) 111/96   Pulse 89   Temp 97.9 F (36.6 C) (Oral)   Resp (!) 22   Ht 5\' 7"  (1.702 m)   Wt 98 kg (216 lb)   LMP 08/06/2017   SpO2 96%   BMI 33.83 kg/m   Physical Exam  Constitutional: She appears well-developed and well-nourished.  Cyanotic and drowsy  HENT:  Head: Normocephalic and atraumatic.  Mouth/Throat: Oropharynx is clear and moist.  Eyes: EOM are normal. Pupils are equal, round, and reactive to light.  Neck: Normal range of motion. Neck supple.  No meningismus  Cardiovascular: Normal rate and regular rhythm.  Pulmonary/Chest: Effort normal.  Diminished breath sounds throughout with scattered rhonchi  Abdominal: Soft. Bowel sounds are normal. There is tenderness. There is no rebound and no guarding.  Right upper quadrant tenderness to palpation.  No rebound or guarding.  Musculoskeletal: Normal range of motion. She exhibits no edema or tenderness.  No lower extremity swelling, asymmetry or tenderness.  Distal pulses are 2+.  Neurological:  Patient is drowsy but arousable.  Moving all extremities without focal deficit.  Skin: Skin is warm and dry. No rash noted. No erythema.  Nursing note and vitals reviewed.    ED Treatments / Results  Labs (all labs ordered are listed, but only abnormal results are displayed) Labs Reviewed  CBC WITH DIFFERENTIAL/PLATELET - Abnormal; Notable for the following components:      Result Value   Hemoglobin 11.4 (*)    HCT 34.3 (*)    RDW 15.7 (*)    All other components within normal limits  COMPREHENSIVE METABOLIC PANEL - Abnormal; Notable for the following components:   Glucose, Bld 153 (*)    Calcium 7.7 (*)    Total Protein 5.7 (*)    Albumin 2.1 (*)    AST 93 (*)    ALT 75 (*)    Alkaline Phosphatase 174 (*)    All other components within normal limits  TROPONIN I - Abnormal; Notable for the  following components:   Troponin I 0.15 (*)    All other components within normal limits  ACETAMINOPHEN LEVEL - Abnormal; Notable for the following components:   Acetaminophen (Tylenol), Serum <10 (*)    All other components within normal limits  RAPID URINE DRUG SCREEN, HOSP PERFORMED - Abnormal; Notable for the following components:   Opiates POSITIVE (*)    Amphetamines POSITIVE (*)    All other components within normal limits  BLOOD GAS, ARTERIAL - Abnormal; Notable for the following components:   pO2, Arterial 72.2 (*)    All other components  within normal limits  BRAIN NATRIURETIC PEPTIDE - Abnormal; Notable for the following components:   B Natriuretic Peptide 605.0 (*)    All other components within normal limits  BLOOD GAS, ARTERIAL - Abnormal; Notable for the following components:   pO2, Arterial 55.3 (*)    All other components within normal limits  I-STAT CG4 LACTIC ACID, ED - Abnormal; Notable for the following components:   Lactic Acid, Venous 2.06 (*)    All other components within normal limits  CULTURE, BLOOD (ROUTINE X 2)  CULTURE, BLOOD (ROUTINE X 2)  ETHANOL  SALICYLATE LEVEL  URINALYSIS, ROUTINE W REFLEX MICROSCOPIC  INFLUENZA PANEL BY PCR (TYPE A & B)  D-DIMER, QUANTITATIVE (NOT AT [facility])  POC OCCULT BLOOD, ED    EKG  EKG Interpretation  Date/Time:  Friday August 25 2017 12:47:37 EST Ventricular Rate:  79 PR Interval:    QRS Duration: 94 QT Interval:  393 QTC Calculation: 451 R Axis:   68 Text Interpretation:  Sinus rhythm Short PR interval Confirmed by Loren Racer ([id number]) on 08/25/2017 1:09:57 PM       Radiology Dg Chest Port 1 View  Result Date: 08/25/2017 CLINICAL DATA:  Shortness of breath, asthma, smoker EXAM: PORTABLE CHEST 1 VIEW COMPARISON:  08/22/2017 FINDINGS: Bilateral interstitial and alveolar airspace opacities in a perihilar distribution. No pleural effusion or pneumothorax. Stable cardiomediastinal silhouette. No acute  osseous abnormality. IMPRESSION: Bilateral perihilar interstitial and alveolar airspace opacities which may reflect pulmonary edema versus multilobar pneumonia. Electronically Signed   By: Elige Ko   On: 08/25/2017 10:40    Procedures .Critical Care Performed by: Loren Racer, MD Authorized by: Loren Racer, MD   Critical care provider statement:    Critical care time (minutes):  55   Critical care start time:  08/25/2017 10:10 AM   Critical care end time:  08/25/2017 2:30 PM   Critical care time was exclusive of:  Separately billable procedures and treating other patients   Critical care was necessary to treat or prevent imminent or life-threatening deterioration of the following conditions:  Respiratory failure   Critical care was time spent personally by me on the following activities:  Pulse oximetry, ordering and review of radiographic studies, ordering and review of laboratory studies, ordering and performing treatments and interventions, re-evaluation of patient's condition, review of old charts, obtaining history from patient or surrogate, examination of patient, evaluation of patient's response to treatment, discussions with consultants and development of treatment plan with patient or surrogate   (including critical care time)  Medications Ordered in ED Medications  naloxone (NARCAN) 2 MG/2ML injection (not administered)  naloxone (NARCAN) 2 MG/2ML injection (2 mg  Given 08/25/17 1049)  furosemide (LASIX) injection 40 mg (40 mg Intravenous Given 08/25/17 1304)     Initial Impression / Assessment and Plan / ED Course  I have reviewed the triage vital signs and the nursing notes.  Pertinent labs & imaging results that were available during my care of the patient were reviewed by me and considered in my medical decision making (see chart for details).     Patient became more alert after Narcan was given.  Initial severe hypoxia improved with nonrebreather.  Discussed  possibility of intubation.  Patient states she is willing to try BiPAP.  X-ray with bilateral infiltrates most consistent with pulmonary edema.  Likely ARDS due to recent infection and multiple IV fluid boluses. Patient tolerating BiPAP well.  Have been able to wean off 100% FiO2.  Given dose of  IV Lasix.  Discussed with cardiology who will see patient in the emergency department.  Hospitalist will see and admit to stepdown bed.   Final Clinical Impressions(s) / ED Diagnoses   Final diagnoses:  Hypoxia  Altered consciousness    ED Discharge Orders    None       Loren Racer, MD 08/25/17 1432

## 2017-08-25 NOTE — Consult Note (Signed)
Cardiology Consultation:   Patient ID: PEBBLE BOTKIN; [id number]; 1985-07-23   Admit date: 08/25/2017 Date of Consult: 08/25/2017  Primary Care Provider: Bridget Hartshorn, NP Primary Cardiologist: n/a    Patient Profile:   Brooke Espinoza is a 32 y.o. female with a hx of asthma and OSA who is being seen today for the evaluation of SOB at the request of Dr Wynetta Emery.   History of Present Illness:   Brooke Espinoza is a 32 yo female with history of OSA, asthma, tobacco abuse. Recent admission just a few days ago for pyelonephritis, treated for sepsis secondary to [facility] UTI. Given IVFs for AKI during that [place].   Presents with severe SOB. History limited as patient has on bipap and difficultly speak. Her SOB appears to have come on fairly suddenly today. Reports some cough. No fevers or chills, no chest pain. No recent edema.    WBC 9.9 Hgb 11.4 Plt 162 K 3.5 Cr 0.74 AST 93 ALT 75 Alk phos 174 UDS +opiates +amphetamines lactic acid 2.06 BNP 605  ABG on bipap 100% Fio2 7.4/38/72 CXR bilateral perihilar interstitial and alverop opacities, edema vs multilobar pneumonia Trop 0.15--> Echo 08/2017 LVEF 50-55%, no WMAs, normal diastolic function, dilated IVC  Past Medical History:  Diagnosis Date  . Asthma    as a child  . Depression   . Gestational diabetes   . Recurrent UTI   . Renal disorder   . Sleep apnea    not using cpap    Past Surgical History:  Procedure Laterality Date  . ELBOW SURGERY Left   . LAPAROSCOPIC BILATERAL SALPINGECTOMY Bilateral 01/01/2013   Procedure: LAPAROSCOPIC BILATERAL SALPINGECTOMY;  Surgeon: Jonnie Kind, MD;  Location: [facility];  Service: Gynecology;  Laterality: Bilateral;  . TUBAL LIGATION         Inpatient Medications: Scheduled Meds: . naloxone       Continuous Infusions:  PRN Meds:   Allergies:   No Known Allergies  Social History:   Social History   Socioeconomic History  . Marital status: Single    Spouse name: Not on  file  . Number of children: Not on file  . Years of education: Not on file  . Highest education level: Not on file  Social Needs  . Financial resource strain: Not on file  . Food insecurity - worry: Not on file  . Food insecurity - inability: Not on file  . Transportation needs - medical: Not on file  . Transportation needs - non-medical: Not on file  Occupational History  . Not on file  Tobacco Use  . Smoking status: Current Every Day Smoker    Packs/day: 1.00    Types: Cigarettes    Last attempt to quit: 04/01/2015    Years since quitting: 2.4  . Smokeless tobacco: Never Used  Substance and Sexual Activity  . Alcohol use: Yes    Alcohol/week: 0.6 oz    Types: 1 Glasses of wine per week    Comment: socially per patient report  . Drug use: No  . Sexual activity: Yes    Birth control/protection: Surgical  Other Topics Concern  . Not on file  Social History Narrative  . Not on file    Family History:    Family History  Problem Relation Age of Onset  . Hypertension Mother   . Mental illness Mother        no specific diagnosis referred to as a chemical imbalance  . Depression Mother   .  Hypertension Father   . Thyroid disease Father   . Heart disease Father   . Depression Father   . Anxiety disorder Father   . Heart disease Maternal Grandmother   . Diabetes Maternal Grandmother   . Hypertension Maternal Grandmother   . Dementia Maternal Grandfather      ROS:  Please see the history of present illness.  All other ROS reviewed and negative.     Physical Exam/Data:   Vitals:   08/25/17 1230 08/25/17 1300 08/25/17 1310 08/25/17 1345  BP: (!) 133/93 129/85    Pulse: 85 77  78  Resp: (!) 23 (!) 28  (!) 22  Temp:      TempSrc:      SpO2: 95% 96% 91% 97%  Weight:      Height:        Intake/Output Summary (Last 24 hours) at 08/25/2017 1400 Last data filed at 08/25/2017 1347 Gross per 24 hour  Intake -  Output 900 ml  Net -900 ml   Filed Weights   08/25/17  1034  Weight: 216 lb (98 kg)   Body mass index is 33.83 kg/m.  General:  Well nourished, well developed, in no acute distress HEENT: normal Neck: no JVD Cardiac:  normal S1, S2; RRR; no murmur  Lungs:  clear to auscultation bilaterally, no wheezing, rhonchi or rales  Abd: soft, nontender, no hepatomegaly  Ext: no edema Musculoskeletal:  No deformities, BUE and BLE strength normal and equal Skin: warm and dry  Neuro:  CNs 2-12 intact, no focal abnormalities noted Psych:  Normal affect     Laboratory Data:  Chemistry Recent Labs  Lab 2017/09/21 0352 08/24/17 0348 08/25/17 1026  NA 140 140 140  K 3.6 4.1 3.5  CL 113* 112* 108  CO2 20* 20* 22  GLUCOSE 85 121* 153*  BUN 27* 20 15  CREATININE 1.47* 0.96 0.74  CALCIUM 7.1* 7.4* 7.7*  GFRNONAA 47* >60 >60  GFRAA 54* >60 >60  ANIONGAP _0 Recent Labs  Lab 09/21/17 0352 08/24/17 0348 08/25/17 1026  PROT 5.0* 5.2* 5.7*  ALBUMIN 2.1* 2.1* 2.1*  AST 25 26 93*  ALT 23 27 75*  ALKPHOS 98 112 174*  BILITOT 0.5 0.7 0.4   Hematology Recent Labs  Lab 21-Sep-2017 0352 08/24/17 0348 08/25/17 1026  WBC 6.3 7.7 9.9  RBC 3.61* 3.76* 3.98  HGB 10.4* 10.8* 11.4*  HCT 31.8* 32.9* 34.3*  MCV 88.1 87.5 86.2  MCH 28.8 28.7 28.6  MCHC 32.7 32.8 33.2  RDW 14.6 15.4 15.7*  PLT 123* 131* 162   Cardiac Enzymes Recent Labs  Lab 08/22/17 2101 2017-09-21 0352 21-Sep-2017 0926 08/25/17 1026  TROPONINI 0.19* 0.18* 0.19* 0.15*   No results for input(s): TROPIPOC in the last 168 hours.  BNP Recent Labs  Lab 08/25/17 1118  BNP 605.0*    DDimer No results for input(s): DDIMER in the last 168 hours.  Radiology/Studies:  US Renal  Result Date: September 21, 2017 CLINICAL DATA:  Pyelonephritis EXAM: RENAL / URINARY TRACT ULTRASOUND COMPLETE COMPARISON:  None. FINDINGS: Right Kidney: Length: 14.3 cm. Echogenicity and renal cortical thickness are within normal limits. No hydronephrosis hydronephrosis visualized. There is no well-defined  mass. However, there is increased echogenicity along the periphery of the upper pole the right kidney, an appearance suggesting acute lobar nephronia/bacterial nephritis in this region on the right. This area of abnormal echogenicity measures approximately 3.8 x 2.2 cm. There is no frank abscess seen. Minimal perinephric  fluid is seen near this area of apparent acute lobar nephronia. Left Kidney: Length: 14.1 cm. Echogenicity and renal cortical thickness are within normal limits. No mass, perinephric fluid, or hydronephrosis visualized. No sonographically demonstrable calculus or ureterectasis. Bladder: Appears normal for degree of bladder distention. IMPRESSION: Focal increase in echogenicity along the upper pole of the right kidney peripherally, an appearance felt to be indicative of acute lobar nephronia/bacterial nephritis. No well-defined mass or abscess. Minimal perinephric fluid in the area of apparent inflammation on the right. Study otherwise unremarkable. These results will be called to the ordering clinician or representative by the Radiologist Assistant, and communication documented in the PACS or zVision Dashboard. Electronically Signed   By: Lowella Grip III M.D.   On: 08/23/2017 10:15   Dg Chest Port 1 View  Result Date: 08/25/2017 CLINICAL DATA:  Shortness of breath, asthma, smoker EXAM: PORTABLE CHEST 1 VIEW COMPARISON:  08/22/2017 FINDINGS: Bilateral interstitial and alveolar airspace opacities in a perihilar distribution. No pleural effusion or pneumothorax. Stable cardiomediastinal silhouette. No acute osseous abnormality. IMPRESSION: Bilateral perihilar interstitial and alveolar airspace opacities which may reflect pulmonary edema versus multilobar pneumonia. Electronically Signed   By: Kathreen Devoid   On: 08/25/2017 10:40   Dg Chest Port 1 View  Result Date: 08/22/2017 CLINICAL DATA:  Hypotension. EXAM: PORTABLE CHEST 1 VIEW COMPARISON:  Chest x-ray dated September 03, 2014.  FINDINGS: The heart size and mediastinal contours are within normal limits. Normal pulmonary vascularity. Subsegmental atelectasis at the left lung base. No focal consolidation, pleural effusion, or pneumothorax. No acute osseous abnormality. IMPRESSION: Left basilar subsegmental atelectasis.  No active disease. Electronically Signed   By: Titus Dubin M.D.   On: 08/22/2017 11:44    Assessment and Plan:   1. SOB/Hypoxia - acute fairly severe hypoxia, pO2 in 70s on 100% bipap - CXR with edema vs infiltrate. Body habitus limits exam for volume status. By her last echo she had normal function, but did have a dilated IVC suggesting elevated RA pressure. Mildly elevated BNP. She did require IVFs during recent admission with AKI. However, weights are stable since discharge. Possibility of infectious process as well given CXR pattern, she does report some cough. With a recent hospitalization must also consider possible PE, particularly given her severity of hypoxia. - we will obtain stat limited echo to evaluate RV and LV function. She has received lasix 3m IV x 1.  - order flu panel, defer possible abx to primary - order D-dimer. If positive I would consider emperic full dose lovenox until stable enough for CT scan.      For questions or updates, please contact CScurryPlease consult [email or web address] for contact info under Cardiology/STEMI.   SMerrily Pew MD  08/25/2017 2:00 PM

## 2017-08-25 NOTE — Progress Notes (Signed)
BiPAP cancelled, patient to be intubated due to lung white out per Dr. Ranae PalmsYelverton. ABG will be obtained post intubation. Patient on NRB with SpO2 of 95%.

## 2017-08-25 NOTE — ED Notes (Signed)
Pt was placed on bedpan to have a BM and when pt was cleaned up, a softball diameter size pool of bright red blood was seen on the sheets. It appeared to not be rectal bleeding. Hemmocult was performed and was negative. Pt denies being on her menses and says it is not time for it. EDP made aware.

## 2017-08-25 NOTE — ED Notes (Signed)
Respiratory at bedside to place bipap  

## 2017-08-25 NOTE — Progress Notes (Signed)
Pharmacy Antibiotic Note  Brooke Espinoza is a 32 y.o. female admitted on 08/25/2017 with pneumonia.  Pharmacy has been consulted for Vancomycin dosing.  Plan: Vancomycin 2000 mg IV x 1 dose followed by Vancomycin 1000 mg IV every 8 hours.  Goal trough 15-20 mcg/mL.  Monitor labs, cultures, and trough as needed  Height: 5\' 7"  (170.2 cm) Weight: 201 lb 11.5 oz (91.5 kg) IBW/kg (Calculated) : 61.6  Temp (24hrs), Avg:98.3 F (36.8 C), Min:97.9 F (36.6 C), Max:98.7 F (37.1 C)  Recent Labs  Lab 08/20/17 1521 08/22/17 1125 08/23/17 0352 08/24/17 0348 08/25/17 1026 08/25/17 1034  WBC 13.6* 9.6 6.3 7.7 9.9  --   CREATININE 0.78 1.70* 1.47* 0.96 0.74  --   LATICACIDVEN  --   --   --   --   --  2.06*    Estimated Creatinine Clearance: 119.5 mL/min (by C-G formula based on SCr of 0.74 mg/dL).    No Known Allergies  Antimicrobials this admission: Vancomycin 2/15 >>  Unasyn 2/15 >>   Dose adjustments this admission: N/A  Microbiology results: 2/15 BCx: pending 2/15 MRSA PCR: pending  Thank you for allowing pharmacy to be a part of this patient's care.  Judeth CornfieldSteven Harmonii Karle, PharmD Clinical Pharmacist 08/25/2017 4:57 PM

## 2017-08-25 NOTE — Progress Notes (Signed)
*  PRELIMINARY RESULTS* Echocardiogram 2D Echocardiogram LIMITED has been performed.  Jeryl Columbialliott, Derika Eckles 08/25/2017, 2:59 PM

## 2017-08-26 ENCOUNTER — Inpatient Hospital Stay (HOSPITAL_COMMUNITY): Payer: BLUE CROSS/BLUE SHIELD

## 2017-08-26 DIAGNOSIS — J189 Pneumonia, unspecified organism: Secondary | ICD-10-CM

## 2017-08-26 DIAGNOSIS — J690 Pneumonitis due to inhalation of food and vomit: Secondary | ICD-10-CM

## 2017-08-26 LAB — BLOOD GAS, ARTERIAL
ACID-BASE EXCESS: 0.3 mmol/L (ref 0.0–2.0)
Acid-Base Excess: 5.6 mmol/L — ABNORMAL HIGH (ref 0.0–2.0)
BICARBONATE: 24.7 mmol/L (ref 20.0–28.0)
Bicarbonate: 29.3 mmol/L — ABNORMAL HIGH (ref 20.0–28.0)
DELIVERY SYSTEMS: POSITIVE
DRAWN BY: 221791
DRAWN BY: 28459
EXPIRATORY PAP: 6
Expiratory PAP: 7
FIO2: 100
FIO2: 60
INSPIRATORY PAP: 18
INSPIRATORY PAP: 14
Mode: POSITIVE
O2 SAT: 93.5 %
O2 SAT: 90.5 %
PCO2 ART: 38.6 mmHg (ref 32.0–48.0)
PCO2 ART: 42.8 mmHg (ref 32.0–48.0)
PO2 ART: 72.2 mmHg — AB (ref 83.0–108.0)
PO2 ART: 61.3 mmHg — AB (ref 83.0–108.0)
Patient temperature: 37
RATE: 10 resp/min
pH, Arterial: 7.415 (ref 7.350–7.450)
pH, Arterial: 7.454 — ABNORMAL HIGH (ref 7.350–7.450)

## 2017-08-26 LAB — CBC WITH DIFFERENTIAL/PLATELET
BASOS PCT: 0 %
Basophils Absolute: 0 10*3/uL (ref 0.0–0.1)
EOS ABS: 0 10*3/uL (ref 0.0–0.7)
EOS PCT: 0 %
HCT: 30.1 % — ABNORMAL LOW (ref 36.0–46.0)
Hemoglobin: 10.2 g/dL — ABNORMAL LOW (ref 12.0–15.0)
LYMPHS ABS: 1.3 10*3/uL (ref 0.7–4.0)
Lymphocytes Relative: 15 %
MCH: 29.4 pg (ref 26.0–34.0)
MCHC: 33.9 g/dL (ref 30.0–36.0)
MCV: 86.7 fL (ref 78.0–100.0)
MONO ABS: 0.7 10*3/uL (ref 0.1–1.0)
MONOS PCT: 8 %
NEUTROS PCT: 77 %
Neutro Abs: 6.7 10*3/uL (ref 1.7–7.7)
PLATELETS: 162 10*3/uL (ref 150–400)
RBC: 3.47 MIL/uL — ABNORMAL LOW (ref 3.87–5.11)
RDW: 15.8 % — ABNORMAL HIGH (ref 11.5–15.5)
WBC: 8.8 10*3/uL (ref 4.0–10.5)

## 2017-08-26 LAB — MAGNESIUM: Magnesium: 1.8 mg/dL (ref 1.7–2.4)

## 2017-08-26 LAB — COMPREHENSIVE METABOLIC PANEL
ALK PHOS: 155 U/L — AB (ref 38–126)
ALT: 48 U/L (ref 14–54)
AST: 33 U/L (ref 15–41)
Albumin: 2 g/dL — ABNORMAL LOW (ref 3.5–5.0)
Anion gap: 10 (ref 5–15)
BUN: 17 mg/dL (ref 6–20)
CALCIUM: 7.5 mg/dL — AB (ref 8.9–10.3)
CO2: 27 mmol/L (ref 22–32)
CREATININE: 0.75 mg/dL (ref 0.44–1.00)
Chloride: 109 mmol/L (ref 101–111)
GFR calc Af Amer: >60 mL/min (ref >60)
GLUCOSE: 123 mg/dL — AB (ref 65–99)
POTASSIUM: 3.1 mmol/L — AB (ref 3.5–5.1)
Sodium: 146 mmol/L — ABNORMAL HIGH (ref 135–145)
TOTAL PROTEIN: 5.4 g/dL — AB (ref 6.5–8.1)
Total Bilirubin: 0.9 mg/dL (ref 0.3–1.2)

## 2017-08-26 LAB — BRAIN NATRIURETIC PEPTIDE: B NATRIURETIC PEPTIDE 5: 139 pg/mL — AB (ref 0.0–100.0)

## 2017-08-26 MED ORDER — POTASSIUM CHLORIDE CRYS ER 20 MEQ PO TBCR
40.0000 meq | EXTENDED_RELEASE_TABLET | Freq: Once | ORAL | Status: AC
  Administered 2017-08-26: 40 meq via ORAL
  Filled 2017-08-26: qty 2

## 2017-08-26 MED ORDER — DEXTROSE 5 % IV BOLUS
500.0000 mL | Freq: Once | INTRAVENOUS | Status: AC
  Administered 2017-08-26: 500 mL via INTRAVENOUS

## 2017-08-26 MED ORDER — MAGNESIUM SULFATE 2 GM/50ML IV SOLN
2.0000 g | Freq: Once | INTRAVENOUS | Status: AC
Start: 2017-08-26 — End: 2017-08-26
  Administered 2017-08-26: 2 g via INTRAVENOUS
  Filled 2017-08-26: qty 50

## 2017-08-26 NOTE — Progress Notes (Signed)
PROGRESS NOTE    Brooke THORINGTON  NWG:[id number]  DOB: 09-04-1985  DOA: 08/25/2017 PCP: Rebecka Apley, NP   Brief Admission Hx: Brooke Espinoza is a 32 y.o. female with history of asthma, OSA, chronic nicotine dependence, recently discharged yesterday from hospitalization for right pyelonephritis.  The patient had been refusing Lovenox injections in the hospital.  She apparently was found down at a friend's home and was basically unresponsive and cyanotic.  MDM/Assessment & Plan:   1. Acute respiratory failure with hypoxia-CTA negative for PE.  She does have multifocal pneumonia likely from aspiration while she was down.  I suspect opioid overdose as she responded to naloxone given in ED.  Continue bipap as needed.  Continue IV antibiotics.  Pulmonary consulted.  2. E. coli UTI with right pyelonephritis-She is on IV unasyn for now for aspiration pneumonia.   3. Elevated liver enzymes-improving.  4. Acute heart failure-She was given a dose of IV lasix in ED and has diuresed well.   5. OSA-she is currently on BiPAP but will offer nightly CPAP when she has been weaned off the BiPAP. 6. Asthma-we will provide nebulizer treatments as needed.  Continue supportive therapy with BiPAP as ordered. 7. Hypokalemia - replacement ordered.  Magnesium IV ordered. 8. Hypernatremia - small fluid bolus of D5W ordered.   DVT Prophylaxis: Lovenox Code Status: Full Family Communication: Mother at bedside Disposition Plan: Home when medically stable  Consultants:  pulmonology  Procedures:  bipap  Subjective: Pt is alert now and wants bipap off.    Objective: Vitals:   08/26/17 0810 08/26/17 0900 08/26/17 1000 08/26/17 1106  BP:      Pulse: 72 61 62 63  Resp: (!) 22 20 (!) 23 20  Temp: 97.9 F (36.6 C)   98 F (36.7 C)  TempSrc: Oral   Oral  SpO2: 94% 96% 94% 97%  Weight:      Height:        Intake/Output Summary (Last 24 hours) at 08/26/2017 1131 Last data filed at 08/26/2017  1015 Gross per 24 hour  Intake 860 ml  Output 4450 ml  Net -3590 ml   Filed Weights   08/25/17 1034 08/25/17 1640 08/26/17 0400  Weight: 98 kg (216 lb) 91.5 kg (201 lb 11.5 oz) 91.5 kg (201 lb 11.5 oz)     REVIEW OF SYSTEMS  As per history otherwise all reviewed and reported negative  Exam:  General exam: pt on bipap.  Awake, alert, NAD. Cooperative.  Respiratory system: shallow breathing bilateral. On bipap. Cardiovascular system: S1 & S2 heard, RRR. No JVD, murmurs, gallops, clicks or pedal edema. Gastrointestinal system: Abdomen is nondistended, soft and nontender. Normal bowel sounds heard. Central nervous system: Alert and oriented. No focal neurological deficits. Extremities: no CCE.  Data Reviewed: Basic Metabolic Panel: Recent Labs  Lab 08/22/17 1125 08/23/17 0352 08/24/17 0348 08/25/17 1026 08/26/17 0411  NA 138 140 140 140 146*  K 3.0* 3.6 4.1 3.5 3.1*  CL 106 113* 112* 108 109  CO2 23 20* 20* 22 27  GLUCOSE 141* 85 121* 153* 123*  BUN 25* 27* 20 15 17   CREATININE 1.70* 1.47* 0.96 0.74 0.75  CALCIUM 7.8* 7.1* 7.4* 7.7* 7.5*  MG  --   --   --   --  1.8   Liver Function Tests: Recent Labs  Lab 08/22/17 1125 08/23/17 0352 08/24/17 0348 08/25/17 1026 08/26/17 0411  AST 21 25 26  93* 33  ALT 18 23 27  75* 48  ALKPHOS 81 98 112 174* 155*  BILITOT 0.5 0.5 0.7 0.4 0.9  PROT 5.2* 5.0* 5.2* 5.7* 5.4*  ALBUMIN 2.3* 2.1* 2.1* 2.1* 2.0*   Recent Labs  Lab 08/22/17 1125  LIPASE 18   No results for input(s): AMMONIA in the last 168 hours. CBC: Recent Labs  Lab 08/22/17 1125 08/23/17 0352 08/24/17 0348 08/25/17 1026 08/26/17 0411  WBC 9.6 6.3 7.7 9.9 8.8  NEUTROABS 8.8  --  6.6 7.8 6.7  HGB 11.0* 10.4* 10.8* 11.4* 10.2*  HCT 33.9* 31.8* 32.9* 34.3* 30.1*  MCV 89.2 88.1 87.5 86.2 86.7  PLT 92* 123* 131* 162 162   Cardiac Enzymes: Recent Labs  Lab 08/22/17 2101 08/23/17 0352 08/23/17 0926 08/25/17 1026  TROPONINI 0.19* 0.18* 0.19* 0.15*    CBG (last 3)  No results for input(s): GLUCAP in the last 72 hours. Recent Results (from the past 240 hour(s))  Urine C&S     Status: Abnormal   Collection Time: 08/20/17  3:56 PM  Result Value Ref Range Status   Specimen Description   Final    URINE, CLEAN CATCH Performed at [facility], [address] [place], [place] [address]    Special Requests   Final    NONE Performed at [facility], [address], [place], [place] [address]    Culture >=100,000 COLONIES/mL ESCHERICHIA COLI (A)  Final   Report Status 08/22/2017 FINAL  Final   Organism ID, Bacteria ESCHERICHIA COLI (A)  Final      Susceptibility   Escherichia coli - MIC*    AMPICILLIN >=32 RESISTANT Resistant     CEFAZOLIN <=4 SENSITIVE Sensitive     CEFTRIAXONE <=1 SENSITIVE Sensitive     CIPROFLOXACIN <=0.25 SENSITIVE Sensitive     GENTAMICIN <=1 SENSITIVE Sensitive     IMIPENEM <=0.25 SENSITIVE Sensitive     NITROFURANTOIN <=16 SENSITIVE Sensitive     TRIMETH/SULFA >=320 RESISTANT Resistant     AMPICILLIN/SULBACTAM 16 INTERMEDIATE Intermediate     PIP/TAZO <=4 SENSITIVE Sensitive     Extended ESBL NEGATIVE Sensitive     * >=100,000 COLONIES/mL ESCHERICHIA COLI  Urine culture     Status: Abnormal   Collection Time: 08/22/17 11:26 AM  Result Value Ref Range Status   Specimen Description   Final    URINE, RANDOM Performed at [facility], [address], [place], [place] [address]    Special Requests   Final    NONE Performed at [facility], [address], [place], [place] [address]    Culture (A)  Final    2,000 COLONIES/mL GROUP B STREP(S.AGALACTIAE)ISOLATED WITH MIXED BACTERIAL ORGANISMS TESTING AGAINST S. AGALACTIAE NOT ROUTINELY PERFORMED DUE TO PREDICTABILITY OF AMP/PEN/VAN SUSCEPTIBILITY. Performed at [facility], [address], [place], [place] [address]    Report Status 08/25/2017 FINAL  Final  Culture, blood (routine x 2)     Status: None (Preliminary result)   Collection Time: 08/22/17   8:56 PM  Result Value Ref Range Status   Specimen Description BLOOD LEFT ARM  Final   Special Requests   Final    BOTTLES DRAWN AEROBIC AND ANAEROBIC Blood Culture adequate volume   Culture   Final    NO GROWTH 4 DAYS Performed at [facility], [address] [place], [place] [address]    Report Status PENDING  Incomplete  Culture, blood (routine x 2)     Status: None (Preliminary result)   Collection Time: 08/22/17  9:01 PM  Result Value Ref Range  Status   Specimen Description BLOOD LEFT HAND  Final   Special Requests   Final    BOTTLES DRAWN AEROBIC AND ANAEROBIC Blood Culture adequate volume   Culture   Final    NO GROWTH 4 DAYS Performed at [facility], [address] [place], [place] [address]    Report Status PENDING  Incomplete  Culture, blood (Routine X 2) w Reflex to ID Panel     Status: None (Preliminary result)   Collection Time: 08/25/17 11:10 AM  Result Value Ref Range Status   Specimen Description BLOOD RIGHT HAND  Final   Special Requests   Final    BOTTLES DRAWN AEROBIC AND ANAEROBIC Blood Culture adequate volume   Culture   Final    NO GROWTH < 24 HOURS Performed at [facility], [address], [place], [place] [address]    Report Status PENDING  Incomplete  Culture, blood (Routine X 2) w Reflex to ID Panel     Status: None (Preliminary result)   Collection Time: 08/25/17 11:24 AM  Result Value Ref Range Status   Specimen Description BLOOD LEFT HAND  Final   Special Requests   Final    BOTTLES DRAWN AEROBIC AND ANAEROBIC Blood Culture adequate volume   Culture   Final    NO GROWTH < 24 HOURS Performed at [facility], [address], [place], [place] [address]    Report Status PENDING  Incomplete  MRSA PCR Screening     Status: None   Collection Time: 08/25/17  5:20 PM  Result Value Ref Range Status   MRSA by PCR NEGATIVE NEGATIVE Final    Comment:        The GeneXpert MRSA Assay (FDA approved for NASAL specimens only), is one component of  a comprehensive MRSA colonization surveillance program. It is not intended to diagnose MRSA infection nor to guide or monitor treatment for MRSA infections. Performed at [facility], [address], [place], [place] [address]      Studies: Ct Angio Chest Pe W Or Wo Contrast  Result Date: 08/25/2017 CLINICAL DATA:  Evaluate for pulmonary embolus. High pretest probability. Shortness of breath. EXAM: CT ANGIOGRAPHY CHEST WITH CONTRAST TECHNIQUE: Multidetector CT imaging of the chest was performed using the standard protocol during bolus administration of intravenous contrast. Multiplanar CT image reconstructions and MIPs were obtained to evaluate the vascular anatomy. CONTRAST:  80mL ISOVUE-370 IOPAMIDOL (ISOVUE-370) INJECTION 76% COMPARISON:  None FINDINGS: Cardiovascular: The heart size appears normal. No pericardial effusion. The main pulmonary artery is patent. No saddle embolus or central obstructing embolus identified. No lobar or segmental pulmonary artery filling defects identified. Mediastinum/Nodes: Normal appearance of the thyroid gland. The trachea appears patent and is midline. Normal appearance of the esophagus. No mediastinal or hilar adenopathy. No axillary or supraclavicular adenopathy. Lungs/Pleura: There are moderate bilateral pleural effusions. Extensive bilateral multifocal airspace consolidation involving the upper and lower lobes with surrounding areas of ground-glass attenuation. Upper Abdomen: No acute abnormality. Musculoskeletal: No chest wall abnormality. No acute or significant osseous findings. Review of the MIP images confirms the above findings. IMPRESSION: 1. No evidence for acute pulmonary emboli. 2. Since 08/22/2017 there has been interval development of extensive bilateral multi lobar airspace consolidation with surrounding ground-glass attenuation and moderate bilateral pleural effusions. Differential considerations are broad including but not limited to multi lobar  pneumonia, drug toxicity, or aspiration. Electronically Signed   By: Signa Kell M.D.   On: 08/25/2017 16:32   Dg Chest [facility]  Result Date: 08/26/2017 CLINICAL DATA:  Multifocal pneumonia EXAM: PORTABLE CHEST 1 VIEW COMPARISON:  08/25/2017 FINDINGS: Severe bilateral airspace disease left greater than right is stable. No effusion. Heart size upper normal. IMPRESSION: Severe bilateral airspace disease unchanged.  No new findings. Electronically Signed   By: Marlan Palauharles  Clark M.D.   On: 08/26/2017 08:30   Dg Chest Port 1 View  Result Date: 08/25/2017 CLINICAL DATA:  Shortness of breath, asthma, smoker EXAM: PORTABLE CHEST 1 VIEW COMPARISON:  08/22/2017 FINDINGS: Bilateral interstitial and alveolar airspace opacities in a perihilar distribution. No pleural effusion or pneumothorax. Stable cardiomediastinal silhouette. No acute osseous abnormality. IMPRESSION: Bilateral perihilar interstitial and alveolar airspace opacities which may reflect pulmonary edema versus multilobar pneumonia. Electronically Signed   By: Elige KoHetal  Patel   On: 08/25/2017 10:40   Scheduled Meds: . enoxaparin (LOVENOX) injection  40 mg Subcutaneous Q24H  . escitalopram  20 mg Oral Daily  . mouth rinse  15 mL Mouth Rinse BID  . pantoprazole (PROTONIX) IV  40 mg Intravenous Q24H  . sodium chloride flush  3 mL Intravenous Q12H   Continuous Infusions: . sodium chloride    . ampicillin-sulbactam (UNASYN) IV 3 g (08/26/17 1125)  . vancomycin Stopped (08/26/17 1122)    Principal Problem:   Acute respiratory failure with hypoxia (HCC) Active Problems:   Asthma   Sleep apnea   Pyelonephritis   Opioid overdose (HCC)   Elevated liver enzymes   Acute heart failure (HCC)   Elevated brain natriuretic peptide (BNP) level   E. coli UTI   Multifocal pneumonia   Aspiration pneumonia [facility](HCC)  Critical Care Time spent: 34 mins  Standley Dakinslanford Johnson, MD, FAAFP [facility] Pager [phone]  If 7PM-7AM, please contact  night-coverage [email or web address] Password [email or web address] 08/26/2017, 11:31 AM    LOS: 1 day

## 2017-08-26 NOTE — Consult Note (Signed)
Consult requested by: [facility] hospitalist, Dr. Laural Benes Consult requested for: Acute hypoxic respiratory failure  HPI: This is a 32 year old who has asthma sleep apnea chronic nicotine use and who had been discharged from the hospital on the day prior to admission after having had pyelonephritis.  She does not use CPAP.  She had childhood asthma.  She went to a friend's home and was found unresponsive and cyanotic.  EMS was called she was found to be hypoxic with pulse ox measuring 15%.  She was obtunded.  She was placed on oxygen and she did better.  She was placed on BiPAP in the emergency department.  She said that she did not have any chest pain but that she was severely short of breath.  She had elevated BNP chest x-ray was consistent with pulmonary edema versus aspiration and she was given IV Lasix.  She has had an elevated troponin and it was still elevated.  D-dimer was done which was markedly elevated she was set up for full anticoagulation but CT angiogram somewhat surprisingly did not show pulmonary emboli she did have diffuse bilateral infiltrates with groundglass changes.  I have personally reviewed CT and chest x-ray.  She stayed on BiPAP last night and is now on high flow nasal cannula.  She says she feels much more comfortable.  I think she probably aspirated.  She had positive urine drug screen for opioids and amphetamines.  She is on both of those medications.  This morning she says she is coughing a little bit.  Her breathing is better.  She is not having any chest pain.  No nausea or vomiting.  She does have a headache.  Past Medical History:  Diagnosis Date  . Asthma    as a child  . Depression   . Gestational diabetes   . Recurrent UTI   . Renal disorder   . Sleep apnea    not using cpap     Family History  Problem Relation Age of Onset  . Hypertension Mother   . Mental illness Mother        no specific diagnosis referred to as a chemical imbalance  . Depression Mother   .  Hypertension Father   . Thyroid disease Father   . Heart disease Father   . Depression Father   . Anxiety disorder Father   . Heart disease Maternal Grandmother   . Diabetes Maternal Grandmother   . Hypertension Maternal Grandmother   . Dementia Maternal Grandfather      Social History   Socioeconomic History  . Marital status: Single    Spouse name: None  . Number of children: None  . Years of education: None  . Highest education level: None  Social Needs  . Financial resource strain: None  . Food insecurity - worry: None  . Food insecurity - inability: None  . Transportation needs - medical: None  . Transportation needs - non-medical: None  Occupational History  . None  Tobacco Use  . Smoking status: Current Every Day Smoker    Packs/day: 1.00    Types: Cigarettes    Last attempt to quit: 04/01/2015    Years since quitting: 2.4  . Smokeless tobacco: Never Used  Substance and Sexual Activity  . Alcohol use: Yes    Alcohol/week: 0.6 oz    Types: 1 Glasses of wine per week    Comment: socially per patient report  . Drug use: No  . Sexual activity: Yes    Birth  control/protection: Surgical  Other Topics Concern  . None  Social History Narrative  . None     ROS: Other than as mentioned 10 point review of systems is negative    Objective: Vital signs in last 24 hours: Temp:  [97.9 F (36.6 C)-99.8 F (37.7 C)] 97.9 F (36.6 C) (02/16 0810) Pulse Rate:  [55-97] 72 (02/16 0810) Resp:  [15-28] 22 (02/16 0810) BP: (110-134)/(74-96) 123/79 (02/16 0700) SpO2:  [15 %-100 %] 94 % (02/16 0810) FiO2 (%):  [60 %-100 %] 60 % (02/16 0421) Weight:  [91.5 kg (201 lb 11.5 oz)-98 kg (216 lb)] 91.5 kg (201 lb 11.5 oz) (02/16 0400) Weight change:  Last BM Date: 08/25/17  Intake/Output from previous day: 02/15 0701 - 02/16 0700 In: 300 [IV Piggyback:300] Out: 4450 [Urine:4450]  PHYSICAL EXAM Constitutional: She is awake and alert has a mildly elevated respiratory rate  but no distress.  Cardiovascular: Her heart is regular with minimal tachycardia.  No gallop.  Respiratory: She has some fine rales bilaterally.  Eyes: Pupils react.  EOMI.  Ears nose mouth and throat: Her mucous membranes are moist.  Throat is clear.  Hearing is grossly normal.  Gastrointestinal: Her abdomen is soft with no masses.  Skin: Warm and dry.  Musculoskeletal: Grossly normal strength bilaterally.  Neurological: No focal abnormalities.  Psychiatric: Normal mood and affect  Lab Results: Basic Metabolic Panel: Recent Labs    08/25/17 1026 08/26/17 0411  NA 140 146*  K 3.5 3.1*  CL 108 109  CO2 22 27  GLUCOSE 153* 123*  BUN 15 17  CREATININE 0.74 0.75  CALCIUM 7.7* 7.5*  MG  --  1.8   Liver Function Tests: Recent Labs    08/25/17 1026 08/26/17 0411  AST 93* 33  ALT 75* 48  ALKPHOS 174* 155*  BILITOT 0.4 0.9  PROT 5.7* 5.4*  ALBUMIN 2.1* 2.0*   No results for input(s): LIPASE, AMYLASE in the last 72 hours. No results for input(s): AMMONIA in the last 72 hours. CBC: Recent Labs    08/25/17 1026 08/26/17 0411  WBC 9.9 8.8  NEUTROABS 7.8 6.7  HGB 11.4* 10.2*  HCT 34.3* 30.1*  MCV 86.2 86.7  PLT 162 162   Cardiac Enzymes: Recent Labs    08/23/17 0926 08/25/17 1026  TROPONINI 0.19* 0.15*   BNP: No results for input(s): PROBNP in the last 72 hours. D-Dimer: Recent Labs    08/25/17 1210  DDIMER 4.88*   CBG: No results for input(s): GLUCAP in the last 72 hours. Hemoglobin A1C: No results for input(s): HGBA1C in the last 72 hours. Fasting Lipid Panel: No results for input(s): CHOL, HDL, LDLCALC, TRIG, CHOLHDL, LDLDIRECT in the last 72 hours. Thyroid Function Tests: Recent Labs    08/25/17 1026  TSH 0.449   Anemia Panel: No results for input(s): VITAMINB12, FOLATE, FERRITIN, TIBC, IRON, RETICCTPCT in the last 72 hours. Coagulation: No results for input(s): LABPROT, INR in the last 72 hours. Urine Drug Screen: Drugs of Abuse     Component Value  Date/Time   LABOPIA POSITIVE (A) 08/25/2017 1026   COCAINSCRNUR NONE DETECTED 08/25/2017 1026   LABBENZ NONE DETECTED 08/25/2017 1026   AMPHETMU POSITIVE (A) 08/25/2017 1026   THCU NONE DETECTED 08/25/2017 1026   LABBARB NONE DETECTED 08/25/2017 1026    Alcohol Level: Recent Labs    08/25/17 1026  ETH <10   Urinalysis: Recent Labs    08/25/17 1202  COLORURINE AMBER*  LABSPEC 1.016  PHURINE 5.0  GLUCOSEU NEGATIVE  HGBUR SMALL*  BILIRUBINUR NEGATIVE  KETONESUR NEGATIVE  PROTEINUR 30*  NITRITE NEGATIVE  LEUKOCYTESUR TRACE*   Misc. Labs:   ABGS: Recent Labs    08/26/17 0635  PHART 7.454*  PO2ART 61.3*  HCO3 29.3*     MICROBIOLOGY: Recent Results (from the past 240 hour(s))  Urine C&S     Status: Abnormal   Collection Time: 08/20/17  3:56 PM  Result Value Ref Range Status   Specimen Description   Final    URINE, CLEAN CATCH Performed at [facility], [address], [place], [place] [address]    Special Requests   Final    NONE Performed at [facility], [address] [place], [place] [address]    Culture >=100,000 COLONIES/mL ESCHERICHIA COLI (A)  Final   Report Status 08/22/2017 FINAL  Final   Organism ID, Bacteria ESCHERICHIA COLI (A)  Final      Susceptibility   Escherichia coli - MIC*    AMPICILLIN >=32 RESISTANT Resistant     CEFAZOLIN <=4 SENSITIVE Sensitive     CEFTRIAXONE <=1 SENSITIVE Sensitive     CIPROFLOXACIN <=0.25 SENSITIVE Sensitive     GENTAMICIN <=1 SENSITIVE Sensitive     IMIPENEM <=0.25 SENSITIVE Sensitive     NITROFURANTOIN <=16 SENSITIVE Sensitive     TRIMETH/SULFA >=320 RESISTANT Resistant     AMPICILLIN/SULBACTAM 16 INTERMEDIATE Intermediate     PIP/TAZO <=4 SENSITIVE Sensitive     Extended ESBL NEGATIVE Sensitive     * >=100,000 COLONIES/mL ESCHERICHIA COLI  Urine culture     Status: Abnormal   Collection Time: 08/22/17 11:26 AM  Result Value Ref Range Status   Specimen Description   Final    URINE, RANDOM Performed at  [facility], [address], [place], [place] [address]    Special Requests   Final    NONE Performed at [facility], [address] [place], [place] [address]    Culture (A)  Final    2,000 COLONIES/mL GROUP B STREP(S.AGALACTIAE)ISOLATED WITH MIXED BACTERIAL ORGANISMS TESTING AGAINST S. AGALACTIAE NOT ROUTINELY PERFORMED DUE TO PREDICTABILITY OF AMP/PEN/VAN SUSCEPTIBILITY. Performed at [facility], [address], [place], [place] [address]    Report Status 08/25/2017 FINAL  Final  Culture, blood (routine x 2)     Status: None (Preliminary result)   Collection Time: 08/22/17  8:56 PM  Result Value Ref Range Status   Specimen Description BLOOD LEFT ARM  Final   Special Requests   Final    BOTTLES DRAWN AEROBIC AND ANAEROBIC Blood Culture adequate volume   Culture   Final    NO GROWTH 4 DAYS Performed at [facility], [address], [place], [place] [address]    Report Status PENDING  Incomplete  Culture, blood (routine x 2)     Status: None (Preliminary result)   Collection Time: 08/22/17  9:01 PM  Result Value Ref Range Status   Specimen Description BLOOD LEFT HAND  Final   Special Requests   Final    BOTTLES DRAWN AEROBIC AND ANAEROBIC Blood Culture adequate volume   Culture   Final    NO GROWTH 4 DAYS Performed at [facility], [address], [place], [place] [address]    Report Status PENDING  Incomplete  Culture, blood (Routine X 2) w Reflex to ID Panel     Status: None (Preliminary result)   Collection Time: 08/25/17 11:10 AM  Result Value Ref Range Status   Specimen Description BLOOD RIGHT HAND  Final   Special Requests   Final    BOTTLES DRAWN AEROBIC AND ANAEROBIC Blood Culture adequate volume   Culture   Final    NO GROWTH < 24 HOURS Performed at [facility], [address], [place], [place] [address]    Report Status PENDING  Incomplete  Culture, blood (Routine X 2) w Reflex to ID Panel     Status: None (Preliminary result)   Collection Time: 08/25/17  11:24 AM  Result Value Ref Range Status   Specimen Description BLOOD LEFT HAND  Final   Special Requests   Final    BOTTLES DRAWN AEROBIC AND ANAEROBIC Blood Culture adequate volume   Culture   Final    NO GROWTH < 24 HOURS Performed at [facility], [address] [place], [place] [address]    Report Status PENDING  Incomplete  MRSA PCR Screening     Status: None   Collection Time: 08/25/17  5:20 PM  Result Value Ref Range Status   MRSA by PCR NEGATIVE NEGATIVE Final    Comment:        The GeneXpert MRSA Assay (FDA approved for NASAL specimens only), is one component of a comprehensive MRSA colonization surveillance program. It is not intended to diagnose MRSA infection nor to guide or monitor treatment for MRSA infections. Performed at [facility], [address] [place], [place] [address]     Studies/Results: Ct Angio Chest Pe W Or Wo Contrast  Result Date: 08/25/2017 CLINICAL DATA:  Evaluate for pulmonary embolus. High pretest probability. Shortness of breath. EXAM: CT ANGIOGRAPHY CHEST WITH CONTRAST TECHNIQUE: Multidetector CT imaging of the chest was performed using the standard protocol during bolus administration of intravenous contrast. Multiplanar CT image reconstructions and MIPs were obtained to evaluate the vascular anatomy. CONTRAST:  80mL ISOVUE-370 IOPAMIDOL (ISOVUE-370) INJECTION 76% COMPARISON:  None FINDINGS: Cardiovascular: The heart size appears normal. No pericardial effusion. The main pulmonary artery is patent. No saddle embolus or central obstructing embolus identified. No lobar or segmental pulmonary artery filling defects identified. Mediastinum/Nodes: Normal appearance of the thyroid gland. The trachea appears patent and is midline. Normal appearance of the esophagus. No mediastinal or hilar adenopathy. No axillary or supraclavicular adenopathy. Lungs/Pleura: There are moderate bilateral pleural effusions. Extensive bilateral multifocal airspace  consolidation involving the upper and lower lobes with surrounding areas of ground-glass attenuation. Upper Abdomen: No acute abnormality. Musculoskeletal: No chest wall abnormality. No acute or significant osseous findings. Review of the MIP images confirms the above findings. IMPRESSION: 1. No evidence for acute pulmonary emboli. 2. Since 08/22/2017 there has been interval development of extensive bilateral multi lobar airspace consolidation with surrounding ground-glass attenuation and moderate bilateral pleural effusions. Differential considerations are broad including but not limited to multi lobar pneumonia, drug toxicity, or aspiration. Electronically Signed   By: Signa Kell M.D.   On: 08/25/2017 16:32   Dg Chest Port 1 View  Result Date: 08/26/2017 CLINICAL DATA:  Multifocal pneumonia EXAM: PORTABLE CHEST 1 VIEW COMPARISON:  08/25/2017 FINDINGS: Severe bilateral airspace disease left greater than right is stable. No effusion. Heart size upper normal. IMPRESSION: Severe bilateral airspace disease unchanged.  No new findings. Electronically Signed   By: Marlan Palau M.D.   On: 08/26/2017 08:30   Dg Chest Port 1 View  Result Date: 08/25/2017 CLINICAL DATA:  Shortness of breath, asthma, smoker EXAM: PORTABLE CHEST 1 VIEW COMPARISON:  08/22/2017 FINDINGS: Bilateral interstitial and alveolar airspace opacities in a perihilar distribution. No pleural effusion or pneumothorax. Stable  cardiomediastinal silhouette. No acute osseous abnormality. IMPRESSION: Bilateral perihilar interstitial and alveolar airspace opacities which may reflect pulmonary edema versus multilobar pneumonia. Electronically Signed   By: Elige Ko   On: 08/25/2017 10:40    Medications:  Prior to Admission:  Medications Prior to Admission  Medication Sig Dispense Refill Last Dose  . acetaminophen (TYLENOL) 500 MG tablet Take 1,000 mg by mouth every 6 (six) hours as needed.   Past Week at Unknown time  .  amphetamine-dextroamphetamine (ADDERALL) 20 MG tablet Take 1 tablet by mouth 2 (two) times daily.   Past Week at Unknown time  . benzonatate (TESSALON) 100 MG capsule Take 1-2 capsules (100-200 mg total) by mouth 3 (three) times daily as needed for cough. 25 capsule 0 Past Week at Unknown time  . ciprofloxacin (CIPRO) 500 MG tablet Take 1 tablet (500 mg total) by mouth 2 (two) times daily for 12 days. 24 tablet 0 Past Week at Unknown time  . clonazePAM (KLONOPIN) 1 MG tablet Take 1 mg by mouth 3 (three) times daily.   Past Week at Unknown time  . escitalopram (LEXAPRO) 20 MG tablet Take 20 mg by mouth daily.   Past Week at Unknown time  . HYDROcodone-acetaminophen (NORCO/VICODIN) 5-325 MG tablet Take 1 tablet by mouth every 6 (six) hours as needed for severe pain. 15 tablet 0 Past Week at Unknown time   Scheduled: . enoxaparin (LOVENOX) injection  40 mg Subcutaneous Q24H  . escitalopram  20 mg Oral Daily  . mouth rinse  15 mL Mouth Rinse BID  . pantoprazole (PROTONIX) IV  40 mg Intravenous Q24H  . sodium chloride flush  3 mL Intravenous Q12H   Continuous: . sodium chloride    . ampicillin-sulbactam (UNASYN) IV 3 g (08/26/17 6295)  . vancomycin Stopped (08/26/17 0551)   MWU:XLKGMW chloride, acetaminophen, ketorolac, LORazepam, ondansetron (ZOFRAN) IV, sodium chloride flush  Assesment: She was admitted with acute hypoxic respiratory failure after being found down at home.  She has sleep apnea at baseline and does not use CPAP.  She did use BiPAP overnight but now is on high flow nasal cannula.  She has chest x-ray that is consistent with multifocal pneumonia presumably from aspiration while she was down.  Being appropriately treated for that with Unasyn.  There has been concern that she may have had some element of pulmonary edema and she was treated with Lasix  There was strong clinical suspicion that she might have had pulmonary emboli but CT does not show that.  It appears that she may  have had opioid overdose.  She had elevated BNP level.  She had elevated liver function testing which is now back to normal with the exception of alkaline phosphatase.  Her albumin is low at 2   Principal Problem:   Acute respiratory failure with hypoxia (HCC) Active Problems:   Asthma   Sleep apnea   Pyelonephritis   Opioid overdose (HCC)   Elevated liver enzymes   Acute heart failure (HCC)   Elevated brain natriuretic peptide (BNP) level   E. coli UTI   Multifocal pneumonia   Aspiration pneumonia (HCC)    Plan: Continue current treatments.  She is oxygenating okay on high flow nasal cannula at this time.  Would put her back on BiPAP on clinical grounds    LOS: 1 day   Makail Watling L 08/26/2017, 8:55 AM

## 2017-08-27 LAB — COMPREHENSIVE METABOLIC PANEL
ALT: 37 U/L (ref 14–54)
AST: 20 U/L (ref 15–41)
Albumin: 2 g/dL — ABNORMAL LOW (ref 3.5–5.0)
Alkaline Phosphatase: 152 U/L — ABNORMAL HIGH (ref 38–126)
Anion gap: 9 (ref 5–15)
BILIRUBIN TOTAL: 0.6 mg/dL (ref 0.3–1.2)
BUN: 14 mg/dL (ref 6–20)
CHLORIDE: 105 mmol/L (ref 101–111)
CO2: 27 mmol/L (ref 22–32)
Calcium: 7.3 mg/dL — ABNORMAL LOW (ref 8.9–10.3)
Creatinine, Ser: 0.62 mg/dL (ref 0.44–1.00)
Glucose, Bld: 108 mg/dL — ABNORMAL HIGH (ref 65–99)
POTASSIUM: 3.3 mmol/L — AB (ref 3.5–5.1)
Sodium: 141 mmol/L (ref 135–145)
TOTAL PROTEIN: 5.1 g/dL — AB (ref 6.5–8.1)

## 2017-08-27 LAB — CULTURE, BLOOD (ROUTINE X 2)
Culture: NO GROWTH
Culture: NO GROWTH
Special Requests: ADEQUATE
Special Requests: ADEQUATE

## 2017-08-27 LAB — MAGNESIUM: MAGNESIUM: 2 mg/dL (ref 1.7–2.4)

## 2017-08-27 MED ORDER — GUAIFENESIN ER 600 MG PO TB12
1200.0000 mg | ORAL_TABLET | Freq: Two times a day (BID) | ORAL | Status: DC
  Administered 2017-08-27 – 2017-08-29 (×5): 1200 mg via ORAL
  Filled 2017-08-27 (×5): qty 2

## 2017-08-27 MED ORDER — POTASSIUM CHLORIDE CRYS ER 20 MEQ PO TBCR
40.0000 meq | EXTENDED_RELEASE_TABLET | Freq: Once | ORAL | Status: AC
  Administered 2017-08-27: 40 meq via ORAL
  Filled 2017-08-27: qty 2

## 2017-08-27 NOTE — Progress Notes (Signed)
Subjective: She feels better.  She is still requiring high flow oxygen.  She is coughing nonproductively.  Objective: Vital signs in last 24 hours: Temp:  [97.9 F (36.6 C)-99 F (37.2 C)] 98.1 F (36.7 C) (02/17 0744) Pulse Rate:  [51-75] 60 (02/17 0900) Resp:  [14-29] 27 (02/17 0900) BP: (114-125)/(55-82) 125/71 (02/17 0600) SpO2:  [91 %-97 %] 94 % (02/17 0900) Weight:  [93.9 kg (207 lb 0.2 oz)] 93.9 kg (207 lb 0.2 oz) (02/17 0500) Weight change: -4.077 kg (-15.8 oz) Last BM Date: 08/25/17  Intake/Output from previous day: 02/16 0701 - 02/17 0700 In: 1700 [P.O.:1200; IV Piggyback:500] Out: -   PHYSICAL EXAM General appearance: alert, cooperative and mild distress Resp: rhonchi bilaterally Cardio: regular rate and rhythm, S1, S2 normal, no murmur, click, rub or gallop GI: soft, non-tender; bowel sounds normal; no masses,  no organomegaly Extremities: extremities normal, atraumatic, no cyanosis or edema Skin warm and dry  Lab Results:  Results for orders placed or performed during the hospital encounter of 08/25/17 (from the past 48 hour(s))  Culture, blood (Routine X 2) w Reflex to ID Panel     Status: None (Preliminary result)   Collection Time: 08/25/17 11:10 AM  Result Value Ref Range   Specimen Description BLOOD RIGHT HAND    Special Requests      BOTTLES DRAWN AEROBIC AND ANAEROBIC Blood Culture adequate volume   Culture      NO GROWTH 2 DAYS Performed at [facility], [address], [place], [place] [address]    Report Status PENDING   Brain natriuretic peptide     Status: Abnormal   Collection Time: 08/25/17 11:18 AM  Result Value Ref Range   B Natriuretic Peptide 605.0 (H) 0.0 - 100.0 pg/mL    Comment: Performed at [facility], [address] [place], [place] [address]  Culture, blood (Routine X 2) w Reflex to ID Panel     Status: None (Preliminary result)   Collection Time: 08/25/17 11:24 AM  Result Value Ref Range   Specimen Description BLOOD LEFT HAND     Special Requests      BOTTLES DRAWN AEROBIC AND ANAEROBIC Blood Culture adequate volume   Culture      NO GROWTH 2 DAYS Performed at [facility], [address], [place], [place] [address]    Report Status PENDING   POC occult blood, ED     Status: None   Collection Time: 08/25/17 11:30 AM  Result Value Ref Range   Fecal Occult Bld NEGATIVE NEGATIVE  Blood gas, arterial ([facility] & [facility] ONLY)     Status: Abnormal   Collection Time: 08/25/17 11:40 AM  Result Value Ref Range   FIO2 100.00    Delivery systems VENTILATOR    Mode BILEVEL POSITIVE AIRWAY PRESSURE    LHR 10 resp/min   Inspiratory PAP 18    Expiratory PAP 6    pH, Arterial 7.415 7.350 - 7.450   pCO2 arterial 38.6 32.0 - 48.0 mmHg   pO2, Arterial 72.2 (L) 83.0 - 108.0 mmHg   Bicarbonate 24.7 20.0 - 28.0 mmol/L   Acid-Base Excess 0.3 0.0 - 2.0 mmol/L   O2 Saturation 93.5 %   Collection site LEFT RADIAL    Drawn by [id number]    Sample type ARTERIAL    Allens test (pass/fail) PASS PASS    Comment: Performed at [facility], [address], [place],  [address]  Urinalysis, Routine w reflex microscopic     Status: Abnormal  Collection Time: 08/25/17 12:02 PM  Result Value Ref Range   Color, Urine AMBER (A) YELLOW    Comment: BIOCHEMICALS MAY BE AFFECTED BY COLOR   APPearance TURBID (A) CLEAR   Specific Gravity, Urine 1.016 1.005 - 1.030   pH 5.0 5.0 - 8.0   Glucose, UA NEGATIVE NEGATIVE mg/dL   Hgb urine dipstick SMALL (A) NEGATIVE   Bilirubin Urine NEGATIVE NEGATIVE   Ketones, ur NEGATIVE NEGATIVE mg/dL   Protein, ur 30 (A) NEGATIVE mg/dL   Nitrite NEGATIVE NEGATIVE   Leukocytes, UA TRACE (A) NEGATIVE   RBC / HPF 6-30 0 - 5 RBC/hpf   WBC, UA 6-30 0 - 5 WBC/hpf   Bacteria, UA RARE (A) NONE SEEN   Squamous Epithelial / LPF 0-5 (A) NONE SEEN   Mucus PRESENT    Non Squamous Epithelial 0-5 (A) NONE SEEN    Comment: Performed at [facility], [address], [place], [place] [address]  D-dimer, quantitative (not at  [facility])     Status: Abnormal   Collection Time: 08/25/17 12:10 PM  Result Value Ref Range   D-Dimer, Quant 4.88 (H) 0.00 - 0.50 ug/mL-FEU    Comment: (NOTE) At the manufacturer cut-off of 0.50 ug/mL FEU, this assay has been documented to exclude PE with a sensitivity and negative predictive value of 97 to 99%.  At this time, this assay has not been approved by the FDA to exclude DVT/VTE. Results should be correlated with clinical presentation. Performed at [facility], [address], [place], [place] [address]   Blood gas, arterial ([facility] ONLY)     Status: Abnormal   Collection Time: 08/25/17  1:38 PM  Result Value Ref Range   FIO2 80.00    Delivery systems VENTILATOR    Mode BILEVEL POSITIVE AIRWAY PRESSURE    LHR 10 resp/min   Inspiratory PAP 18    Expiratory PAP 6    pH, Arterial 7.434 7.350 - 7.450   pCO2 arterial 36.9 32.0 - 48.0 mmHg   pO2, Arterial 55.3 (L) 83.0 - 108.0 mmHg   Bicarbonate 24.9 20.0 - 28.0 mmol/L   Acid-Base Excess 0.6 0.0 - 2.0 mmol/L   O2 Saturation 88.3 %   Collection site RIGHT RADIAL    Drawn by [id number]    Sample type ARTERIAL    Allens test (pass/fail) PASS PASS    Comment: Performed at [facility], [address] [place], [place] [address]  Influenza panel by PCR (type A & B)     Status: None   Collection Time: 08/25/17  2:02 PM  Result Value Ref Range   Influenza A By PCR NEGATIVE NEGATIVE   Influenza B By PCR NEGATIVE NEGATIVE    Comment: (NOTE) The Xpert Xpress Flu assay is intended as an aid in the diagnosis of  influenza and should not be used as a sole basis for treatment.  This  assay is FDA approved for nasopharyngeal swab specimens only. Nasal  washings and aspirates are unacceptable for Xpert Xpress Flu testing. Performed at [facility], [address] [place], [place] [address]   MRSA PCR Screening     Status: None   Collection Time: 08/25/17  5:20 PM  Result Value Ref Range   MRSA by PCR NEGATIVE NEGATIVE    Comment:         The GeneXpert MRSA Assay (FDA approved for NASAL specimens only), is one component of a comprehensive MRSA colonization surveillance program. It is not intended to diagnose MRSA  infection nor to guide or monitor treatment for MRSA infections. Performed at [facility], [address], [place], [place] [address]   Comprehensive metabolic panel     Status: Abnormal   Collection Time: 08/26/17  4:11 AM  Result Value Ref Range   Sodium 146 (H) 135 - 145 mmol/L   Potassium 3.1 (L) 3.5 - 5.1 mmol/L   Chloride 109 101 - 111 mmol/L   CO2 27 22 - 32 mmol/L   Glucose, Bld 123 (H) 65 - 99 mg/dL   BUN 17 6 - 20 mg/dL   Creatinine, Ser 0.75 0.44 - 1.00 mg/dL   Calcium 7.5 (L) 8.9 - 10.3 mg/dL   Total Protein 5.4 (L) 6.5 - 8.1 g/dL   Albumin 2.0 (L) 3.5 - 5.0 g/dL   AST 33 15 - 41 U/L   ALT 48 14 - 54 U/L   Alkaline Phosphatase 155 (H) 38 - 126 U/L   Total Bilirubin 0.9 0.3 - 1.2 mg/dL   GFR calc non Af Amer >60 >60 mL/min   GFR calc Af Amer >60 >60 mL/min    Comment: (NOTE) The eGFR has been calculated using the CKD EPI equation. This calculation has not been validated in all clinical situations. eGFR's persistently <60 mL/min signify possible Chronic Kidney Disease.    Anion gap 10 5 - 15    Comment: Performed at [facility], [address], [place], [place] [address]  Magnesium     Status: None   Collection Time: 08/26/17  4:11 AM  Result Value Ref Range   Magnesium 1.8 1.7 - 2.4 mg/dL    Comment: Performed at [facility], [address], [place], [place] [address]  CBC with Differential/Platelet     Status: Abnormal   Collection Time: 08/26/17  4:11 AM  Result Value Ref Range   WBC 8.8 4.0 - 10.5 K/uL   RBC 3.47 (L) 3.87 - 5.11 MIL/uL   Hemoglobin 10.2 (L) 12.0 - 15.0 g/dL   HCT 30.1 (L) 36.0 - 46.0 %   MCV 86.7 78.0 - 100.0 fL   MCH 29.4 26.0 - 34.0 pg   MCHC 33.9 30.0 - 36.0 g/dL   RDW 15.8 (H) 11.5 - 15.5 %   Platelets 162 150 - 400 K/uL   Neutrophils Relative % 77 %    Neutro Abs 6.7 1.7 - 7.7 K/uL   Lymphocytes Relative 15 %   Lymphs Abs 1.3 0.7 - 4.0 K/uL   Monocytes Relative 8 %   Monocytes Absolute 0.7 0.1 - 1.0 K/uL   Eosinophils Relative 0 %   Eosinophils Absolute 0.0 0.0 - 0.7 K/uL   Basophils Relative 0 %   Basophils Absolute 0.0 0.0 - 0.1 K/uL    Comment: Performed at [facility], [address] [place], [place] [address]  Brain natriuretic peptide     Status: Abnormal   Collection Time: 08/26/17  4:12 AM  Result Value Ref Range   B Natriuretic Peptide 139.0 (H) 0.0 - 100.0 pg/mL    Comment: Performed at [facility], [address] [place], [place] [address]  Blood gas, arterial     Status: Abnormal   Collection Time: 08/26/17  6:35 AM  Result Value Ref Range   FIO2 60.00    Delivery systems BILEVEL POSITIVE AIRWAY PRESSURE    Inspiratory PAP 14    Expiratory PAP 7.0    pH, Arterial 7.454 (H) 7.350 - 7.450   pCO2 arterial 42.8 32.0 - 48.0 mmHg   pO2,  Arterial 61.3 (L) 83.0 - 108.0 mmHg   Bicarbonate 29.3 (H) 20.0 - 28.0 mmol/L   Acid-Base Excess 5.6 (H) 0.0 - 2.0 mmol/L   O2 Saturation 90.5 %   Patient temperature 37.0    Collection site RIGHT RADIAL    Drawn by [phone]    Sample type ARTERIAL DRAW    Allens test (pass/fail) PASS PASS    Comment: Performed at [facility], [address] [place], [place] [address]  Comprehensive metabolic panel     Status: Abnormal   Collection Time: 08/27/17  4:36 AM  Result Value Ref Range   Sodium 141 135 - 145 mmol/L   Potassium 3.3 (L) 3.5 - 5.1 mmol/L   Chloride 105 101 - 111 mmol/L   CO2 27 22 - 32 mmol/L   Glucose, Bld 108 (H) 65 - 99 mg/dL   BUN 14 6 - 20 mg/dL   Creatinine, Ser 0.62 0.44 - 1.00 mg/dL   Calcium 7.3 (L) 8.9 - 10.3 mg/dL   Total Protein 5.1 (L) 6.5 - 8.1 g/dL   Albumin 2.0 (L) 3.5 - 5.0 g/dL   AST 20 15 - 41 U/L   ALT 37 14 - 54 U/L   Alkaline Phosphatase 152 (H) 38 - 126 U/L   Total Bilirubin 0.6 0.3 - 1.2 mg/dL   GFR calc non Af Amer >60 >60 mL/min   GFR calc  Af Amer >60 >60 mL/min    Comment: (NOTE) The eGFR has been calculated using the CKD EPI equation. This calculation has not been validated in all clinical situations. eGFR's persistently <60 mL/min signify possible Chronic Kidney Disease.    Anion gap 9 5 - 15    Comment: Performed at [facility], [address] [place], [place] [address]  Magnesium     Status: None   Collection Time: 08/27/17  4:36 AM  Result Value Ref Range   Magnesium 2.0 1.7 - 2.4 mg/dL    Comment: Performed at [facility], [address] [place], [place] [address]    ABGS Recent Labs    08/26/17 0635  PHART 7.454*  PO2ART 61.3*  HCO3 29.3*   CULTURES Recent Results (from the past 240 hour(s))  Urine C&S     Status: Abnormal   Collection Time: 08/20/17  3:56 PM  Result Value Ref Range Status   Specimen Description   Final    URINE, CLEAN CATCH Performed at [facility], [address] [place], [place] [address]    Special Requests   Final    NONE Performed at [facility], [address], [place], [place] [address]    Culture >=100,000 COLONIES/mL ESCHERICHIA COLI (A)  Final   Report Status 08/22/2017 FINAL  Final   Organism ID, Bacteria ESCHERICHIA COLI (A)  Final      Susceptibility   Escherichia coli - MIC*    AMPICILLIN >=32 RESISTANT Resistant     CEFAZOLIN <=4 SENSITIVE Sensitive     CEFTRIAXONE <=1 SENSITIVE Sensitive     CIPROFLOXACIN <=0.25 SENSITIVE Sensitive     GENTAMICIN <=1 SENSITIVE Sensitive     IMIPENEM <=0.25 SENSITIVE Sensitive     NITROFURANTOIN <=16 SENSITIVE Sensitive     TRIMETH/SULFA >=320 RESISTANT Resistant     AMPICILLIN/SULBACTAM 16 INTERMEDIATE Intermediate     PIP/TAZO <=4 SENSITIVE Sensitive     Extended ESBL NEGATIVE Sensitive     * >=100,000 COLONIES/mL ESCHERICHIA COLI  Urine culture     Status: Abnormal   Collection Time: 08/22/17 11:26  AM  Result Value Ref Range Status   Specimen Description   Final    URINE, RANDOM Performed at [facility],  [address], [place], [place] [address]    Special Requests   Final    NONE Performed at [facility], [address], [place], [place] [address]    Culture (A)  Final    2,000 COLONIES/mL GROUP B STREP(S.AGALACTIAE)ISOLATED WITH MIXED BACTERIAL ORGANISMS TESTING AGAINST S. AGALACTIAE NOT ROUTINELY PERFORMED DUE TO PREDICTABILITY OF AMP/PEN/VAN SUSCEPTIBILITY. Performed at [facility], [place] [address], [place], [place] [address]    Report Status 08/25/2017 FINAL  Final  Culture, blood (routine x 2)     Status: None   Collection Time: 08/22/17  8:56 PM  Result Value Ref Range Status   Specimen Description BLOOD LEFT ARM  Final   Special Requests   Final    BOTTLES DRAWN AEROBIC AND ANAEROBIC Blood Culture adequate volume   Culture   Final    NO GROWTH 5 DAYS Performed at [facility], [address], [place], [place] [address]    Report Status 08/27/2017 FINAL  Final  Culture, blood (routine x 2)     Status: None   Collection Time: 08/22/17  9:01 PM  Result Value Ref Range Status   Specimen Description BLOOD LEFT HAND  Final   Special Requests   Final    BOTTLES DRAWN AEROBIC AND ANAEROBIC Blood Culture adequate volume   Culture   Final    NO GROWTH 5 DAYS Performed at [facility], [address] [place], [place] [address]    Report Status 08/27/2017 FINAL  Final  Culture, blood (Routine X 2) w Reflex to ID Panel     Status: None (Preliminary result)   Collection Time: 08/25/17 11:10 AM  Result Value Ref Range Status   Specimen Description BLOOD RIGHT HAND  Final   Special Requests   Final    BOTTLES DRAWN AEROBIC AND ANAEROBIC Blood Culture adequate volume   Culture   Final    NO GROWTH 2 DAYS Performed at [facility], [address] [place], [place] [address]    Report Status PENDING  Incomplete  Culture, blood (Routine X 2) w Reflex to ID Panel     Status: None (Preliminary result)   Collection Time: 08/25/17 11:24 AM  Result Value Ref Range Status   Specimen  Description BLOOD LEFT HAND  Final   Special Requests   Final    BOTTLES DRAWN AEROBIC AND ANAEROBIC Blood Culture adequate volume   Culture   Final    NO GROWTH 2 DAYS Performed at [facility], [address], [place], [place] [address]    Report Status PENDING  Incomplete  MRSA PCR Screening     Status: None   Collection Time: 08/25/17  5:20 PM  Result Value Ref Range Status   MRSA by PCR NEGATIVE NEGATIVE Final    Comment:        The GeneXpert MRSA Assay (FDA approved for NASAL specimens only), is one component of a comprehensive MRSA colonization surveillance program. It is not intended to diagnose MRSA infection nor to guide or monitor treatment for MRSA infections. Performed at [facility], [address], [place],  [address]    Studies/Results: Ct Angio Chest Pe W Or Wo Contrast  Result Date: 08/25/2017 CLINICAL DATA:  Evaluate for pulmonary embolus. High pretest probability. Shortness of breath. EXAM: CT ANGIOGRAPHY CHEST WITH CONTRAST TECHNIQUE: Multidetector CT imaging of the chest  was performed using the standard protocol during bolus administration of intravenous contrast. Multiplanar CT image reconstructions and MIPs were obtained to evaluate the vascular anatomy. CONTRAST:  25m ISOVUE-370 IOPAMIDOL (ISOVUE-370) INJECTION 76% COMPARISON:  None FINDINGS: Cardiovascular: The heart size appears normal. No pericardial effusion. The main pulmonary artery is patent. No saddle embolus or central obstructing embolus identified. No lobar or segmental pulmonary artery filling defects identified. Mediastinum/Nodes: Normal appearance of the thyroid gland. The trachea appears patent and is midline. Normal appearance of the esophagus. No mediastinal or hilar adenopathy. No axillary or supraclavicular adenopathy. Lungs/Pleura: There are moderate bilateral pleural effusions. Extensive bilateral multifocal airspace consolidation involving the upper and lower lobes with surrounding  areas of ground-glass attenuation. Upper Abdomen: No acute abnormality. Musculoskeletal: No chest wall abnormality. No acute or significant osseous findings. Review of the MIP images confirms the above findings. IMPRESSION: 1. No evidence for acute pulmonary emboli. 2. Since 08/22/2017 there has been interval development of extensive bilateral multi lobar airspace consolidation with surrounding ground-glass attenuation and moderate bilateral pleural effusions. Differential considerations are broad including but not limited to multi lobar pneumonia, drug toxicity, or aspiration. Electronically Signed   By: TKerby MoorsM.D.   On: 08/25/2017 16:32   Dg Chest Port 1 View  Result Date: 08/26/2017 CLINICAL DATA:  Multifocal pneumonia EXAM: PORTABLE CHEST 1 VIEW COMPARISON:  08/25/2017 FINDINGS: Severe bilateral airspace disease left greater than right is stable. No effusion. Heart size upper normal. IMPRESSION: Severe bilateral airspace disease unchanged.  No new findings. Electronically Signed   By: CFranchot GalloM.D.   On: 08/26/2017 08:30    Medications:  Prior to Admission:  Medications Prior to Admission  Medication Sig Dispense Refill Last Dose  . acetaminophen (TYLENOL) 500 MG tablet Take 1,000 mg by mouth every 6 (six) hours as needed.   Past Week at Unknown time  . amphetamine-dextroamphetamine (ADDERALL) 20 MG tablet Take 1 tablet by mouth 2 (two) times daily.   Past Week at Unknown time  . benzonatate (TESSALON) 100 MG capsule Take 1-2 capsules (100-200 mg total) by mouth 3 (three) times daily as needed for cough. 25 capsule 0 Past Week at Unknown time  . ciprofloxacin (CIPRO) 500 MG tablet Take 1 tablet (500 mg total) by mouth 2 (two) times daily for 12 days. 24 tablet 0 Past Week at Unknown time  . clonazePAM (KLONOPIN) 1 MG tablet Take 1 mg by mouth 3 (three) times daily.   Past Week at Unknown time  . escitalopram (LEXAPRO) 20 MG tablet Take 20 mg by mouth daily.   Past Week at Unknown  time  . HYDROcodone-acetaminophen (NORCO/VICODIN) 5-325 MG tablet Take 1 tablet by mouth every 6 (six) hours as needed for severe pain. 15 tablet 0 Past Week at Unknown time   Scheduled: . enoxaparin (LOVENOX) injection  40 mg Subcutaneous Q24H  . escitalopram  20 mg Oral Daily  . guaiFENesin  1,200 mg Oral BID  . mouth rinse  15 mL Mouth Rinse BID  . pantoprazole (PROTONIX) IV  40 mg Intravenous Q24H  . sodium chloride flush  3 mL Intravenous Q12H   Continuous: . sodium chloride    . ampicillin-sulbactam (UNASYN) IV 3 g (08/27/17 0537)  . vancomycin 1,000 mg (08/27/17 0919)   PLNL:GXQJJHchloride, acetaminophen, ketorolac, LORazepam, ondansetron (ZOFRAN) IV, sodium chloride flush  Assesment: She was admitted with acute hypoxic respiratory failure.  It appears that she aspirated.  She may have had an opioid overdose  She has asthma  at baseline and that is being treated  She has had acute heart failure and that is better  She had obstructive sleep apnea at baseline she is not using CPAP at home but has been on BiPAP here. Principal Problem:   Acute respiratory failure with hypoxia (HCC) Active Problems:   Asthma   Sleep apnea   Pyelonephritis   Opioid overdose (HCC)   Elevated liver enzymes   Acute heart failure (HCC)   Elevated brain natriuretic peptide (BNP) level   E. coli UTI   Multifocal pneumonia   Aspiration pneumonia ([place])    Plan: Add flutter valve and Mucinex.    LOS: 2 days   Akaiya Touchette L 08/27/2017, 10:45 AM

## 2017-08-27 NOTE — Progress Notes (Signed)
PROGRESS NOTE    Brooke Espinoza  ZOX:[id number]  DOB: 01-Aug-1985  DOA: 08/25/2017 PCP: Rebecka Apley, NP  Brief Admission Hx: Brooke Espinoza is a 32 y.o. female with history of asthma, OSA, chronic nicotine dependence, recently discharged yesterday from hospitalization for right pyelonephritis.  The patient had been refusing Lovenox injections in the hospital.  She apparently was found down at a friend's home and was basically unresponsive and cyanotic.  MDM/Assessment & Plan:   1. Acute respiratory failure with hypoxia-CTA negative for PE.  She does have multifocal pneumonia likely from aspiration while she was down.  I suspect opioid overdose as she responded to naloxone given in ED.  Continue bipap as needed.  Continue IV antibiotics.  Pulmonary consulted. Will ambulate patient today.  2. E. coli UTI with right pyelonephritis-resolving.  She is on IV unasyn for now for aspiration pneumonia which should cover the pyelonephritis.  3. Elevated liver enzymes-improved.  4. Acute heart failure-She was given a dose of IV lasix in ED and has diuresed well.   5. OSA-she is currently on BiPAP but will offer nightly CPAP when she has been weaned off the BiPAP. 6. Asthma-we will continue nebulizer treatments.  Continue supportive therapy with oxygen [place].  7. Hypokalemia - replacement ordered.  Magnesium IV ordered. 8. Hypernatremia - resolved after small fluid bolus of D5W given 2/16.   DVT Prophylaxis: Lovenox Code Status: Full Family Communication: Mother at bedside Disposition Plan: Home when medically stable  Consultants:  pulmonology  Procedures:  bipap  Subjective: Brooke Espinoza reports that she has some SOB but overall feeling better, did not have to go back on bipap, tolerating [place].   Objective: Vitals:   08/27/17 0600 08/27/17 0744 08/27/17 0800 08/27/17 0900  BP: 125/71     Pulse: 61 (!) 51 (!) 56 60  Resp: 16 (!) 21 14 (!) 27  Temp:  98.1 F (36.7 C)    TempSrc:  Oral     SpO2: 92% 97% 96% 94%  Weight:      Height:        Intake/Output Summary (Last 24 hours) at 08/27/2017 1041 Last data filed at 08/27/2017 0900 Gross per 24 hour  Intake 1340 ml  Output -  Net 1340 ml   Filed Weights   08/25/17 1640 08/26/17 0400 08/27/17 0500  Weight: 91.5 kg (201 lb 11.5 oz) 91.5 kg (201 lb 11.5 oz) 93.9 kg (207 lb 0.2 oz)     REVIEW OF SYSTEMS  As per history otherwise all reviewed and reported negative  Exam:  General exam: Brooke Espinoza off bipap on NS.  Awake, alert, NAD. Cooperative.  Respiratory system: shallow breathing bilateral with rales heard on left.  Cardiovascular system: S1 & S2 heard, RRR. No JVD, murmurs, gallops, clicks or pedal edema. Gastrointestinal system: Abdomen is nondistended, soft and nontender. Normal bowel sounds heard. Central nervous system: Alert and oriented. No focal neurological deficits. Extremities: no CCE.  Data Reviewed: Basic Metabolic Panel: Recent Labs  Lab 08/23/17 0352 08/24/17 0348 08/25/17 1026 08/26/17 0411 08/27/17 0436  NA 140 140 140 146* 141  K 3.6 4.1 3.5 3.1* 3.3*  CL 113* 112* 108 109 105  CO2 20* 20* 22 27 27   GLUCOSE 85 121* 153* 123* 108*  BUN 27* 20 15 17 14   CREATININE 1.47* 0.96 0.74 0.75 0.62  CALCIUM 7.1* 7.4* 7.7* 7.5* 7.3*  MG  --   --   --  1.8 2.0   Liver Function Tests: Recent Labs  Lab 08/23/17 0352 08/24/17 0348 08/25/17 1026 08/26/17 0411 08/27/17 0436  AST 25 26 93* 33 20  ALT 23 27 75* 48 37  ALKPHOS 98 112 174* 155* 152*  BILITOT 0.5 0.7 0.4 0.9 0.6  PROT 5.0* 5.2* 5.7* 5.4* 5.1*  ALBUMIN 2.1* 2.1* 2.1* 2.0* 2.0*   Recent Labs  Lab 08/22/17 1125  LIPASE 18   No results for input(s): AMMONIA in the last 168 hours. CBC: Recent Labs  Lab 08/22/17 1125 08/23/17 0352 08/24/17 0348 08/25/17 1026 08/26/17 0411  WBC 9.6 6.3 7.7 9.9 8.8  NEUTROABS 8.8  --  6.6 7.8 6.7  HGB 11.0* 10.4* 10.8* 11.4* 10.2*  HCT 33.9* 31.8* 32.9* 34.3* 30.1*  MCV 89.2 88.1 87.5 86.2  86.7  PLT 92* 123* 131* 162 162   Cardiac Enzymes: Recent Labs  Lab 08/22/17 2101 08/23/17 0352 08/23/17 0926 08/25/17 1026  TROPONINI 0.19* 0.18* 0.19* 0.15*   CBG (last 3)  No results for input(s): GLUCAP in the last 72 hours. Recent Results (from the past 240 hour(s))  Urine C&S     Status: Abnormal   Collection Time: 08/20/17  3:56 PM  Result Value Ref Range Status   Specimen Description   Final    URINE, CLEAN CATCH Performed at [facility], [address], [place], [place] [address]    Special Requests   Final    NONE Performed at [facility], [address] [place], [place] [address]    Culture >=100,000 COLONIES/mL ESCHERICHIA COLI (A)  Final   Report Status 08/22/2017 FINAL  Final   Organism ID, Bacteria ESCHERICHIA COLI (A)  Final      Susceptibility   Escherichia coli - MIC*    AMPICILLIN >=32 RESISTANT Resistant     CEFAZOLIN <=4 SENSITIVE Sensitive     CEFTRIAXONE <=1 SENSITIVE Sensitive     CIPROFLOXACIN <=0.25 SENSITIVE Sensitive     GENTAMICIN <=1 SENSITIVE Sensitive     IMIPENEM <=0.25 SENSITIVE Sensitive     NITROFURANTOIN <=16 SENSITIVE Sensitive     TRIMETH/SULFA >=320 RESISTANT Resistant     AMPICILLIN/SULBACTAM 16 INTERMEDIATE Intermediate     PIP/TAZO <=4 SENSITIVE Sensitive     Extended ESBL NEGATIVE Sensitive     * >=100,000 COLONIES/mL ESCHERICHIA COLI  Urine culture     Status: Abnormal   Collection Time: 08/22/17 11:26 AM  Result Value Ref Range Status   Specimen Description   Final    URINE, RANDOM Performed at [facility], [address], [place], [place] [address]    Special Requests   Final    NONE Performed at [facility], [address], [place], [place] [address]    Culture (A)  Final    2,000 COLONIES/mL GROUP B STREP(S.AGALACTIAE)ISOLATED WITH MIXED BACTERIAL ORGANISMS TESTING AGAINST S. AGALACTIAE NOT ROUTINELY PERFORMED DUE TO PREDICTABILITY OF AMP/PEN/VAN SUSCEPTIBILITY. Performed at [facility], [address], [place], [place] [address]    Report Status 08/25/2017 FINAL  Final  Culture, blood (routine x 2)     Status: None   Collection Time: 08/22/17  8:56 PM  Result Value Ref Range Status   Specimen Description BLOOD LEFT ARM  Final   Special Requests   Final    BOTTLES DRAWN AEROBIC AND ANAEROBIC Blood Culture adequate volume   Culture   Final    NO GROWTH 5 DAYS Performed at [facility], [address], [place], [place] [address]    Report Status 08/27/2017 FINAL  Final  Culture,  blood (routine x 2)     Status: None   Collection Time: 08/22/17  9:01 PM  Result Value Ref Range Status   Specimen Description BLOOD LEFT HAND  Final   Special Requests   Final    BOTTLES DRAWN AEROBIC AND ANAEROBIC Blood Culture adequate volume   Culture   Final    NO GROWTH 5 DAYS Performed at [facility], [address], [place], [place] [address]    Report Status 08/27/2017 FINAL  Final  Culture, blood (Routine X 2) w Reflex to ID Panel     Status: None (Preliminary result)   Collection Time: 08/25/17 11:10 AM  Result Value Ref Range Status   Specimen Description BLOOD RIGHT HAND  Final   Special Requests   Final    BOTTLES DRAWN AEROBIC AND ANAEROBIC Blood Culture adequate volume   Culture   Final    NO GROWTH 2 DAYS Performed at [facility], [address], [place], [place] [address]    Report Status PENDING  Incomplete  Culture, blood (Routine X 2) w Reflex to ID Panel     Status: None (Preliminary result)   Collection Time: 08/25/17 11:24 AM  Result Value Ref Range Status   Specimen Description BLOOD LEFT HAND  Final   Special Requests   Final    BOTTLES DRAWN AEROBIC AND ANAEROBIC Blood Culture adequate volume   Culture   Final    NO GROWTH 2 DAYS Performed at [facility], [address], [place], [place] [address]    Report Status PENDING  Incomplete  MRSA PCR Screening     Status: None   Collection Time: 08/25/17  5:20 PM  Result Value Ref Range Status   MRSA by PCR NEGATIVE  NEGATIVE Final    Comment:        The GeneXpert MRSA Assay (FDA approved for NASAL specimens only), is one component of a comprehensive MRSA colonization surveillance program. It is not intended to diagnose MRSA infection nor to guide or monitor treatment for MRSA infections. Performed at [facility], [address] [place], [place] [address]      Studies: Ct Angio Chest Pe W Or Wo Contrast  Result Date: 08/25/2017 CLINICAL DATA:  Evaluate for pulmonary embolus. High pretest probability. Shortness of breath. EXAM: CT ANGIOGRAPHY CHEST WITH CONTRAST TECHNIQUE: Multidetector CT imaging of the chest was performed using the standard protocol during bolus administration of intravenous contrast. Multiplanar CT image reconstructions and MIPs were obtained to evaluate the vascular anatomy. CONTRAST:  80mL ISOVUE-370 IOPAMIDOL (ISOVUE-370) INJECTION 76% COMPARISON:  None FINDINGS: Cardiovascular: The heart size appears normal. No pericardial effusion. The main pulmonary artery is patent. No saddle embolus or central obstructing embolus identified. No lobar or segmental pulmonary artery filling defects identified. Mediastinum/Nodes: Normal appearance of the thyroid gland. The trachea appears patent and is midline. Normal appearance of the esophagus. No mediastinal or hilar adenopathy. No axillary or supraclavicular adenopathy. Lungs/Pleura: There are moderate bilateral pleural effusions. Extensive bilateral multifocal airspace consolidation involving the upper and lower lobes with surrounding areas of ground-glass attenuation. Upper Abdomen: No acute abnormality. Musculoskeletal: No chest wall abnormality. No acute or significant osseous findings. Review of the MIP images confirms the above findings. IMPRESSION: 1. No evidence for acute pulmonary emboli. 2. Since 08/22/2017 there has been interval development of extensive bilateral multi lobar airspace consolidation with surrounding ground-glass attenuation  and moderate bilateral pleural effusions. Differential considerations are broad including but not limited to multi lobar pneumonia, drug toxicity, or  aspiration. Electronically Signed   By: Signa Kellaylor  Stroud M.D.   On: 08/25/2017 16:32   Dg Chest Port 1 View  Result Date: 08/26/2017 CLINICAL DATA:  Multifocal pneumonia EXAM: PORTABLE CHEST 1 VIEW COMPARISON:  08/25/2017 FINDINGS: Severe bilateral airspace disease left greater than right is stable. No effusion. Heart size upper normal. IMPRESSION: Severe bilateral airspace disease unchanged.  No new findings. Electronically Signed   By: Marlan Palauharles  Clark M.D.   On: 08/26/2017 08:30   Scheduled Meds: . enoxaparin (LOVENOX) injection  40 mg Subcutaneous Q24H  . escitalopram  20 mg Oral Daily  . mouth rinse  15 mL Mouth Rinse BID  . pantoprazole (PROTONIX) IV  40 mg Intravenous Q24H  . sodium chloride flush  3 mL Intravenous Q12H   Continuous Infusions: . sodium chloride    . ampicillin-sulbactam (UNASYN) IV 3 g (08/27/17 0537)  . vancomycin 1,000 mg (08/27/17 0919)    Principal Problem:   Acute respiratory failure with hypoxia (HCC) Active Problems:   Asthma   Sleep apnea   Pyelonephritis   Opioid overdose (HCC)   Elevated liver enzymes   Acute heart failure (HCC)   Elevated brain natriuretic peptide (BNP) level   E. coli UTI   Multifocal pneumonia   Aspiration pneumonia [facility](HCC)  Critical Care Time spent: 30 mins  Standley Dakinslanford Johnson, MD, FAAFP [facility] Pager [phone]  If 7PM-7AM, please contact night-coverage [email or web address] Password [email or web address] 08/27/2017, 10:41 AM    LOS: 2 days

## 2017-08-28 DIAGNOSIS — J9601 Acute respiratory failure with hypoxia: Secondary | ICD-10-CM

## 2017-08-28 LAB — COMPREHENSIVE METABOLIC PANEL
ALT: 31 U/L (ref 14–54)
AST: 17 U/L (ref 15–41)
Albumin: 2.1 g/dL — ABNORMAL LOW (ref 3.5–5.0)
Alkaline Phosphatase: 138 U/L — ABNORMAL HIGH (ref 38–126)
Anion gap: 11 (ref 5–15)
BILIRUBIN TOTAL: 0.6 mg/dL (ref 0.3–1.2)
BUN: 11 mg/dL (ref 6–20)
CHLORIDE: 105 mmol/L (ref 101–111)
CO2: 27 mmol/L (ref 22–32)
CREATININE: 0.54 mg/dL (ref 0.44–1.00)
Calcium: 7.9 mg/dL — ABNORMAL LOW (ref 8.9–10.3)
GFR calc Af Amer: >60 mL/min (ref >60)
GFR calc non Af Amer: >60 mL/min (ref >60)
Glucose, Bld: 89 mg/dL (ref 65–99)
Potassium: 3.6 mmol/L (ref 3.5–5.1)
Sodium: 143 mmol/L (ref 135–145)
Total Protein: 5.6 g/dL — ABNORMAL LOW (ref 6.5–8.1)

## 2017-08-28 LAB — CBC WITH DIFFERENTIAL/PLATELET
Basophils Absolute: 0 10*3/uL (ref 0.0–0.1)
Basophils Relative: 0 %
EOS PCT: 1 %
Eosinophils Absolute: 0.1 10*3/uL (ref 0.0–0.7)
HCT: 30.7 % — ABNORMAL LOW (ref 36.0–46.0)
Hemoglobin: 10 g/dL — ABNORMAL LOW (ref 12.0–15.0)
LYMPHS ABS: 1.7 10*3/uL (ref 0.7–4.0)
LYMPHS PCT: 18 %
MCH: 28.6 pg (ref 26.0–34.0)
MCHC: 32.6 g/dL (ref 30.0–36.0)
MCV: 87.7 fL (ref 78.0–100.0)
MONO ABS: 0.5 10*3/uL (ref 0.1–1.0)
MONOS PCT: 6 %
Neutro Abs: 7 10*3/uL (ref 1.7–7.7)
Neutrophils Relative %: 75 %
PLATELETS: 230 10*3/uL (ref 150–400)
RBC: 3.5 MIL/uL — ABNORMAL LOW (ref 3.87–5.11)
RDW: 15.5 % (ref 11.5–15.5)
WBC: 9.3 10*3/uL (ref 4.0–10.5)

## 2017-08-28 LAB — MAGNESIUM: MAGNESIUM: 1.7 mg/dL (ref 1.7–2.4)

## 2017-08-28 LAB — VANCOMYCIN, TROUGH: Vancomycin Tr: <4 ug/mL — ABNORMAL LOW (ref 15–20)

## 2017-08-28 NOTE — Progress Notes (Signed)
Subjective: She was admitted with acute respiratory failure and aspiration pneumonia.  She required BiPAP initially but did not require BiPAP last night.  She is now on 2 L nasal cannula.  Objective: Vital signs in last 24 hours: Temp:  [98.4 F (36.9 C)-98.9 F (37.2 C)] 98.8 F (37.1 C) (02/18 0507) Pulse Rate:  [56-60] 56 (02/18 0507) Resp:  [18-27] 20 (02/18 0507) BP: (125-131)/(77-96) 125/90 (02/18 0507) SpO2:  [94 %-100 %] 97 % (02/18 0507) Weight:  [92.1 kg (203 lb 1.6 oz)] 92.1 kg (203 lb 1.6 oz) (02/18 0507) Weight change: -1.774 kg (-14.6 oz) Last BM Date: 08/27/17  Intake/Output from previous day: 02/17 0701 - 02/18 0700 In: 1560 [P.O.:960; IV Piggyback:600] Out: -   PHYSICAL EXAM General appearance: alert, cooperative and no distress Resp: rhonchi bilaterally Cardio: regular rate and rhythm, S1, S2 normal, no murmur, click, rub or gallop GI: soft, non-tender; bowel sounds normal; no masses,  no organomegaly Extremities: extremities normal, atraumatic, no cyanosis or edema Skin warm and dry  Lab Results:  Results for orders placed or performed during the hospital encounter of 08/25/17 (from the past 48 hour(s))  Comprehensive metabolic panel     Status: Abnormal   Collection Time: 08/27/17  4:36 AM  Result Value Ref Range   Sodium 141 135 - 145 mmol/L   Potassium 3.3 (L) 3.5 - 5.1 mmol/L   Chloride 105 101 - 111 mmol/L   CO2 27 22 - 32 mmol/L   Glucose, Bld 108 (H) 65 - 99 mg/dL   BUN 14 6 - 20 mg/dL   Creatinine, Ser 0.62 0.44 - 1.00 mg/dL   Calcium 7.3 (L) 8.9 - 10.3 mg/dL   Total Protein 5.1 (L) 6.5 - 8.1 g/dL   Albumin 2.0 (L) 3.5 - 5.0 g/dL   AST 20 15 - 41 U/L   ALT 37 14 - 54 U/L   Alkaline Phosphatase 152 (H) 38 - 126 U/L   Total Bilirubin 0.6 0.3 - 1.2 mg/dL   GFR calc non Af Amer >60 >60 mL/min   GFR calc Af Amer >60 >60 mL/min    Comment: (NOTE) The eGFR has been calculated using the CKD EPI equation. This calculation has not been validated  in all clinical situations. eGFR's persistently <60 mL/min signify possible Chronic Kidney Disease.    Anion gap 9 5 - 15    Comment: Performed at [facility], [address] [place], [place] [address]  Magnesium     Status: None   Collection Time: 08/27/17  4:36 AM  Result Value Ref Range   Magnesium 2.0 1.7 - 2.4 mg/dL    Comment: Performed at [facility], [address] [place], [place] [address]    ABGS Recent Labs    08/26/17 0635  PHART 7.454*  PO2ART 61.3*  HCO3 29.3*   CULTURES Recent Results (from the past 240 hour(s))  Urine C&S     Status: Abnormal   Collection Time: 08/20/17  3:56 PM  Result Value Ref Range Status   Specimen Description   Final    URINE, CLEAN CATCH Performed at [facility], [address] [place], [place] [address]    Special Requests   Final    NONE Performed at [facility], [address] [place], [place] [address]    Culture >=100,000 COLONIES/mL ESCHERICHIA COLI (A)  Final   Report Status 08/22/2017 FINAL  Final   Organism ID, Bacteria ESCHERICHIA COLI (A)  Final      Susceptibility  Escherichia coli - MIC*    AMPICILLIN >=32 RESISTANT Resistant     CEFAZOLIN <=4 SENSITIVE Sensitive     CEFTRIAXONE <=1 SENSITIVE Sensitive     CIPROFLOXACIN <=0.25 SENSITIVE Sensitive     GENTAMICIN <=1 SENSITIVE Sensitive     IMIPENEM <=0.25 SENSITIVE Sensitive     NITROFURANTOIN <=16 SENSITIVE Sensitive     TRIMETH/SULFA >=320 RESISTANT Resistant     AMPICILLIN/SULBACTAM 16 INTERMEDIATE Intermediate     PIP/TAZO <=4 SENSITIVE Sensitive     Extended ESBL NEGATIVE Sensitive     * >=100,000 COLONIES/mL ESCHERICHIA COLI  Urine culture     Status: Abnormal   Collection Time: 08/22/17 11:26 AM  Result Value Ref Range Status   Specimen Description   Final    URINE, RANDOM Performed at [facility], [address], [place], [place] [address]    Special Requests   Final    NONE Performed at [facility], [address], [place], [place]  [address]    Culture (A)  Final    2,000 COLONIES/mL GROUP B STREP(S.AGALACTIAE)ISOLATED WITH MIXED BACTERIAL ORGANISMS TESTING AGAINST S. AGALACTIAE NOT ROUTINELY PERFORMED DUE TO PREDICTABILITY OF AMP/PEN/VAN SUSCEPTIBILITY. Performed at [facility], [address], [place], [place] [address]    Report Status 08/25/2017 FINAL  Final  Culture, blood (routine x 2)     Status: None   Collection Time: 08/22/17  8:56 PM  Result Value Ref Range Status   Specimen Description BLOOD LEFT ARM  Final   Special Requests   Final    BOTTLES DRAWN AEROBIC AND ANAEROBIC Blood Culture adequate volume   Culture   Final    NO GROWTH 5 DAYS Performed at [facility], [address], [place], [place] [address]    Report Status 08/27/2017 FINAL  Final  Culture, blood (routine x 2)     Status: None   Collection Time: 08/22/17  9:01 PM  Result Value Ref Range Status   Specimen Description BLOOD LEFT HAND  Final   Special Requests   Final    BOTTLES DRAWN AEROBIC AND ANAEROBIC Blood Culture adequate volume   Culture   Final    NO GROWTH 5 DAYS Performed at [facility], [address] [place], [place] [address]    Report Status 08/27/2017 FINAL  Final  Culture, blood (Routine X 2) w Reflex to ID Panel     Status: None (Preliminary result)   Collection Time: 08/25/17 11:10 AM  Result Value Ref Range Status   Specimen Description BLOOD RIGHT HAND  Final   Special Requests   Final    BOTTLES DRAWN AEROBIC AND ANAEROBIC Blood Culture adequate volume   Culture   Final    NO GROWTH 2 DAYS Performed at [facility], [address], [place], [place] [address]    Report Status PENDING  Incomplete  Culture, blood (Routine X 2) w Reflex to ID Panel     Status: None (Preliminary result)   Collection Time: 08/25/17 11:24 AM  Result Value Ref Range Status   Specimen Description BLOOD LEFT HAND  Final   Special Requests   Final    BOTTLES DRAWN AEROBIC AND ANAEROBIC Blood Culture adequate volume   Culture    Final    NO GROWTH 2 DAYS Performed at [facility], [address] [place], [place] [address]    Report Status PENDING  Incomplete  MRSA PCR Screening     Status: None   Collection Time: 08/25/17  5:20 PM  Result Value Ref Range Status   MRSA by PCR NEGATIVE NEGATIVE Final    Comment:        The GeneXpert MRSA Assay (FDA approved for NASAL specimens only), is one component of a comprehensive MRSA colonization surveillance program. It is not intended to diagnose MRSA infection nor to guide or monitor treatment for MRSA infections. Performed at [facility], [address], [place],  [address]    Studies/Results: No results found.  Medications:  Prior to Admission:  Medications Prior to Admission  Medication Sig Dispense Refill Last Dose  . acetaminophen (TYLENOL) 500 MG tablet Take 1,000 mg by mouth every 6 (six) hours as needed.   Past Week at Unknown time  . amphetamine-dextroamphetamine (ADDERALL) 20 MG tablet Take 1 tablet by mouth 2 (two) times daily.   Past Week at Unknown time  . benzonatate (TESSALON) 100 MG capsule Take 1-2 capsules (100-200 mg total) by mouth 3 (three) times daily as needed for cough. 25 capsule 0 Past Week at Unknown time  . ciprofloxacin (CIPRO) 500 MG tablet Take 1 tablet (500 mg total) by mouth 2 (two) times daily for 12 days. 24 tablet 0 Past Week at Unknown time  . clonazePAM (KLONOPIN) 1 MG tablet Take 1 mg by mouth 3 (three) times daily.   Past Week at Unknown time  . escitalopram (LEXAPRO) 20 MG tablet Take 20 mg by mouth daily.   Past Week at Unknown time  . HYDROcodone-acetaminophen (NORCO/VICODIN) 5-325 MG tablet Take 1 tablet by mouth every 6 (six) hours as needed for severe pain. 15 tablet 0 Past Week at Unknown time   Scheduled: . enoxaparin (LOVENOX) injection  40 mg Subcutaneous Q24H  . escitalopram  20 mg Oral Daily  . guaiFENesin  1,200 mg Oral BID  . mouth rinse  15 mL Mouth Rinse BID  . pantoprazole (PROTONIX) IV  40 mg  Intravenous Q24H  . sodium chloride flush  3 mL Intravenous Q12H   Continuous: . sodium chloride    . ampicillin-sulbactam (UNASYN) IV Stopped (08/28/17 7741)   OIN:OMVEHM chloride, acetaminophen, ketorolac, LORazepam, ondansetron (ZOFRAN) IV, sodium chloride flush  Assesment: She was admitted with acute hypoxic respiratory failure.  She is felt to have had an opioid overdose.  She had multifocal pneumonia presumably from aspiration.  She had pyelonephritis previously.  She has sleep apnea at baseline.  She is doing better.  She is not requiring BiPAP.  She is able to cough up some sputum. Principal Problem:   Acute respiratory failure with hypoxia (HCC) Active Problems:   Asthma   Sleep apnea   Pyelonephritis   Opioid overdose (HCC)   Elevated liver enzymes   Acute heart failure (HCC)   Elevated brain natriuretic peptide (BNP) level   E. coli UTI   Multifocal pneumonia   Aspiration pneumonia ([place])    Plan: Continue treatments    LOS: 3 days   Marcellene Shivley L 08/28/2017, 8:18 AM

## 2017-08-28 NOTE — Progress Notes (Signed)
PROGRESS NOTE    Brooke Espinoza  WUJ:[id number]  DOB: Aug 12, 1985  DOA: 08/25/2017 PCP: Rebecka Apley, NP  Brief Admission Hx: Brooke Espinoza is a 32 y.o. female with history of asthma, OSA, chronic nicotine dependence, recently discharged yesterday from hospitalization for right pyelonephritis.  The patient had been refusing Lovenox injections in the hospital.  She apparently was found down at a friend's home and was basically unresponsive and cyanotic.  MDM/Assessment & Plan:   1. Acute respiratory failure with hypoxia-CTA negative for PE.  She does have multifocal pneumonia likely from aspiration while she was down.  I suspect opioid overdose as she responded to naloxone given in ED.  She was initially treated with bipap and now on nasal cannula.  Continue IV antibiotics.  Pulmonary consulted. Will ambulate patient today. Hopefully discharge home tomorrow.  2. Aspiration pneumonia - clinically improving, continue current antibiotics, plan to switch to oral antibiotics tomorrow.  3. E. coli UTI with right pyelonephritis-being treated.  She is on IV unasyn for now for aspiration pneumonia which should cover the pyelonephritis.  4. Elevated liver enzymes-improved.  5. Acute heart failure-She was given a dose of IV lasix in ED and has diuresed well.   6. OSA-she has not been using CPAP at home.   7. Asthma-we will continue nebulizer treatments.  Continue supportive therapy with oxygen [place].  8. Hypokalemia - replacement ordered.  Magnesium IV ordered. 9. Hypernatremia - resolved after small fluid bolus of D5W given 2/16.   DVT Prophylaxis: Lovenox Code Status: Full Family Communication: Mother at bedside Disposition Plan: Home tomorrow  Consultants:  pulmonology  Procedures:  bipap  Subjective: Pt says that she is feeling better, she has been weaned down to nasal cannula.   Objective: Vitals:   08/27/17 0900 08/27/17 1500 08/27/17 2109 08/28/17 0507  BP:  (!) 130/96  131/77 125/90  Pulse: 60 (!) 56 (!) 57 (!) 56  Resp: (!) 27 18 18 20   Temp:  98.4 F (36.9 C) 98.9 F (37.2 C) 98.8 F (37.1 C)  TempSrc:  Oral Oral Oral  SpO2: 94% 99% 100% 97%  Weight:    92.1 kg (203 lb 1.6 oz)  Height:        Intake/Output Summary (Last 24 hours) at 08/28/2017 0951 Last data filed at 08/28/2017 9562 Gross per 24 hour  Intake 1120 ml  Output -  Net 1120 ml   Filed Weights   08/26/17 0400 08/27/17 0500 08/28/17 0507  Weight: 91.5 kg (201 lb 11.5 oz) 93.9 kg (207 lb 0.2 oz) 92.1 kg (203 lb 1.6 oz)   REVIEW OF SYSTEMS  As per history otherwise all reviewed and reported negative  Exam:  General exam: on nasal cannula.  Awake, alert, NAD. Cooperative.  Respiratory system: bbs with scattered rales.  Cardiovascular system: S1 & S2 heard, RRR. No JVD, murmurs, gallops, clicks or pedal edema. Gastrointestinal system: Abdomen is nondistended, soft and nontender. Normal bowel sounds heard. Central nervous system: Alert and oriented. No focal neurological deficits. Extremities: no CCE.  Data Reviewed: Basic Metabolic Panel: Recent Labs  Lab 08/24/17 0348 08/25/17 1026 08/26/17 0411 08/27/17 0436 08/28/17 0559  NA 140 140 146* 141 143  K 4.1 3.5 3.1* 3.3* 3.6  CL 112* 108 109 105 105  CO2 20* 22 27 27 27   GLUCOSE 121* 153* 123* 108* 89  BUN 20 15 17 14 11   CREATININE 0.96 0.74 0.75 0.62 0.54  CALCIUM 7.4* 7.7* 7.5* 7.3* 7.9*  MG  --   --  1.8 2.0 1.7   Liver Function Tests: Recent Labs  Lab 08/24/17 0348 08/25/17 1026 08/26/17 0411 08/27/17 0436 08/28/17 0559  AST 26 93* 33 20 17  ALT 27 75* 48 37 31  ALKPHOS 112 174* 155* 152* 138*  BILITOT 0.7 0.4 0.9 0.6 0.6  PROT 5.2* 5.7* 5.4* 5.1* 5.6*  ALBUMIN 2.1* 2.1* 2.0* 2.0* 2.1*   Recent Labs  Lab 08/22/17 1125  LIPASE 18   No results for input(s): AMMONIA in the last 168 hours. CBC: Recent Labs  Lab 08/22/17 1125 08/23/17 0352 08/24/17 0348 08/25/17 1026 08/26/17 0411  08/28/17 0559  WBC 9.6 6.3 7.7 9.9 8.8 9.3  NEUTROABS 8.8  --  6.6 7.8 6.7 7.0  HGB 11.0* 10.4* 10.8* 11.4* 10.2* 10.0*  HCT 33.9* 31.8* 32.9* 34.3* 30.1* 30.7*  MCV 89.2 88.1 87.5 86.2 86.7 87.7  PLT 92* 123* 131* 162 162 230   Cardiac Enzymes: Recent Labs  Lab 08/22/17 2101 08/23/17 0352 08/23/17 0926 08/25/17 1026  TROPONINI 0.19* 0.18* 0.19* 0.15*   CBG (last 3)  No results for input(s): GLUCAP in the last 72 hours. Recent Results (from the past 240 hour(s))  Urine C&S     Status: Abnormal   Collection Time: 08/20/17  3:56 PM  Result Value Ref Range Status   Specimen Description   Final    URINE, CLEAN CATCH Performed at [facility], [address], [place], [place] [address]    Special Requests   Final    NONE Performed at [facility], [address] [place], [place] [address]    Culture >=100,000 COLONIES/mL ESCHERICHIA COLI (A)  Final   Report Status 08/22/2017 FINAL  Final   Organism ID, Bacteria ESCHERICHIA COLI (A)  Final      Susceptibility   Escherichia coli - MIC*    AMPICILLIN >=32 RESISTANT Resistant     CEFAZOLIN <=4 SENSITIVE Sensitive     CEFTRIAXONE <=1 SENSITIVE Sensitive     CIPROFLOXACIN <=0.25 SENSITIVE Sensitive     GENTAMICIN <=1 SENSITIVE Sensitive     IMIPENEM <=0.25 SENSITIVE Sensitive     NITROFURANTOIN <=16 SENSITIVE Sensitive     TRIMETH/SULFA >=320 RESISTANT Resistant     AMPICILLIN/SULBACTAM 16 INTERMEDIATE Intermediate     PIP/TAZO <=4 SENSITIVE Sensitive     Extended ESBL NEGATIVE Sensitive     * >=100,000 COLONIES/mL ESCHERICHIA COLI  Urine culture     Status: Abnormal   Collection Time: 08/22/17 11:26 AM  Result Value Ref Range Status   Specimen Description   Final    URINE, RANDOM Performed at [facility], [address] [place], [place] [address]    Special Requests   Final    NONE Performed at [facility], [address], [place], [place] [address]    Culture (A)  Final    2,000 COLONIES/mL GROUP B  STREP(S.AGALACTIAE)ISOLATED WITH MIXED BACTERIAL ORGANISMS TESTING AGAINST S. AGALACTIAE NOT ROUTINELY PERFORMED DUE TO PREDICTABILITY OF AMP/PEN/VAN SUSCEPTIBILITY. Performed at [facility], [address] [place], [place] [address]    Report Status 08/25/2017 FINAL  Final  Culture, blood (routine x 2)     Status: None   Collection Time: 08/22/17  8:56 PM  Result Value Ref Range Status   Specimen Description BLOOD LEFT ARM  Final   Special Requests   Final    BOTTLES DRAWN AEROBIC AND ANAEROBIC Blood Culture adequate volume   Culture   Final    NO GROWTH 5 DAYS Performed at [facility], [address] [place], [place] [address]    Report Status 08/27/2017 FINAL  Final  Culture, blood (routine x 2)     Status: None   Collection Time: 08/22/17  9:01 PM  Result Value Ref Range Status   Specimen Description BLOOD LEFT HAND  Final   Special Requests   Final    BOTTLES DRAWN AEROBIC AND ANAEROBIC Blood Culture adequate volume   Culture   Final    NO GROWTH 5 DAYS Performed at [facility], [address] [place], [place] [address]    Report Status 08/27/2017 FINAL  Final  Culture, blood (Routine X 2) w Reflex to ID Panel     Status: None (Preliminary result)   Collection Time: 08/25/17 11:10 AM  Result Value Ref Range Status   Specimen Description BLOOD RIGHT HAND  Final   Special Requests   Final    BOTTLES DRAWN AEROBIC AND ANAEROBIC Blood Culture adequate volume   Culture   Final    NO GROWTH 3 DAYS Performed at [facility], [address] [place], [place] [address]    Report Status PENDING  Incomplete  Culture, blood (Routine X 2) w Reflex to ID Panel     Status: None (Preliminary result)   Collection Time: 08/25/17 11:24 AM  Result Value Ref Range Status   Specimen Description BLOOD LEFT HAND  Final   Special Requests   Final    BOTTLES DRAWN AEROBIC AND ANAEROBIC Blood Culture adequate volume   Culture   Final    NO GROWTH 3 DAYS Performed at [facility], [address] [place], [place] [address]    Report Status PENDING  Incomplete  MRSA PCR Screening     Status: None   Collection Time: 08/25/17  5:20 PM  Result Value Ref Range Status   MRSA by PCR NEGATIVE NEGATIVE Final    Comment:        The GeneXpert MRSA Assay (FDA approved for NASAL specimens only), is one component of a comprehensive MRSA colonization surveillance program. It is not intended to diagnose MRSA infection nor to guide or monitor treatment for MRSA infections. Performed at [facility], [address] [place], [place] [address]      Studies: No results found. Scheduled Meds: . enoxaparin (LOVENOX) injection  40 mg Subcutaneous Q24H  . escitalopram  20 mg Oral Daily  . guaiFENesin  1,200 mg Oral BID  . mouth rinse  15 mL Mouth Rinse BID  . pantoprazole (PROTONIX) IV  40 mg Intravenous Q24H  . sodium chloride flush  3 mL Intravenous Q12H   Continuous Infusions: . sodium chloride    . ampicillin-sulbactam (UNASYN) IV Stopped (08/28/17 [id number])    Principal Problem:   Acute respiratory failure with hypoxia (HCC) Active Problems:   Asthma   Sleep apnea   Pyelonephritis   Opioid overdose (HCC)   Elevated liver enzymes   Acute heart failure (HCC)   Elevated brain natriuretic peptide (BNP) level   E. coli UTI   Multifocal pneumonia   Aspiration pneumonia (HCC)  Standley Dakinslanford Johnson, MD, FAAFP [facility] Pager [phone]  If 7PM-7AM, please contact night-coverage [email or web address] Password [email or web address] 08/28/2017, 9:51 AM    LOS: 3 days

## 2017-08-29 DIAGNOSIS — T402X1D Poisoning by other opioids, accidental (unintentional), subsequent encounter: Secondary | ICD-10-CM

## 2017-08-29 LAB — COMPREHENSIVE METABOLIC PANEL
ALBUMIN: 2.1 g/dL — AB (ref 3.5–5.0)
ALK PHOS: 120 U/L (ref 38–126)
ALT: 25 U/L (ref 14–54)
ANION GAP: 10 (ref 5–15)
AST: 18 U/L (ref 15–41)
BILIRUBIN TOTAL: 0.5 mg/dL (ref 0.3–1.2)
BUN: 13 mg/dL (ref 6–20)
CALCIUM: 7.9 mg/dL — AB (ref 8.9–10.3)
CO2: 27 mmol/L (ref 22–32)
Chloride: 102 mmol/L (ref 101–111)
Creatinine, Ser: 0.63 mg/dL (ref 0.44–1.00)
GFR calc Af Amer: >60 mL/min (ref >60)
GFR calc non Af Amer: >60 mL/min (ref >60)
GLUCOSE: 107 mg/dL — AB (ref 65–99)
Potassium: 3.6 mmol/L (ref 3.5–5.1)
Sodium: 139 mmol/L (ref 135–145)
TOTAL PROTEIN: 5.6 g/dL — AB (ref 6.5–8.1)

## 2017-08-29 LAB — MAGNESIUM: Magnesium: 1.8 mg/dL (ref 1.7–2.4)

## 2017-08-29 MED ORDER — DOXYCYCLINE HYCLATE 100 MG PO CAPS: 100.0000 mg | ORAL_CAPSULE | Freq: Two times a day (BID) | ORAL | 0 refills | Status: AC

## 2017-08-29 MED ORDER — PANTOPRAZOLE SODIUM 40 MG PO TBEC
40.0000 mg | DELAYED_RELEASE_TABLET | Freq: Every day | ORAL | Status: DC
  Administered 2017-08-29: 40 mg via ORAL
  Filled 2017-08-29: qty 1

## 2017-08-29 MED ORDER — GUAIFENESIN ER 600 MG PO TB12: 1200.0000 mg | ORAL_TABLET | Freq: Two times a day (BID) | ORAL | 0 refills | Status: AC

## 2017-08-29 NOTE — Discharge Summary (Signed)
Physician Discharge Summary  Brooke LarsenKathryn J Milbourne ZOX:[id number] DOB: [birth year] DOA: 08/25/2017  PCP: Rebecka ApleyHemberg, Katherine V, NP  Admit date: 08/25/2017 Discharge date: 08/29/2017  Admitted From: Home Disposition: Home Recommendations for Outpatient Follow-up:  1. Follow up with PCP in 1 weeks 2. Please obtain BMP/CBC in one week 3. Please follow up on the following pending results:Final Culture Data  Please take your ciprofloxacin tablets for 7 more days and then discontinue. Please take your doxycycline tablets for 7 days and then discontinue. Please avoid taking any opioids or narcotic medications. Please follow-up with your primary care physician within 5-7 days. Please have a repeat chest x-ray done within 4-6 weeks.  Discharge Condition: Stable CODE STATUS: Full  Brief Hospitalization Summary: Please see all hospital notes, images, labs for full details of the hospitalization.  HPI: Brooke LarsenKathryn J Espinoza is a 32 y.o. female with history of asthma, OSA, chronic nicotine dependence, recently discharged yesterday from hospitalization for right pyelonephritis.  The patient had been refusing Lovenox injections in the hospital.  She apparently was found down at a friend's home and was basically unresponsive and cyanotic.  EMS was called and noted that the patient was severely hypoxic with a pulse ox of 15%.  The patient was severely obtunded when she arrived to the ED.  She was immediately placed on oxygen and her pulse ox improved.  At that point she was placed on BiPAP for respiratory support which she responded very well to.  Later after her mentation improved the patient was questioned and reported that she had no symptoms of chest pain but had severe shortness of breath symptoms.  She denied having palpitations.  Of note, her BNP was elevated and her chest x-ray was significant for signs of pulmonary edema and she was given a dose of IV Lasix in the ED.  Her troponin was mildly elevated but trending  down from several days ago.  She was seen by cardiology.  They recommended checking a d-dimer which was markedly elevated at 4.88.  The patient was given Lovenox injection for full anticoagulation pending the results of a CT angios to rule out acute pulmonary embolism.  The patient was obtunded on arrival but responded to a dose of naloxone given in ED and became much more alert and responsive.  The patient's urine drug screen was positive for opioids and amphetamines which she is known to be taking for adult ADD.  Patient is being admitted with a presumed diagnosis of acute pulmonary embolus to the stepdown unit on BiPAP.  Brief Admission Hx: Etta QuillKathryn J Groganis a 31 y.o.femalewith history of asthma, OSA, chronic nicotine dependence, recently discharged yesterday from hospitalization for right pyelonephritis. The patient had been refusing Lovenox injections in the hospital. She apparently was found down at a friend's home and was basically unresponsive and cyanotic.  MDM/Assessment & Plan:   1. Acute respiratory failure with hypoxia-CTA negative for PE.  She does have multifocal pneumonia likely from aspiration while she was down.  I suspect opioid overdose as she responded to naloxone given in ED.  She was initially treated with bipap and now on nasal cannula and has been weaned off oxygen completely prior to discharge.  Pulmonary consulted.  Discharge home with close outpatient follow-up.  2. Aspiration pneumonia - clinically improving, discharged home on oral doxycycline.  3. E. coli UTI with right pyelonephritis- continue ciprofloxacin for an additional 7 days and then discontinue. 4. Elevated liver enzymes-improved.  5. Acute heart failure-She was given a dose of  IV lasix in ED and has diuresed well.   6. OSA-she has not been using CPAP at home.   7. Asthma-we will continue nebulizer treatments. Continue supportive therapy with oxygen [place].  8. Hypokalemia -repleted. 9. Hypernatremia -  resolved after small fluid bolus of D5W given 2/16.   DVT Prophylaxis:Lovenox Code Status:Full Family Communication:Mother at bedside Disposition Plan:Home  Consultants:  pulmonology  Procedures:  bipap  Discharge Diagnoses:  Principal Problem:   Acute respiratory failure with hypoxia (HCC) Active Problems:   Asthma   Sleep apnea   Pyelonephritis   Opioid overdose (HCC)   Elevated liver enzymes   Acute heart failure (HCC)   Elevated brain natriuretic peptide (BNP) level   E. coli UTI   Multifocal pneumonia   Aspiration pneumonia [facility])  Discharge Instructions: Discharge Instructions    Call MD for:  difficulty breathing, headache or visual disturbances   Complete by:  As directed    Call MD for:  extreme fatigue   Complete by:  As directed    Call MD for:  persistant dizziness or light-headedness   Complete by:  As directed    Call MD for:  persistant nausea and vomiting   Complete by:  As directed    Call MD for:  severe uncontrolled pain   Complete by:  As directed    Increase activity slowly   Complete by:  As directed      Allergies as of 08/29/2017   No Known Allergies     Medication List    STOP taking these medications   HYDROcodone-acetaminophen 5-325 MG tablet Commonly known as:  NORCO/VICODIN     TAKE these medications   acetaminophen 500 MG tablet Commonly known as:  TYLENOL Take 1,000 mg by mouth every 6 (six) hours as needed.   ADDERALL 20 MG tablet Generic drug:  amphetamine-dextroamphetamine Take 1 tablet by mouth 2 (two) times daily.   benzonatate 100 MG capsule Commonly known as:  TESSALON Take 1-2 capsules (100-200 mg total) by mouth 3 (three) times daily as needed for cough.   ciprofloxacin 500 MG tablet Commonly known as:  CIPRO Take 1 tablet (500 mg total) by mouth 2 (two) times daily for 12 days.   clonazePAM 1 MG tablet Commonly known as:  KLONOPIN Take 1 mg by mouth 3 (three) times daily.   doxycycline 100 MG  capsule Commonly known as:  VIBRAMYCIN Take 1 capsule (100 mg total) by mouth 2 (two) times daily for 7 days.   escitalopram 20 MG tablet Commonly known as:  LEXAPRO Take 20 mg by mouth daily.   guaiFENesin 600 MG 12 hr tablet Commonly known as:  MUCINEX Take 2 tablets (1,200 mg total) by mouth 2 (two) times daily for 5 days.      Follow-up Information    Hemberg, Ruby Cola, NP. Schedule an appointment as soon as possible for a visit in 1 week(s).   Specialty:  Adult Health Nurse Practitioner Why:  Hospital follow-up Contact information: [address] [place] [address] [phone]          No Known Allergies Allergies as of 08/29/2017   No Known Allergies     Medication List    STOP taking these medications   HYDROcodone-acetaminophen 5-325 MG tablet Commonly known as:  NORCO/VICODIN     TAKE these medications   acetaminophen 500 MG tablet Commonly known as:  TYLENOL Take 1,000 mg by mouth every 6 (six) hours as needed.   ADDERALL 20 MG tablet  Generic drug:  amphetamine-dextroamphetamine Take 1 tablet by mouth 2 (two) times daily.   benzonatate 100 MG capsule Commonly known as:  TESSALON Take 1-2 capsules (100-200 mg total) by mouth 3 (three) times daily as needed for cough.   ciprofloxacin 500 MG tablet Commonly known as:  CIPRO Take 1 tablet (500 mg total) by mouth 2 (two) times daily for 12 days.   clonazePAM 1 MG tablet Commonly known as:  KLONOPIN Take 1 mg by mouth 3 (three) times daily.   doxycycline 100 MG capsule Commonly known as:  VIBRAMYCIN Take 1 capsule (100 mg total) by mouth 2 (two) times daily for 7 days.   escitalopram 20 MG tablet Commonly known as:  LEXAPRO Take 20 mg by mouth daily.   guaiFENesin 600 MG 12 hr tablet Commonly known as:  MUCINEX Take 2 tablets (1,200 mg total) by mouth 2 (two) times daily for 5 days.       Procedures/Studies: Ct Angio Chest Pe W Or Wo Contrast  Result Date: 08/25/2017 CLINICAL  DATA:  Evaluate for pulmonary embolus. High pretest probability. Shortness of breath. EXAM: CT ANGIOGRAPHY CHEST WITH CONTRAST TECHNIQUE: Multidetector CT imaging of the chest was performed using the standard protocol during bolus administration of intravenous contrast. Multiplanar CT image reconstructions and MIPs were obtained to evaluate the vascular anatomy. CONTRAST:  80mL ISOVUE-370 IOPAMIDOL (ISOVUE-370) INJECTION 76% COMPARISON:  None FINDINGS: Cardiovascular: The heart size appears normal. No pericardial effusion. The main pulmonary artery is patent. No saddle embolus or central obstructing embolus identified. No lobar or segmental pulmonary artery filling defects identified. Mediastinum/Nodes: Normal appearance of the thyroid gland. The trachea appears patent and is midline. Normal appearance of the esophagus. No mediastinal or hilar adenopathy. No axillary or supraclavicular adenopathy. Lungs/Pleura: There are moderate bilateral pleural effusions. Extensive bilateral multifocal airspace consolidation involving the upper and lower lobes with surrounding areas of ground-glass attenuation. Upper Abdomen: No acute abnormality. Musculoskeletal: No chest wall abnormality. No acute or significant osseous findings. Review of the MIP images confirms the above findings. IMPRESSION: 1. No evidence for acute pulmonary emboli. 2. Since 08/22/2017 there has been interval development of extensive bilateral multi lobar airspace consolidation with surrounding ground-glass attenuation and moderate bilateral pleural effusions. Differential considerations are broad including but not limited to multi lobar pneumonia, drug toxicity, or aspiration. Electronically Signed   By: Signa Kell M.D.   On: 08/25/2017 16:32   US Renal  Result Date: 08/23/2017 CLINICAL DATA:  Pyelonephritis EXAM: RENAL / URINARY TRACT ULTRASOUND COMPLETE COMPARISON:  None. FINDINGS: Right Kidney: Length: 14.3 cm. Echogenicity and renal cortical  thickness are within normal limits. No hydronephrosis hydronephrosis visualized. There is no well-defined mass. However, there is increased echogenicity along the periphery of the upper pole the right kidney, an appearance suggesting acute lobar nephronia/bacterial nephritis in this region on the right. This area of abnormal echogenicity measures approximately 3.8 x 2.2 cm. There is no frank abscess seen. Minimal perinephric fluid is seen near this area of apparent acute lobar nephronia. Left Kidney: Length: 14.1 cm. Echogenicity and renal cortical thickness are within normal limits. No mass, perinephric fluid, or hydronephrosis visualized. No sonographically demonstrable calculus or ureterectasis. Bladder: Appears normal for degree of bladder distention. IMPRESSION: Focal increase in echogenicity along the upper pole of the right kidney peripherally, an appearance felt to be indicative of acute lobar nephronia/bacterial nephritis. No well-defined mass or abscess. Minimal perinephric fluid in the area of apparent inflammation on the right. Study otherwise unremarkable. These results will  be called to the ordering clinician or representative by the Radiologist Assistant, and communication documented in the PACS or zVision Dashboard. Electronically Signed   By: Bretta Bang III M.D.   On: 08/23/2017 10:15   Dg Chest Port 1 View  Result Date: 08/26/2017 CLINICAL DATA:  Multifocal pneumonia EXAM: PORTABLE CHEST 1 VIEW COMPARISON:  08/25/2017 FINDINGS: Severe bilateral airspace disease left greater than right is stable. No effusion. Heart size upper normal. IMPRESSION: Severe bilateral airspace disease unchanged.  No new findings. Electronically Signed   By: Marlan Palau M.D.   On: 08/26/2017 08:30   Dg Chest Port 1 View  Result Date: 08/25/2017 CLINICAL DATA:  Shortness of breath, asthma, smoker EXAM: PORTABLE CHEST 1 VIEW COMPARISON:  08/22/2017 FINDINGS: Bilateral interstitial and alveolar airspace  opacities in a perihilar distribution. No pleural effusion or pneumothorax. Stable cardiomediastinal silhouette. No acute osseous abnormality. IMPRESSION: Bilateral perihilar interstitial and alveolar airspace opacities which may reflect pulmonary edema versus multilobar pneumonia. Electronically Signed   By: Elige Ko   On: 08/25/2017 10:40   Dg Chest Port 1 View  Result Date: 08/22/2017 CLINICAL DATA:  Hypotension. EXAM: PORTABLE CHEST 1 VIEW COMPARISON:  Chest x-ray dated September 03, 2014. FINDINGS: The heart size and mediastinal contours are within normal limits. Normal pulmonary vascularity. Subsegmental atelectasis at the left lung base. No focal consolidation, pleural effusion, or pneumothorax. No acute osseous abnormality. IMPRESSION: Left basilar subsegmental atelectasis.  No active disease. Electronically Signed   By: Obie Dredge M.D.   On: 08/22/2017 11:44      Subjective: The patient is reporting that she is feeling much better today.  She has been weaned off the oxygen.  She denies having chest pain and shortness of breath.  She has no fever or chills.  Her flank pain has resolved.  Discharge Exam: Vitals:   08/28/17 2122 08/29/17 0552  BP: 135/76 128/78  Pulse: (!) 52 (!) 55  Resp: 20 20  Temp: (!) 97.4 F (36.3 C) 97.9 F (36.6 C)  SpO2: 98% 99%   Vitals:   08/28/17 0507 08/28/17 1300 08/28/17 2122 08/29/17 0552  BP: 125/90 119/73 135/76 128/78  Pulse: (!) 56 (!) 55 (!) 52 (!) 55  Resp: 20 20 20 20   Temp: 98.8 F (37.1 C) 98.6 F (37 C) (!) 97.4 F (36.3 C) 97.9 F (36.6 C)  TempSrc: Oral Oral Oral Oral  SpO2: 97% 98% 98% 99%  Weight: 92.1 kg (203 lb 1.6 oz)   92.1 kg (203 lb 1.4 oz)  Height:       General: Pt is alert, awake, not in acute distress Cardiovascular: RRR, S1/S2 +, no rubs, no gallops Respiratory: CTA bilaterally, no wheezing, no rhonchi Abdominal: Soft, NT, ND, bowel sounds + Extremities: no edema, no cyanosis   The results of  significant diagnostics from this hospitalization (including imaging, microbiology, ancillary and laboratory) are listed below for reference.     Microbiology: Recent Results (from the past 240 hour(s))  Urine C&S     Status: Abnormal   Collection Time: 08/20/17  3:56 PM  Result Value Ref Range Status   Specimen Description   Final    URINE, CLEAN CATCH Performed at [facility], [address], [place], [place] [address]    Special Requests   Final    NONE Performed at [facility], [address] [place], [place] [address]    Culture >=100,000 COLONIES/mL ESCHERICHIA COLI (A)  Final   Report Status 08/22/2017 FINAL  Final  Organism ID, Bacteria ESCHERICHIA COLI (A)  Final      Susceptibility   Escherichia coli - MIC*    AMPICILLIN >=32 RESISTANT Resistant     CEFAZOLIN <=4 SENSITIVE Sensitive     CEFTRIAXONE <=1 SENSITIVE Sensitive     CIPROFLOXACIN <=0.25 SENSITIVE Sensitive     GENTAMICIN <=1 SENSITIVE Sensitive     IMIPENEM <=0.25 SENSITIVE Sensitive     NITROFURANTOIN <=16 SENSITIVE Sensitive     TRIMETH/SULFA >=320 RESISTANT Resistant     AMPICILLIN/SULBACTAM 16 INTERMEDIATE Intermediate     PIP/TAZO <=4 SENSITIVE Sensitive     Extended ESBL NEGATIVE Sensitive     * >=100,000 COLONIES/mL ESCHERICHIA COLI  Urine culture     Status: Abnormal   Collection Time: 08/22/17 11:26 AM  Result Value Ref Range Status   Specimen Description   Final    URINE, RANDOM Performed at [facility], [address], [place], [place] [address]    Special Requests   Final    NONE Performed at [facility], [address], [place], [place] [address]    Culture (A)  Final    2,000 COLONIES/mL GROUP B STREP(S.AGALACTIAE)ISOLATED WITH MIXED BACTERIAL ORGANISMS TESTING AGAINST S. AGALACTIAE NOT ROUTINELY PERFORMED DUE TO PREDICTABILITY OF AMP/PEN/VAN SUSCEPTIBILITY. Performed at [facility], [address], [place], [place] [address]    Report Status 08/25/2017 FINAL  Final   Culture, blood (routine x 2)     Status: None   Collection Time: 08/22/17  8:56 PM  Result Value Ref Range Status   Specimen Description BLOOD LEFT ARM  Final   Special Requests   Final    BOTTLES DRAWN AEROBIC AND ANAEROBIC Blood Culture adequate volume   Culture   Final    NO GROWTH 5 DAYS Performed at [facility], [address], [place], [place] [address]    Report Status 08/27/2017 FINAL  Final  Culture, blood (routine x 2)     Status: None   Collection Time: 08/22/17  9:01 PM  Result Value Ref Range Status   Specimen Description BLOOD LEFT HAND  Final   Special Requests   Final    BOTTLES DRAWN AEROBIC AND ANAEROBIC Blood Culture adequate volume   Culture   Final    NO GROWTH 5 DAYS Performed at [facility], [address], [place], [place] [address]    Report Status 08/27/2017 FINAL  Final  Culture, blood (Routine X 2) w Reflex to ID Panel     Status: None (Preliminary result)   Collection Time: 08/25/17 11:10 AM  Result Value Ref Range Status   Specimen Description BLOOD RIGHT HAND  Final   Special Requests   Final    BOTTLES DRAWN AEROBIC AND ANAEROBIC Blood Culture adequate volume   Culture   Final    NO GROWTH 4 DAYS Performed at [facility], [address] [place], [place] [address]    Report Status PENDING  Incomplete  Culture, blood (Routine X 2) w Reflex to ID Panel     Status: None (Preliminary result)   Collection Time: 08/25/17 11:24 AM  Result Value Ref Range Status   Specimen Description BLOOD LEFT HAND  Final   Special Requests   Final    BOTTLES DRAWN AEROBIC AND ANAEROBIC Blood Culture adequate volume   Culture   Final    NO GROWTH 4 DAYS Performed at [facility], [address] [place], [place] [address]    Report Status PENDING  Incomplete  MRSA PCR  Screening     Status: None   Collection Time: 08/25/17  5:20 PM  Result Value Ref Range Status   MRSA by PCR NEGATIVE NEGATIVE Final    Comment:        The GeneXpert MRSA Assay (FDA approved  for NASAL specimens only), is one component of a comprehensive MRSA colonization surveillance program. It is not intended to diagnose MRSA infection nor to guide or monitor treatment for MRSA infections. Performed at [facility], [address], [place], [place] [address]      Labs: BNP (last 3 results) Recent Labs    08/25/17 1118 08/26/17 0412  BNP 605.0* 139.0*   Basic Metabolic Panel: Recent Labs  Lab 08/25/17 1026 08/26/17 0411 08/27/17 0436 08/28/17 0559 08/29/17 0628  NA 140 146* 141 143 139  K 3.5 3.1* 3.3* 3.6 3.6  CL 108 109 105 105 102  CO2 22 27 27 27 27   GLUCOSE 153* 123* 108* 89 107*  BUN 15 17 14 11 13   CREATININE 0.74 0.75 0.62 0.54 0.63  CALCIUM 7.7* 7.5* 7.3* 7.9* 7.9*  MG  --  1.8 2.0 1.7 1.8   Liver Function Tests: Recent Labs  Lab 08/25/17 1026 08/26/17 0411 08/27/17 0436 08/28/17 0559 08/29/17 0628  AST 93* 33 20 17 18   ALT 75* 48 37 31 25  ALKPHOS 174* 155* 152* 138* 120  BILITOT 0.4 0.9 0.6 0.6 0.5  PROT 5.7* 5.4* 5.1* 5.6* 5.6*  ALBUMIN 2.1* 2.0* 2.0* 2.1* 2.1*   No results for input(s): LIPASE, AMYLASE in the last 168 hours. No results for input(s): AMMONIA in the last 168 hours. CBC: Recent Labs  Lab 08/23/17 0352 08/24/17 0348 08/25/17 1026 08/26/17 0411 08/28/17 0559  WBC 6.3 7.7 9.9 8.8 9.3  NEUTROABS  --  6.6 7.8 6.7 7.0  HGB 10.4* 10.8* 11.4* 10.2* 10.0*  HCT 31.8* 32.9* 34.3* 30.1* 30.7*  MCV 88.1 87.5 86.2 86.7 87.7  PLT 123* 131* 162 162 230   Cardiac Enzymes: Recent Labs  Lab 08/22/17 2101 08/23/17 0352 08/23/17 0926 08/25/17 1026  TROPONINI 0.19* 0.18* 0.19* 0.15*   BNP: Invalid input(s): POCBNP CBG: No results for input(s): GLUCAP in the last 168 hours. D-Dimer No results for input(s): DDIMER in the last 72 hours. Hgb A1c No results for input(s): HGBA1C in the last 72 hours. Lipid Profile No results for input(s): CHOL, HDL, LDLCALC, TRIG, CHOLHDL, LDLDIRECT in the last 72 hours. Thyroid  function studies No results for input(s): TSH, T4TOTAL, T3FREE, THYROIDAB in the last 72 hours.  Invalid input(s): FREET3 Anemia work up No results for input(s): VITAMINB12, FOLATE, FERRITIN, TIBC, IRON, RETICCTPCT in the last 72 hours. Urinalysis    Component Value Date/Time   COLORURINE AMBER (A) 08/25/2017 1202   APPEARANCEUR TURBID (A) 08/25/2017 1202   LABSPEC 1.016 08/25/2017 1202   PHURINE 5.0 08/25/2017 1202   GLUCOSEU NEGATIVE 08/25/2017 1202   HGBUR SMALL (A) 08/25/2017 1202   BILIRUBINUR NEGATIVE 08/25/2017 1202   KETONESUR NEGATIVE 08/25/2017 1202   PROTEINUR 30 (A) 08/25/2017 1202   UROBILINOGEN 0.2 12/26/2012 1107   NITRITE NEGATIVE 08/25/2017 1202   LEUKOCYTESUR TRACE (A) 08/25/2017 1202   Sepsis Labs Invalid input(s): PROCALCITONIN,  WBC,  LACTICIDVEN Microbiology Recent Results (from the past 240 hour(s))  Urine C&S     Status: Abnormal   Collection Time: 08/20/17  3:56 PM  Result Value Ref Range Status   Specimen Description   Final    URINE, CLEAN CATCH Performed at [facility], [address], [place], [place] [address]    Special Requests   Final    NONE Performed at [facility], [address] [place], [place] [address]    Culture >=100,000 COLONIES/mL ESCHERICHIA COLI (A)  Final   Report Status 08/22/2017 FINAL  Final   Organism ID, Bacteria ESCHERICHIA COLI (A)  Final      Susceptibility   Escherichia coli - MIC*    AMPICILLIN >=32 RESISTANT Resistant     CEFAZOLIN <=4 SENSITIVE Sensitive     CEFTRIAXONE <=1 SENSITIVE Sensitive     CIPROFLOXACIN <=0.25 SENSITIVE Sensitive     GENTAMICIN <=1 SENSITIVE Sensitive     IMIPENEM <=0.25 SENSITIVE Sensitive     NITROFURANTOIN <=16 SENSITIVE Sensitive     TRIMETH/SULFA >=320 RESISTANT Resistant     AMPICILLIN/SULBACTAM 16 INTERMEDIATE Intermediate     PIP/TAZO <=4 SENSITIVE Sensitive     Extended ESBL NEGATIVE Sensitive     * >=100,000 COLONIES/mL ESCHERICHIA COLI  Urine culture     Status:  Abnormal   Collection Time: 08/22/17 11:26 AM  Result Value Ref Range Status   Specimen Description   Final    URINE, RANDOM Performed at [facility], [address] [place], [place] [address]    Special Requests   Final    NONE Performed at [facility], [address] [place], [place] [address]    Culture (A)  Final    2,000 COLONIES/mL GROUP B STREP(S.AGALACTIAE)ISOLATED WITH MIXED BACTERIAL ORGANISMS TESTING AGAINST S. AGALACTIAE NOT ROUTINELY PERFORMED DUE TO PREDICTABILITY OF AMP/PEN/VAN SUSCEPTIBILITY. Performed at [facility], [address] [place], [place] [address]    Report Status 08/25/2017 FINAL  Final  Culture, blood (routine x 2)     Status: None   Collection Time: 08/22/17  8:56 PM  Result Value Ref Range Status   Specimen Description BLOOD LEFT ARM  Final   Special Requests   Final    BOTTLES DRAWN AEROBIC AND ANAEROBIC Blood Culture adequate volume   Culture   Final    NO GROWTH 5 DAYS Performed at [facility], [address], [place], [place] [address]    Report Status 08/27/2017 FINAL  Final  Culture, blood (routine x 2)     Status: None   Collection Time: 08/22/17  9:01 PM  Result Value Ref Range Status   Specimen Description BLOOD LEFT HAND  Final   Special Requests   Final    BOTTLES DRAWN AEROBIC AND ANAEROBIC Blood Culture adequate volume   Culture   Final    NO GROWTH 5 DAYS Performed at [facility], [address], [place], [place] [address]    Report Status 08/27/2017 FINAL  Final  Culture, blood (Routine X 2) w Reflex to ID Panel     Status: None (Preliminary result)   Collection Time: 08/25/17 11:10 AM  Result Value Ref Range Status   Specimen Description BLOOD RIGHT HAND  Final   Special Requests   Final    BOTTLES DRAWN AEROBIC AND ANAEROBIC Blood Culture adequate volume   Culture   Final    NO GROWTH 4 DAYS Performed at [facility], [address] [place], [place] [address]    Report Status PENDING  Incomplete  Culture,  blood (Routine X 2) w Reflex to ID Panel     Status: None (Preliminary result)   Collection Time: 08/25/17 11:24 AM  Result Value Ref Range Status   Specimen Description BLOOD LEFT HAND  Final  Special Requests   Final    BOTTLES DRAWN AEROBIC AND ANAEROBIC Blood Culture adequate volume   Culture   Final    NO GROWTH 4 DAYS Performed at [facility], [address] [place], [place] [address]    Report Status PENDING  Incomplete  MRSA PCR Screening     Status: None   Collection Time: 08/25/17  5:20 PM  Result Value Ref Range Status   MRSA by PCR NEGATIVE NEGATIVE Final    Comment:        The GeneXpert MRSA Assay (FDA approved for NASAL specimens only), is one component of a comprehensive MRSA colonization surveillance program. It is not intended to diagnose MRSA infection nor to guide or monitor treatment for MRSA infections. Performed at [facility], [address], [place], [place] [address]    Time coordinating discharge: 32 minutes  SIGNED:  Standley Dakins, MD  [facility] 08/29/2017, 11:50 AM Pager [phone]  If 7PM-7AM, please contact night-coverage [email or web address] Password [email or web address]

## 2017-08-29 NOTE — Progress Notes (Signed)
Subjective: She was admitted after being found unresponsive and cyanotic.  There was concern that she might have had pulmonary embolism but this did not show on her chest CT.  She does have diffuse bilateral aspiration she is doing better and plans are for her to perhaps be discharged home today.  She is now on 2 L oxygen.  She did require BiPAP earlier  Objective: Vital signs in last 24 hours: Temp:  [97.4 F (36.3 C)-98.6 F (37 C)] 97.9 F (36.6 C) (02/19 0552) Pulse Rate:  [52-55] 55 (02/19 0552) Resp:  [20] 20 (02/19 0552) BP: (119-135)/(73-78) 128/78 (02/19 0552) SpO2:  [98 %-99 %] 99 % (02/19 0552) Weight:  [92.1 kg (203 lb 1.4 oz)] 92.1 kg (203 lb 1.4 oz) (02/19 0552) Weight change: -0.006 kg (-0.2 oz) Last BM Date: 08/28/17  Intake/Output from previous day: 02/18 0701 - 02/19 0700 In: 543 [P.O.:240; I.V.:3; IV Piggyback:300] Out: -   PHYSICAL EXAM General appearance: alert, cooperative and no distress Resp: Bases are clear she has some rales in the upper parts of her lungs bilaterally Cardio: regular rate and rhythm, S1, S2 normal, no murmur, click, rub or gallop GI: soft, non-tender; bowel sounds normal; no masses,  no organomegaly Extremities: extremities normal, atraumatic, no cyanosis or edema Skin warm and dry  Lab Results:  Results for orders placed or performed during the hospital encounter of 08/25/17 (from the past 48 hour(s))  Comprehensive metabolic panel     Status: Abnormal   Collection Time: 08/28/17  5:59 AM  Result Value Ref Range   Sodium 143 135 - 145 mmol/L   Potassium 3.6 3.5 - 5.1 mmol/L   Chloride 105 101 - 111 mmol/L   CO2 27 22 - 32 mmol/L   Glucose, Bld 89 65 - 99 mg/dL   BUN 11 6 - 20 mg/dL   Creatinine, Ser 0.54 0.44 - 1.00 mg/dL   Calcium 7.9 (L) 8.9 - 10.3 mg/dL   Total Protein 5.6 (L) 6.5 - 8.1 g/dL   Albumin 2.1 (L) 3.5 - 5.0 g/dL   AST 17 15 - 41 U/L   ALT 31 14 - 54 U/L   Alkaline Phosphatase 138 (H) 38 - 126 U/L   Total  Bilirubin 0.6 0.3 - 1.2 mg/dL   GFR calc non Af Amer >60 >60 mL/min   GFR calc Af Amer >60 >60 mL/min    Comment: (NOTE) The eGFR has been calculated using the CKD EPI equation. This calculation has not been validated in all clinical situations. eGFR's persistently <60 mL/min signify possible Chronic Kidney Disease.    Anion gap 11 5 - 15    Comment: Performed at [facility], [address] [place], [place] [address]  Magnesium     Status: None   Collection Time: 08/28/17  5:59 AM  Result Value Ref Range   Magnesium 1.7 1.7 - 2.4 mg/dL    Comment: Performed at [facility], [address] [place], [place] [address]  CBC with Differential/Platelet     Status: Abnormal   Collection Time: 08/28/17  5:59 AM  Result Value Ref Range   WBC 9.3 4.0 - 10.5 K/uL   RBC 3.50 (L) 3.87 - 5.11 MIL/uL   Hemoglobin 10.0 (L) 12.0 - 15.0 g/dL   HCT 30.7 (L) 36.0 - 46.0 %   MCV 87.7 78.0 - 100.0 fL   MCH 28.6 26.0 - 34.0 pg   MCHC 32.6 30.0 - 36.0 g/dL   RDW 15.5 11.5 - 15.5 %  Platelets 230 150 - 400 K/uL   Neutrophils Relative % 75 %   Neutro Abs 7.0 1.7 - 7.7 K/uL   Lymphocytes Relative 18 %   Lymphs Abs 1.7 0.7 - 4.0 K/uL   Monocytes Relative 6 %   Monocytes Absolute 0.5 0.1 - 1.0 K/uL   Eosinophils Relative 1 %   Eosinophils Absolute 0.1 0.0 - 0.7 K/uL   Basophils Relative 0 %   Basophils Absolute 0.0 0.0 - 0.1 K/uL    Comment: Performed at [facility], [address] [place], [place] [address]  Vancomycin, trough     Status: Abnormal   Collection Time: 08/28/17  9:08 AM  Result Value Ref Range   Vancomycin Tr <4 (L) 15 - 20 ug/mL    Comment: Performed at [facility], [address] [place], [place] [address]  Comprehensive metabolic panel     Status: Abnormal   Collection Time: 08/29/17  6:28 AM  Result Value Ref Range   Sodium 139 135 - 145 mmol/L   Potassium 3.6 3.5 - 5.1 mmol/L   Chloride 102 101 - 111 mmol/L   CO2 27 22 - 32 mmol/L   Glucose, Bld 107 (H) 65 - 99 mg/dL    BUN 13 6 - 20 mg/dL   Creatinine, Ser 0.63 0.44 - 1.00 mg/dL   Calcium 7.9 (L) 8.9 - 10.3 mg/dL   Total Protein 5.6 (L) 6.5 - 8.1 g/dL   Albumin 2.1 (L) 3.5 - 5.0 g/dL   AST 18 15 - 41 U/L   ALT 25 14 - 54 U/L   Alkaline Phosphatase 120 38 - 126 U/L   Total Bilirubin 0.5 0.3 - 1.2 mg/dL   GFR calc non Af Amer >60 >60 mL/min   GFR calc Af Amer >60 >60 mL/min    Comment: (NOTE) The eGFR has been calculated using the CKD EPI equation. This calculation has not been validated in all clinical situations. eGFR's persistently <60 mL/min signify possible Chronic Kidney Disease.    Anion gap 10 5 - 15    Comment: Performed at [facility], [address], [place], [place] [address]  Magnesium     Status: None   Collection Time: 08/29/17  6:28 AM  Result Value Ref Range   Magnesium 1.8 1.7 - 2.4 mg/dL    Comment: Performed at [facility], [address], [place], [place] [address]    ABGS No results for input(s): PHART, PO2ART, TCO2, HCO3 in the last 72 hours.  Invalid input(s): PCO2 CULTURES Recent Results (from the past 240 hour(s))  Urine C&S     Status: Abnormal   Collection Time: 08/20/17  3:56 PM  Result Value Ref Range Status   Specimen Description   Final    URINE, CLEAN CATCH Performed at [facility], [address] [place], [place] [address]    Special Requests   Final    NONE Performed at [facility], [address], [place], [place] [address]    Culture >=100,000 COLONIES/mL ESCHERICHIA COLI (A)  Final   Report Status 08/22/2017 FINAL  Final   Organism ID, Bacteria ESCHERICHIA COLI (A)  Final      Susceptibility   Escherichia coli - MIC*    AMPICILLIN >=32 RESISTANT Resistant     CEFAZOLIN <=4 SENSITIVE Sensitive     CEFTRIAXONE <=1 SENSITIVE Sensitive     CIPROFLOXACIN <=0.25 SENSITIVE Sensitive     GENTAMICIN <=1 SENSITIVE Sensitive     IMIPENEM <=0.25 SENSITIVE Sensitive  NITROFURANTOIN <=16 SENSITIVE Sensitive     TRIMETH/SULFA >=320 RESISTANT Resistant      AMPICILLIN/SULBACTAM 16 INTERMEDIATE Intermediate     PIP/TAZO <=4 SENSITIVE Sensitive     Extended ESBL NEGATIVE Sensitive     * >=100,000 COLONIES/mL ESCHERICHIA COLI  Urine culture     Status: Abnormal   Collection Time: 08/22/17 11:26 AM  Result Value Ref Range Status   Specimen Description   Final    URINE, RANDOM Performed at [facility], [address], [place], [place] [address]    Special Requests   Final    NONE Performed at [facility], [address] [place], [place] [address]    Culture (A)  Final    2,000 COLONIES/mL GROUP B STREP(S.AGALACTIAE)ISOLATED WITH MIXED BACTERIAL ORGANISMS TESTING AGAINST S. AGALACTIAE NOT ROUTINELY PERFORMED DUE TO PREDICTABILITY OF AMP/PEN/VAN SUSCEPTIBILITY. Performed at [facility], [place] [address] [place], [place] [address]    Report Status 08/25/2017 FINAL  Final  Culture, blood (routine x 2)     Status: None   Collection Time: 08/22/17  8:56 PM  Result Value Ref Range Status   Specimen Description BLOOD LEFT ARM  Final   Special Requests   Final    BOTTLES DRAWN AEROBIC AND ANAEROBIC Blood Culture adequate volume   Culture   Final    NO GROWTH 5 DAYS Performed at [facility], [address] [place], [place] [address]    Report Status 08/27/2017 FINAL  Final  Culture, blood (routine x 2)     Status: None   Collection Time: 08/22/17  9:01 PM  Result Value Ref Range Status   Specimen Description BLOOD LEFT HAND  Final   Special Requests   Final    BOTTLES DRAWN AEROBIC AND ANAEROBIC Blood Culture adequate volume   Culture   Final    NO GROWTH 5 DAYS Performed at [facility], [address], [place], [place] [address]    Report Status 08/27/2017 FINAL  Final  Culture, blood (Routine X 2) w Reflex to ID Panel     Status: None (Preliminary result)   Collection Time: 08/25/17 11:10 AM  Result Value Ref Range Status   Specimen Description BLOOD RIGHT HAND  Final   Special Requests   Final    BOTTLES DRAWN AEROBIC  AND ANAEROBIC Blood Culture adequate volume   Culture   Final    NO GROWTH 4 DAYS Performed at [facility], [address], [place], [place] [address]    Report Status PENDING  Incomplete  Culture, blood (Routine X 2) w Reflex to ID Panel     Status: None (Preliminary result)   Collection Time: 08/25/17 11:24 AM  Result Value Ref Range Status   Specimen Description BLOOD LEFT HAND  Final   Special Requests   Final    BOTTLES DRAWN AEROBIC AND ANAEROBIC Blood Culture adequate volume   Culture   Final    NO GROWTH 4 DAYS Performed at [facility], [address] [place], [place] [address]    Report Status PENDING  Incomplete  MRSA PCR Screening     Status: None   Collection Time: 08/25/17  5:20 PM  Result Value Ref Range Status   MRSA by PCR NEGATIVE NEGATIVE Final    Comment:        The GeneXpert MRSA Assay (FDA approved for NASAL specimens only), is one component of a comprehensive MRSA colonization surveillance program. It is not intended to diagnose MRSA infection nor to  guide or monitor treatment for MRSA infections. Performed at [facility], [address], [place], [place] [address]    Studies/Results: No results found.  Medications:  Prior to Admission:  Medications Prior to Admission  Medication Sig Dispense Refill Last Dose  . acetaminophen (TYLENOL) 500 MG tablet Take 1,000 mg by mouth every 6 (six) hours as needed.   Past Week at Unknown time  . amphetamine-dextroamphetamine (ADDERALL) 20 MG tablet Take 1 tablet by mouth 2 (two) times daily.   Past Week at Unknown time  . benzonatate (TESSALON) 100 MG capsule Take 1-2 capsules (100-200 mg total) by mouth 3 (three) times daily as needed for cough. 25 capsule 0 Past Week at Unknown time  . ciprofloxacin (CIPRO) 500 MG tablet Take 1 tablet (500 mg total) by mouth 2 (two) times daily for 12 days. 24 tablet 0 Past Week at Unknown time  . clonazePAM (KLONOPIN) 1 MG tablet Take 1 mg by mouth 3 (three) times daily.    Past Week at Unknown time  . escitalopram (LEXAPRO) 20 MG tablet Take 20 mg by mouth daily.   Past Week at Unknown time  . HYDROcodone-acetaminophen (NORCO/VICODIN) 5-325 MG tablet Take 1 tablet by mouth every 6 (six) hours as needed for severe pain. 15 tablet 0 Past Week at Unknown time   Scheduled: . enoxaparin (LOVENOX) injection  40 mg Subcutaneous Q24H  . escitalopram  20 mg Oral Daily  . guaiFENesin  1,200 mg Oral BID  . mouth rinse  15 mL Mouth Rinse BID  . pantoprazole  40 mg Oral Daily  . sodium chloride flush  3 mL Intravenous Q12H   Continuous: . sodium chloride    . ampicillin-sulbactam (UNASYN) IV 3 g (08/29/17 0527)   YBN:LWHKNZ chloride, acetaminophen, ketorolac, LORazepam, ondansetron (ZOFRAN) IV, sodium chloride flush  Assesment: She was admitted with acute hypoxic respiratory failure presumably from opioid overdose.  She had acute heart failure which is better.  She has aspiration pneumonia which is improving clinically.  She also had pyelonephritis earlier this month.  She has a history of asthma in childhood but has not had a lot of trouble with asthma in her adult years. Principal Problem:   Acute respiratory failure with hypoxia (HCC) Active Problems:   Asthma   Sleep apnea   Pyelonephritis   Opioid overdose (HCC)   Elevated liver enzymes   Acute heart failure (HCC)   Elevated brain natriuretic peptide (BNP) level   E. coli UTI   Multifocal pneumonia   Aspiration pneumonia (HCC)    Plan: I discontinued oxygen so that we can see if she is going to need oxygen at home.  This will be checked in about 30 minutes.  I agree she could be switched to oral antibiotics.  Probably okay for discharge from a pulmonary point of view    LOS: 4 days   Kenneisha Cochrane L 08/29/2017, 8:50 AM

## 2017-08-29 NOTE — Care Management Note (Signed)
Case Management Note  Patient Details  Name: Brooke LarsenKathryn J Espinoza MRN: [id number] Date of Birth: [birth year]  Subjective/Objective:     Pt admitted with pneumonia, pyelonephritis and acute CHF. CM has reviewed pt chart for CM needs. [place] is from home, [place]. Has PCP, transportation and insurance with drug coverage.    Pt has successfully weaned from oxygen.             Action/Plan: DC home today with self care. No CM needs noted.   Expected Discharge Date:  08/29/17               Expected Discharge Plan:   Home, self care.   If discussed at Long Length of Stay Meetings, dates discussed:    Additional Comments:  Malcolm MetroChildress, Billiejean Schimek Demske, RN 08/29/2017, 12:33 PM

## 2017-08-29 NOTE — Progress Notes (Signed)
SATURATION QUALIFICATIONS: (This note is used to comply with regulatory documentation for home oxygen)  Patient Saturations on Room Air at Rest = 99%  Patient Saturations on Room Air while Ambulating = 100%  

## 2017-08-29 NOTE — Progress Notes (Signed)
Discharge instructions gone over with patient, verbalized understanding. IV removed by NT, patient tolerated procedure well. Follow up PCP appointment made for patient.

## 2017-08-29 NOTE — Discharge Instructions (Signed)
Please take your ciprofloxacin tablets for 7 more days and then discontinue. Please take your doxycycline tablets for 7 days and then discontinue. Please avoid taking any opioids or narcotic medications. Please follow-up with your primary care physician within 5-7 days. Please have a repeat chest x-ray done within 4-6 weeks.  Follow with Primary MD  Ihor AustinHemberg, Ruby ColaKatherine V, NP  and other consultant's as instructed your Hospitalist MD  Please get a complete blood count and chemistry panel checked by your Primary MD at your next visit, and again as instructed by your Primary MD.  Get Medicines reviewed and adjusted: Please take all your medications with you for your next visit with your Primary MD  Laboratory/radiological data: Please request your Primary MD to go over all hospital tests and procedure/radiological results at the follow up, please ask your Primary MD to get all Hospital records sent to his/her office.  In some cases, they will be blood work, cultures and biopsy results pending at the time of your discharge. Please request that your primary care M.D. follows up on these results.  Also Note the following: If you experience worsening of your admission symptoms, develop shortness of breath, life threatening emergency, suicidal or homicidal thoughts you must seek medical attention immediately by calling 911 or calling your MD immediately  if symptoms less severe.  You must read complete instructions/literature along with all the possible adverse reactions/side effects for all the Medicines you take and that have been prescribed to you. Take any new Medicines after you have completely understood and accpet all the possible adverse reactions/side effects.   Do not drive when taking Pain medications or sleeping medications (Benzodaizepines)  Do not take more than prescribed Pain, Sleep and Anxiety Medications. It is not advisable to combine anxiety,sleep and pain medications without  talking with your primary care practitioner  Special Instructions: If you have smoked or chewed Tobacco  in the last 2 yrs please stop smoking, stop any regular Alcohol  and or any Recreational drug use.  Wear Seat belts while driving.  Please note: You were cared for by a hospitalist during your hospital stay. Once you are discharged, your primary care physician will handle any further medical issues. Please note that NO REFILLS for any discharge medications will be authorized once you are discharged, as it is imperative that you return to your primary care physician (or establish a relationship with a primary care physician if you do not have one) for your post hospital discharge needs so that they can reassess your need for medications and monitor your lab values.

## 2017-08-30 LAB — CULTURE, BLOOD (ROUTINE X 2)
Culture: NO GROWTH
Culture: NO GROWTH
SPECIAL REQUESTS: ADEQUATE
Special Requests: ADEQUATE

## 2019-06-11 ENCOUNTER — Other Ambulatory Visit: Payer: Self-pay | Admitting: Orthopedic Surgery

## 2019-06-21 NOTE — Progress Notes (Signed)
[facility] [address], [place] [address] [place] [address] Phone: [phone] Fax: [phone]  [facility] [address] [place] [address] Phone: [phone] Fax: [phone]  [facility] [phone] - [place], [place] - [place] [address] [place] [address] Phone: [phone] Fax: [phone]      Your procedure is scheduled on Wednesday, December 16. 2020.  Report to [facility]tr[facility] "A" at 2 P.M., and check in at the Admitting office.  Call this number if you have problems the morning of surgery:  [phone]  Call [phone] if you have any questions prior to your surgery date Monday-Friday 8am-4pm    Remember:  Do not eat after midnight the night before your surgery  You may drink clear liquids until 2 PM the day of your surgery.   Clear liquids allowed are: Water, Non-Citrus Juices (without pulp), Carbonated Beverages, Clear Tea, Black Coffee Only, and Gatorade   Please complete your PRE-SURGERY WATER that was provided to you by 2 PM the day of surgery.  Please, if able, drink it in one setting. DO NOT SIP.    Take these medicines the morning of surgery with A SIP OF WATER: Hydrocodone-acetaminophen (Norco/Vicodin) if needed  As of today, STOP taking any Aspirin (unless otherwise instructed by your surgeon), Aleve, Naproxen, Ibuprofen, Motrin, Advil, Goody's, BC's, all herbal medications, fish oil, and all vitamins.   HOW TO MANAGE YOUR DIABETES BEFORE AND AFTER SURGERY  Why is it important to control my blood sugar before and after surgery? . Improving blood sugar levels before and after surgery helps healing and can limit problems. . A way of improving blood sugar control is eating a healthy diet by: o  Eating less sugar and carbohydrates o  Increasing activity/exercise o  Talking with your doctor about reaching  your blood sugar goals . High blood sugars (greater than 180 mg/dL) can raise your risk of infections and slow your recovery, so you will need to focus on controlling your diabetes during the weeks before surgery. . Make sure that the doctor who takes care of your diabetes knows about your planned surgery including the date and location.  How do I manage my blood sugar before surgery? . Check your blood sugar at least 4 times a day, starting 2 days before surgery, to make sure that the level is not too high or low. . Check your blood sugar the morning of your surgery when you wake up and every 2 hours until you get to the Short Stay unit. o If your blood sugar is less than 70 mg/dL, you will need to treat for low blood sugar: - Do not take insulin. - Treat a low blood sugar (less than 70 mg/dL) with  cup of clear juice (cranberry or apple), 4 glucose tablets, OR glucose gel. - Recheck blood sugar in 15 minutes after treatment (to make sure it is greater than 70 mg/dL). If your blood sugar is not greater than 70 mg/dL on recheck, call [phone] for further instructions. . Report your blood sugar to the short stay nurse when you get to Short Stay.  . If you are admitted to the hospital after surgery: o Your blood sugar will be checked by the staff and you will probably be given insulin after surgery (instead of oral  diabetes medicines) to make sure you have good blood sugar levels. o The goal for blood sugar control after surgery is 80-180 mg/dL.    The Morning of Surgery  Do not wear jewelry, make-up or nail polish.  Do not wear lotions, powders, or perfumes, or deodorant  Do not shave 48 hours prior to surgery.  Do not bring valuables to the hospital.  [facility] is not responsible for any belongings or valuables.  If you are a smoker, DO NOT Smoke 24 hours prior to surgery  If you wear a CPAP at night please bring your mask, tubing, and machine the morning of surgery   Remember  that you must have someone to transport you home after your surgery, and remain with you for 24 hours if you are discharged the same day.   Please bring cases for contacts, glasses, hearing aids, dentures or bridgework because it cannot be worn into surgery.    Leave your suitcase in the car.  After surgery it may be brought to your room.  For patients admitted to the hospital, discharge time will be determined by your treatment team.  Patients discharged the day of surgery will not be allowed to drive home.    Special instructions:   [facility]- Preparing For Surgery  Before surgery, you can play an important role. Because skin is not sterile, your skin needs to be as free of germs as possible. You can reduce the number of germs on your skin by washing with CHG (chlorahexidine gluconate) Soap before surgery.  CHG is an antiseptic cleaner which kills germs and bonds with the skin to continue killing germs even after washing.    Oral Hygiene is also important to reduce your risk of infection.  Remember - BRUSH YOUR TEETH THE MORNING OF SURGERY WITH YOUR REGULAR TOOTHPASTE  Please do not use if you have an allergy to CHG or antibacterial soaps. If your skin becomes reddened/irritated stop using the CHG.  Do not shave (including legs and underarms) for at least 48 hours prior to first CHG shower. It is OK to shave your face.  Please follow these instructions carefully.   1. Shower the NIGHT BEFORE SURGERY and the MORNING OF SURGERY with CHG Soap.   2. If you chose to wash your hair, wash your hair first as usual with your normal shampoo.  3. After you shampoo, rinse your hair and body thoroughly to remove the shampoo.  4. Use CHG as you would any other liquid soap. You can apply CHG directly to the skin and wash gently with a scrungie or a clean washcloth.   5. Apply the CHG Soap to your body ONLY FROM THE NECK DOWN.  Do not use on open wounds or open sores. Avoid contact with your  eyes, ears, mouth and genitals (private parts). Wash Face and genitals (private parts)  with your normal soap.   6. Wash thoroughly, paying special attention to the area where your surgery will be performed.  7. Thoroughly rinse your body with warm water from the neck down.  8. DO NOT shower/wash with your normal soap after using and rinsing off the CHG Soap.  9. Pat yourself dry with a CLEAN TOWEL.  10. Wear CLEAN PAJAMAS to bed the night before surgery, wear comfortable clothes the morning of surgery  11. Place CLEAN SHEETS on your bed the night of your first shower and DO NOT SLEEP WITH PETS.    Day of Surgery:  Please shower  the morning of surgery with the CHG soap Do not apply any deodorants/lotions. Please wear clean clothes to the hospital/surgery center.   Remember to brush your teeth WITH YOUR REGULAR TOOTHPASTE.   Please read over the following fact sheets that you were given.

## 2019-06-24 ENCOUNTER — Other Ambulatory Visit: Payer: Self-pay

## 2019-06-24 ENCOUNTER — Other Ambulatory Visit (HOSPITAL_COMMUNITY)
Admission: RE | Admit: 2019-06-24 | Discharge: 2019-06-24 | Disposition: A | Discharge status: Home / Self Care | Payer: BC Managed Care – PPO | Source: Ambulatory Visit | Attending: Orthopedic Surgery | Admitting: Orthopedic Surgery

## 2019-06-24 ENCOUNTER — Encounter (HOSPITAL_COMMUNITY): Payer: Self-pay

## 2019-06-24 ENCOUNTER — Encounter (HOSPITAL_COMMUNITY)
Admission: RE | Admit: 2019-06-24 | Discharge: 2019-06-24 | Disposition: A | Discharge status: Home / Self Care | Payer: BC Managed Care – PPO | Source: Ambulatory Visit | Attending: Orthopedic Surgery | Admitting: Orthopedic Surgery

## 2019-06-24 DIAGNOSIS — I509 Heart failure, unspecified: Secondary | ICD-10-CM | POA: Diagnosis not present

## 2019-06-24 DIAGNOSIS — I110 Hypertensive heart disease with heart failure: Secondary | ICD-10-CM | POA: Diagnosis not present

## 2019-06-24 DIAGNOSIS — Z01818 Encounter for other preprocedural examination: Secondary | ICD-10-CM | POA: Diagnosis present

## 2019-06-24 DIAGNOSIS — M5412 Radiculopathy, cervical region: Secondary | ICD-10-CM | POA: Diagnosis not present

## 2019-06-24 HISTORY — DX: Unspecified osteoarthritis, unspecified site: M19.90

## 2019-06-24 HISTORY — DX: Headache, unspecified: R51.9

## 2019-06-24 HISTORY — DX: Anxiety disorder, unspecified: F41.9

## 2019-06-24 LAB — SURGICAL PCR SCREEN
MRSA, PCR: NEGATIVE
Staphylococcus aureus: NEGATIVE

## 2019-06-24 LAB — URINALYSIS, ROUTINE W REFLEX MICROSCOPIC
Bilirubin Urine: NEGATIVE
Glucose, UA: NEGATIVE mg/dL
Hgb urine dipstick: NEGATIVE
Ketones, ur: NEGATIVE mg/dL
Nitrite: POSITIVE — AB
Protein, ur: NEGATIVE mg/dL
Specific Gravity, Urine: 1.02 (ref 1.005–1.030)
pH: 5 (ref 5.0–8.0)

## 2019-06-24 LAB — CBC WITH DIFFERENTIAL/PLATELET
Abs Immature Granulocytes: 0.01 10*3/uL (ref 0.00–0.07)
Basophils Absolute: 0 10*3/uL (ref 0.0–0.1)
Basophils Relative: 1 %
Eosinophils Absolute: 0.1 10*3/uL (ref 0.0–0.5)
Eosinophils Relative: 2 %
HCT: 45.6 % (ref 36.0–46.0)
Hemoglobin: 15.1 g/dL — ABNORMAL HIGH (ref 12.0–15.0)
Immature Granulocytes: 0 %
Lymphocytes Relative: 40 %
Lymphs Abs: 2.4 10*3/uL (ref 0.7–4.0)
MCH: 28.5 pg (ref 26.0–34.0)
MCHC: 33.1 g/dL (ref 30.0–36.0)
MCV: 86.2 fL (ref 80.0–100.0)
Monocytes Absolute: 0.5 10*3/uL (ref 0.1–1.0)
Monocytes Relative: 9 %
Neutro Abs: 3 10*3/uL (ref 1.7–7.7)
Neutrophils Relative %: 48 %
Platelets: 240 10*3/uL (ref 150–400)
RBC: 5.29 MIL/uL — ABNORMAL HIGH (ref 3.87–5.11)
RDW: 13.5 % (ref 11.5–15.5)
WBC: 6 10*3/uL (ref 4.0–10.5)
nRBC: 0 % (ref 0.0–0.2)

## 2019-06-24 LAB — COMPREHENSIVE METABOLIC PANEL
ALT: 62 U/L — ABNORMAL HIGH (ref 0–44)
AST: 29 U/L (ref 15–41)
Albumin: 3.8 g/dL (ref 3.5–5.0)
Alkaline Phosphatase: 60 U/L (ref 38–126)
Anion gap: 10 (ref 5–15)
BUN: 15 mg/dL (ref 6–20)
CO2: 27 mmol/L (ref 22–32)
Calcium: 9.1 mg/dL (ref 8.9–10.3)
Chloride: 102 mmol/L (ref 98–111)
Creatinine, Ser: 0.72 mg/dL (ref 0.44–1.00)
GFR calc Af Amer: >60 mL/min (ref >60)
GFR calc non Af Amer: >60 mL/min (ref >60)
Glucose, Bld: 97 mg/dL (ref 70–99)
Potassium: 4.1 mmol/L (ref 3.5–5.1)
Sodium: 139 mmol/L (ref 135–145)
Total Bilirubin: 1 mg/dL (ref 0.3–1.2)
Total Protein: 7 g/dL (ref 6.5–8.1)

## 2019-06-24 LAB — PROTIME-INR
INR: 0.9 (ref 0.8–1.2)
Prothrombin Time: 12.3 seconds (ref 11.4–15.2)

## 2019-06-24 LAB — APTT: aPTT: 27 seconds (ref 24–36)

## 2019-06-24 LAB — SARS CORONAVIRUS 2 (TAT 6-24 HRS): SARS Coronavirus 2: NEGATIVE

## 2019-06-24 NOTE — Progress Notes (Addendum)
PCP - none Cardiologist -none   -   R   Chest x-ray - na EKG - today Stress Test - na ECHO - 2/19 Cardiac Cath - na  Sleep Study - yes CPAP - no  -    Blood Thinner Instruct - stop ERAS Protcol -yes PRE-SURGERY Ensure -   Ensure not diabetic  COVID TEST- today   Anesthesia review: urinalysis results called to Dr Lynann Bologna office  Patient denies shortness of breath, fever, cough and chest pain at PAT appointment   All instructions explained to the patient, with a verbal understanding of the material. Patient agrees to go over the instructions while at home for a better understanding. Patient also instructed to self quarantine after being tested for COVID-19. The opportunity to ask questions was provided.

## 2019-06-25 NOTE — Anesthesia Preprocedure Evaluation (Addendum)
Anesthesia Evaluation  Patient identified by MRN, date of birth, ID band Patient awake    Reviewed: Allergy & Precautions, NPO status , Patient's Chart, lab work & pertinent test results  Airway Mallampati: II  TM Distance: >3 FB Neck ROM: Limited    Dental no notable dental hx. (+) Teeth Intact, Dental Advisory Given   Pulmonary sleep apnea (does not use CPAP) , Current Smoker and Patient abstained from smoking.,    Pulmonary exam normal breath sounds clear to auscultation       Cardiovascular negative cardio ROS Normal cardiovascular exam Rhythm:Regular Rate:Normal  TTE 2019 - Left ventricle: The cavity size was normal. Wall thickness was normal. Systolic function was normal. The estimated ejection fraction was in the range of 50% to 55%. Wall motion was normal; there were no regional wall motion abnormalities. Left ventricular diastolic function parameters were normal. - Mitral valve: There was mild regurgitation. - Atrial septum: No defect or patent foramen ovale was identified. - Tricuspid valve: There was mild regurgitation. - Pulmonary arteries: PA peak pressure: 44 mm Hg (S). - Inferior vena cava: The vessel was dilated. The respirophasic diameter changes were blunted (< 50%), consistent with elevated central venous pressure. Estimated CVP 15 mmHg.   Neuro/Psych  Headaches, PSYCHIATRIC DISORDERS Anxiety Depression    GI/Hepatic negative GI ROS, Neg liver ROS,   Endo/Other  Morbid obesity  Renal/GU   negative genitourinary   Musculoskeletal  (+) Arthritis ,   Abdominal   Peds  Hematology negative hematology ROS (+)   Anesthesia Other Findings   Reproductive/Obstetrics                           Anesthesia Physical Anesthesia Plan  ASA: III  Anesthesia Plan: General   Post-op Pain Management:    Induction: Intravenous  PONV Risk Score and Plan: 2 and Midazolam, Dexamethasone and  Ondansetron  Airway Management Planned: Oral ETT  Additional Equipment:   Intra-op Plan:   Post-operative Plan: Extubation in OR  Informed Consent: I have reviewed the patients History and Physical, chart, labs and discussed the procedure including the risks, benefits and alternatives for the proposed anesthesia with the patient or authorized representative who has indicated his/her understanding and acceptance.     Dental advisory given  Plan Discussed with: CRNA  Anesthesia Plan Comments:      Anesthesia Quick Evaluation

## 2019-06-25 NOTE — Progress Notes (Signed)
Anesthesia Chart Review: She was admitted Feb 2019 with acute hypoxic respiratory failure presumably from opioid overdose.  She also had acute heart failure. Both hypoxia and acute heart failure resolved. Echo 08/25/17 showed EF 78-93%, normal diastolic function.  Preop labs with abnormal UA, results called to Dr. Laurena Bering office.   ALT noted to be mildly elevated at 62. Review of previous labs shows history of mild transaminase elevations.   TTE 08/26/27: - Left ventricle: The cavity size was normal. Wall thickness was   normal. Systolic function was normal. The estimated ejection   fraction was in the range of 50% to 55%. Left ventricular   diastolic function parameters were normal. - Right ventricle: Systolic function was normal. TAPSE: 20.3 mm . - Limited echo to evaluate LV and RV function.    Wynonia Musty [facility] Short Stay Center/Anesthesiology Phone [phone] 06/25/2019 10:33 AM

## 2019-06-26 ENCOUNTER — Ambulatory Visit (HOSPITAL_COMMUNITY): Payer: BC Managed Care – PPO | Admitting: Physician Assistant

## 2019-06-26 ENCOUNTER — Encounter (HOSPITAL_COMMUNITY): Payer: Self-pay | Admitting: Orthopedic Surgery

## 2019-06-26 ENCOUNTER — Other Ambulatory Visit: Payer: Self-pay

## 2019-06-26 ENCOUNTER — Ambulatory Visit (HOSPITAL_COMMUNITY): Payer: BC Managed Care – PPO

## 2019-06-26 ENCOUNTER — Ambulatory Visit (HOSPITAL_COMMUNITY): Payer: BC Managed Care – PPO | Admitting: Anesthesiology

## 2019-06-26 ENCOUNTER — Ambulatory Visit (HOSPITAL_COMMUNITY)
Admission: RE | Admit: 2019-06-26 | Discharge: 2019-06-27 | Disposition: A | Discharge status: Home / Self Care | Payer: BC Managed Care – PPO | Attending: Orthopedic Surgery | Admitting: Orthopedic Surgery

## 2019-06-26 ENCOUNTER — Encounter (HOSPITAL_COMMUNITY)
Admission: RE | Disposition: A | Discharge status: Home / Self Care | Payer: Self-pay | Source: Home / Self Care | Attending: Orthopedic Surgery

## 2019-06-26 DIAGNOSIS — Z6838 Body mass index (BMI) 38.0-38.9, adult: Secondary | ICD-10-CM | POA: Insufficient documentation

## 2019-06-26 DIAGNOSIS — M50122 Cervical disc disorder at C5-C6 level with radiculopathy: Secondary | ICD-10-CM | POA: Insufficient documentation

## 2019-06-26 DIAGNOSIS — M5410 Radiculopathy, site unspecified: Secondary | ICD-10-CM | POA: Diagnosis present

## 2019-06-26 DIAGNOSIS — G4730 Sleep apnea, unspecified: Secondary | ICD-10-CM | POA: Diagnosis not present

## 2019-06-26 DIAGNOSIS — F17210 Nicotine dependence, cigarettes, uncomplicated: Secondary | ICD-10-CM | POA: Diagnosis not present

## 2019-06-26 DIAGNOSIS — Z79899 Other long term (current) drug therapy: Secondary | ICD-10-CM | POA: Diagnosis not present

## 2019-06-26 DIAGNOSIS — Z419 Encounter for procedure for purposes other than remedying health state, unspecified: Secondary | ICD-10-CM

## 2019-06-26 HISTORY — PX: ANTERIOR CERVICAL DECOMP/DISCECTOMY FUSION: SHX1161

## 2019-06-26 LAB — POCT PREGNANCY, URINE: Preg Test, Ur: NEGATIVE

## 2019-06-26 SURGERY — ANTERIOR CERVICAL DECOMPRESSION/DISCECTOMY FUSION 1 LEVEL
Anesthesia: General

## 2019-06-26 MED ORDER — PHENYLEPHRINE 40 MCG/ML (10ML) SYRINGE FOR IV PUSH (FOR BLOOD PRESSURE SUPPORT)
PREFILLED_SYRINGE | INTRAVENOUS | Status: AC
  Filled 2019-06-26: qty 10

## 2019-06-26 MED ORDER — ACETAMINOPHEN 500 MG PO TABS: 1000.0000 mg | ORAL_TABLET | Freq: Once | ORAL | Status: AC

## 2019-06-26 MED ORDER — PANTOPRAZOLE SODIUM 40 MG PO TBEC
40.0000 mg | DELAYED_RELEASE_TABLET | Freq: Every day | ORAL | Status: DC
  Administered 2019-06-26: 40 mg via ORAL
  Filled 2019-06-26: qty 1

## 2019-06-26 MED ORDER — ONDANSETRON HCL 4 MG/2ML IJ SOLN
INTRAMUSCULAR | Status: AC
  Filled 2019-06-26: qty 4

## 2019-06-26 MED ORDER — MIDAZOLAM HCL 2 MG/2ML IJ SOLN
INTRAMUSCULAR | Status: AC
  Filled 2019-06-26: qty 2

## 2019-06-26 MED ORDER — SODIUM CHLORIDE 0.9% FLUSH: 3.0000 mL | Freq: Two times a day (BID) | INTRAVENOUS | Status: DC

## 2019-06-26 MED ORDER — CEFAZOLIN SODIUM-DEXTROSE 2-4 GM/100ML-% IV SOLN: 2.0000 g | INTRAVENOUS | Status: DC

## 2019-06-26 MED ORDER — DEXAMETHASONE SODIUM PHOSPHATE 10 MG/ML IJ SOLN
INTRAMUSCULAR | Status: AC
  Filled 2019-06-26: qty 2

## 2019-06-26 MED ORDER — LIDOCAINE 2% (20 MG/ML) 5 ML SYRINGE
INTRAMUSCULAR | Status: AC
  Filled 2019-06-26: qty 10

## 2019-06-26 MED ORDER — MIDAZOLAM HCL 5 MG/5ML IJ SOLN
INTRAMUSCULAR | Status: DC | PRN
  Administered 2019-06-26 (×2): 1 mg via INTRAVENOUS

## 2019-06-26 MED ORDER — MIDAZOLAM HCL 2 MG/2ML IJ SOLN
2.0000 mg | Freq: Once | INTRAMUSCULAR | Status: AC
  Filled 2019-06-26: qty 2

## 2019-06-26 MED ORDER — MIDAZOLAM HCL 2 MG/2ML IJ SOLN
INTRAMUSCULAR | Status: AC
  Administered 2019-06-26: 2 mg via INTRAVENOUS
  Filled 2019-06-26: qty 2

## 2019-06-26 MED ORDER — SODIUM CHLORIDE 0.9% FLUSH: 3.0000 mL | INTRAVENOUS | Status: DC | PRN

## 2019-06-26 MED ORDER — FENTANYL CITRATE (PF) 100 MCG/2ML IJ SOLN
INTRAMUSCULAR | Status: AC
  Filled 2019-06-26: qty 2

## 2019-06-26 MED ORDER — ROCURONIUM BROMIDE 10 MG/ML (PF) SYRINGE
PREFILLED_SYRINGE | INTRAVENOUS | Status: AC
  Filled 2019-06-26: qty 40

## 2019-06-26 MED ORDER — THROMBIN 20000 UNITS EX SOLR
CUTANEOUS | Status: AC
  Filled 2019-06-26: qty 20000

## 2019-06-26 MED ORDER — FENTANYL CITRATE (PF) 100 MCG/2ML IJ SOLN
INTRAMUSCULAR | Status: DC | PRN
  Administered 2019-06-26: 50 ug via INTRAVENOUS
  Administered 2019-06-26: 100 ug via INTRAVENOUS
  Administered 2019-06-26 (×2): 50 ug via INTRAVENOUS

## 2019-06-26 MED ORDER — DEXMEDETOMIDINE HCL 200 MCG/2ML IV SOLN
INTRAVENOUS | Status: DC | PRN
  Administered 2019-06-26 (×2): 8 ug via INTRAVENOUS

## 2019-06-26 MED ORDER — FENTANYL CITRATE (PF) 250 MCG/5ML IJ SOLN
INTRAMUSCULAR | Status: AC
  Filled 2019-06-26: qty 5

## 2019-06-26 MED ORDER — ACETAMINOPHEN 650 MG RE SUPP: 650.0000 mg | RECTAL | Status: DC | PRN

## 2019-06-26 MED ORDER — THROMBIN 5000 UNITS EX SOLR
OROMUCOSAL | Status: DC | PRN
  Administered 2019-06-26: 5 mL via TOPICAL

## 2019-06-26 MED ORDER — DEXAMETHASONE SODIUM PHOSPHATE 10 MG/ML IJ SOLN
INTRAMUSCULAR | Status: DC | PRN
  Administered 2019-06-26: 10 mg via INTRAVENOUS

## 2019-06-26 MED ORDER — ONDANSETRON HCL 4 MG PO TABS: 4.0000 mg | ORAL_TABLET | Freq: Four times a day (QID) | ORAL | Status: DC | PRN

## 2019-06-26 MED ORDER — ROCURONIUM BROMIDE 100 MG/10ML IV SOLN
INTRAVENOUS | Status: DC | PRN
  Administered 2019-06-26: 100 mg via INTRAVENOUS
  Administered 2019-06-26: 20 mg via INTRAVENOUS

## 2019-06-26 MED ORDER — AMPHETAMINE-DEXTROAMPHETAMINE 10 MG PO TABS
20.0000 mg | ORAL_TABLET | Freq: Two times a day (BID) | ORAL | Status: DC
  Filled 2019-06-26: qty 2

## 2019-06-26 MED ORDER — SUCCINYLCHOLINE CHLORIDE 200 MG/10ML IV SOSY
PREFILLED_SYRINGE | INTRAVENOUS | Status: AC
  Filled 2019-06-26: qty 10

## 2019-06-26 MED ORDER — PHENOL 1.4 % MT LIQD
1.0000 | OROMUCOSAL | Status: DC | PRN
  Administered 2019-06-26: 1 via OROMUCOSAL
  Filled 2019-06-26: qty 177

## 2019-06-26 MED ORDER — LIDOCAINE 2% (20 MG/ML) 5 ML SYRINGE
INTRAMUSCULAR | Status: DC | PRN
  Administered 2019-06-26: 100 mg via INTRAVENOUS

## 2019-06-26 MED ORDER — THROMBIN 20000 UNITS EX SOLR
CUTANEOUS | Status: DC | PRN
  Administered 2019-06-26: 12:00:00 20 mL via TOPICAL

## 2019-06-26 MED ORDER — CEFAZOLIN SODIUM-DEXTROSE 2-4 GM/100ML-% IV SOLN
2.0000 g | Freq: Three times a day (TID) | INTRAVENOUS | Status: AC
  Administered 2019-06-26 – 2019-06-27 (×2): 2 g via INTRAVENOUS
  Filled 2019-06-26 (×2): qty 100

## 2019-06-26 MED ORDER — OXYCODONE-ACETAMINOPHEN 5-325 MG PO TABS
1.0000 | ORAL_TABLET | ORAL | Status: DC | PRN
  Administered 2019-06-26 – 2019-06-27 (×5): 2 via ORAL
  Filled 2019-06-26 (×5): qty 2

## 2019-06-26 MED ORDER — ACETAMINOPHEN 500 MG PO TABS
ORAL_TABLET | ORAL | Status: AC
  Administered 2019-06-26: 1000 mg via ORAL
  Filled 2019-06-26: qty 2

## 2019-06-26 MED ORDER — BISACODYL 5 MG PO TBEC: 5.0000 mg | DELAYED_RELEASE_TABLET | Freq: Every day | ORAL | Status: DC | PRN

## 2019-06-26 MED ORDER — ALUM & MAG HYDROXIDE-SIMETH 200-200-20 MG/5ML PO SUSP: 30.0000 mL | Freq: Four times a day (QID) | ORAL | Status: DC | PRN

## 2019-06-26 MED ORDER — LACTATED RINGERS IV SOLN: INTRAVENOUS | Status: DC | PRN

## 2019-06-26 MED ORDER — PANTOPRAZOLE SODIUM 40 MG IV SOLR: 40.0000 mg | Freq: Every day | INTRAVENOUS | Status: DC

## 2019-06-26 MED ORDER — SODIUM CHLORIDE 0.9 % IV SOLN: 250.0000 mL | INTRAVENOUS | Status: DC

## 2019-06-26 MED ORDER — PROPOFOL 10 MG/ML IV BOLUS
INTRAVENOUS | Status: AC
  Filled 2019-06-26: qty 20

## 2019-06-26 MED ORDER — ZOLPIDEM TARTRATE 5 MG PO TABS: 5.0000 mg | ORAL_TABLET | Freq: Every evening | ORAL | Status: DC | PRN

## 2019-06-26 MED ORDER — ONDANSETRON HCL 4 MG/2ML IJ SOLN
INTRAMUSCULAR | Status: DC | PRN
  Administered 2019-06-26: 4 mg via INTRAVENOUS

## 2019-06-26 MED ORDER — CEFAZOLIN SODIUM-DEXTROSE 2-3 GM-%(50ML) IV SOLR
INTRAVENOUS | Status: DC | PRN
  Administered 2019-06-26: 2 g via INTRAVENOUS

## 2019-06-26 MED ORDER — MENTHOL 3 MG MT LOZG
1.0000 | LOZENGE | OROMUCOSAL | Status: DC | PRN
  Filled 2019-06-26: qty 9

## 2019-06-26 MED ORDER — METHOCARBAMOL 500 MG PO TABS
500.0000 mg | ORAL_TABLET | Freq: Four times a day (QID) | ORAL | Status: DC | PRN
  Administered 2019-06-26 – 2019-06-27 (×3): 500 mg via ORAL
  Filled 2019-06-26 (×2): qty 1

## 2019-06-26 MED ORDER — POVIDONE-IODINE 7.5 % EX SOLN
Freq: Once | CUTANEOUS | Status: DC
  Filled 2019-06-26: qty 118

## 2019-06-26 MED ORDER — BUPIVACAINE-EPINEPHRINE 0.25% -1:200000 IJ SOLN
INTRAMUSCULAR | Status: DC | PRN
  Administered 2019-06-26: 4 mL

## 2019-06-26 MED ORDER — ONDANSETRON HCL 4 MG/2ML IJ SOLN: 4.0000 mg | Freq: Four times a day (QID) | INTRAMUSCULAR | Status: DC | PRN

## 2019-06-26 MED ORDER — SENNOSIDES-DOCUSATE SODIUM 8.6-50 MG PO TABS: 1.0000 | ORAL_TABLET | Freq: Every evening | ORAL | Status: DC | PRN

## 2019-06-26 MED ORDER — PROPOFOL 10 MG/ML IV BOLUS
INTRAVENOUS | Status: DC | PRN
  Administered 2019-06-26: 200 mg via INTRAVENOUS

## 2019-06-26 MED ORDER — EPHEDRINE 5 MG/ML INJ
INTRAVENOUS | Status: AC
  Filled 2019-06-26: qty 10

## 2019-06-26 MED ORDER — THROMBIN 5000 UNITS EX SOLR
CUTANEOUS | Status: AC
  Filled 2019-06-26: qty 5000

## 2019-06-26 MED ORDER — ACETAMINOPHEN 325 MG PO TABS: 650.0000 mg | ORAL_TABLET | ORAL | Status: DC | PRN

## 2019-06-26 MED ORDER — FLEET ENEMA 7-19 GM/118ML RE ENEM: 1.0000 | ENEMA | Freq: Once | RECTAL | Status: DC | PRN

## 2019-06-26 MED ORDER — LACTATED RINGERS IV SOLN: INTRAVENOUS | Status: DC

## 2019-06-26 MED ORDER — SUGAMMADEX SODIUM 500 MG/5ML IV SOLN
INTRAVENOUS | Status: DC | PRN
  Administered 2019-06-26: 450 mg via INTRAVENOUS

## 2019-06-26 MED ORDER — FENTANYL CITRATE (PF) 100 MCG/2ML IJ SOLN
25.0000 ug | INTRAMUSCULAR | Status: DC | PRN
  Administered 2019-06-26 (×2): 50 ug via INTRAVENOUS

## 2019-06-26 MED ORDER — CEFAZOLIN SODIUM-DEXTROSE 2-4 GM/100ML-% IV SOLN
INTRAVENOUS | Status: AC
  Filled 2019-06-26: qty 100

## 2019-06-26 MED ORDER — METHOCARBAMOL 500 MG PO TABS
ORAL_TABLET | ORAL | Status: AC
  Filled 2019-06-26: qty 1

## 2019-06-26 MED ORDER — PHENYLEPHRINE HCL-NACL 10-0.9 MG/250ML-% IV SOLN
INTRAVENOUS | Status: DC | PRN
  Administered 2019-06-26: 30 ug/min via INTRAVENOUS

## 2019-06-26 MED ORDER — METHOCARBAMOL 1000 MG/10ML IJ SOLN
500.0000 mg | Freq: Four times a day (QID) | INTRAVENOUS | Status: DC | PRN
  Filled 2019-06-26: qty 5

## 2019-06-26 SURGICAL SUPPLY — 79 items
BENZOIN TINCTURE PRP APPL 2/3 (GAUZE/BANDAGES/DRESSINGS) ×3 IMPLANT
BIT DRILL NEURO 2X3.1 SFT TUCH (MISCELLANEOUS) ×1 IMPLANT
BIT DRILL SRG 14X2.2XFLT CHK (BIT) IMPLANT
BIT DRL SRG 14X2.2XFLT CHK (BIT) ×1
BLADE CLIPPER SURG (BLADE) ×3 IMPLANT
BLADE SURG 15 STRL LF DISP TIS (BLADE) ×1 IMPLANT
BLADE SURG 15 STRL SS (BLADE) ×2
BONE VIVIGEN FORMABLE 1.3CC (Bone Implant) ×3 IMPLANT
BUR MATCHSTICK NEURO 3.0 LAGG (BURR) IMPLANT
CARTRIDGE OIL MAESTRO DRILL (MISCELLANEOUS) ×1 IMPLANT
CLOSURE WOUND 1/2 X4 (GAUZE/BANDAGES/DRESSINGS) ×1
COLLAR CERV LO CONTOUR FIRM DE (SOFTGOODS) ×2 IMPLANT
CORD BIPOLAR FORCEPS 12FT (ELECTRODE) ×3 IMPLANT
COVER SURGICAL LIGHT HANDLE (MISCELLANEOUS) ×1 IMPLANT
COVER WAND RF STERILE (DRAPES) ×1 IMPLANT
DIFFUSER DRILL AIR PNEUMATIC (MISCELLANEOUS) ×3 IMPLANT
DRAIN JACKSON RD 7FR 3/32 (WOUND CARE) IMPLANT
DRAPE C-ARM 42X72 X-RAY (DRAPES) ×3 IMPLANT
DRAPE POUCH INSTRU U-SHP 10X18 (DRAPES) ×3 IMPLANT
DRAPE SURG 17X23 STRL (DRAPES) ×9 IMPLANT
DRILL BIT SKYLINE 14MM (BIT) ×2
DRILL NEURO 2X3.1 SOFT TOUCH (MISCELLANEOUS) ×3
DURAPREP 26ML APPLICATOR (WOUND CARE) ×3 IMPLANT
ELECT COATED BLADE 2.86 ST (ELECTRODE) ×3 IMPLANT
ELECT REM PT RETURN 9FT ADLT (ELECTROSURGICAL) ×3
ELECTRODE REM PT RTRN 9FT ADLT (ELECTROSURGICAL) ×1 IMPLANT
EVACUATOR SILICONE 100CC (DRAIN) IMPLANT
GAUZE 4X4 16PLY RFD (DISPOSABLE) ×3 IMPLANT
GAUZE SPONGE 4X4 12PLY STRL (GAUZE/BANDAGES/DRESSINGS) ×3 IMPLANT
GLOVE BIO SURGEON STRL SZ7 (GLOVE) ×3 IMPLANT
GLOVE BIO SURGEON STRL SZ8 (GLOVE) ×3 IMPLANT
GLOVE BIOGEL PI IND STRL 7.0 (GLOVE) ×2 IMPLANT
GLOVE BIOGEL PI IND STRL 8 (GLOVE) ×1 IMPLANT
GLOVE BIOGEL PI INDICATOR 7.0 (GLOVE) ×4
GLOVE BIOGEL PI INDICATOR 8 (GLOVE) ×2
GOWN STRL REUS W/ TWL LRG LVL3 (GOWN DISPOSABLE) ×1 IMPLANT
GOWN STRL REUS W/ TWL XL LVL3 (GOWN DISPOSABLE) ×1 IMPLANT
GOWN STRL REUS W/TWL LRG LVL3 (GOWN DISPOSABLE) ×2
GOWN STRL REUS W/TWL XL LVL3 (GOWN DISPOSABLE) ×2
GRAFT BNE MATRIX VG FRMBL SM 1 (Bone Implant) IMPLANT
HEMOSTAT POWDER KIT SURGIFOAM (HEMOSTASIS) ×2 IMPLANT
INTERLOCK LRDTC CRVCL VBR 7MM (Bone Implant) IMPLANT
IV CATH 14GX2 1/4 (CATHETERS) ×3 IMPLANT
KIT BASIN OR (CUSTOM PROCEDURE TRAY) ×3 IMPLANT
KIT TURNOVER KIT B (KITS) ×3 IMPLANT
LORDOTIC CERVICAL VBR 7MM SM (Bone Implant) ×3 IMPLANT
MANIFOLD NEPTUNE II (INSTRUMENTS) ×1 IMPLANT
NDL PRECISIONGLIDE 27X1.5 (NEEDLE) ×1 IMPLANT
NDL SPNL 20GX3.5 QUINCKE YW (NEEDLE) ×1 IMPLANT
NEEDLE PRECISIONGLIDE 27X1.5 (NEEDLE) ×3 IMPLANT
NEEDLE SPNL 20GX3.5 QUINCKE YW (NEEDLE) ×3 IMPLANT
NS IRRIG 1000ML POUR BTL (IV SOLUTION) ×5 IMPLANT
OIL CARTRIDGE MAESTRO DRILL (MISCELLANEOUS) ×3
PACK ORTHO CERVICAL (CUSTOM PROCEDURE TRAY) ×3 IMPLANT
PAD ARMBOARD 7.5X6 YLW CONV (MISCELLANEOUS) ×6 IMPLANT
PATTIES SURGICAL .5 X.5 (GAUZE/BANDAGES/DRESSINGS) IMPLANT
PATTIES SURGICAL .5 X1 (DISPOSABLE) IMPLANT
PIN DISTRACTION 14 (PIN) ×4 IMPLANT
PLATE SKYLINE 12MM (Plate) ×2 IMPLANT
POSITIONER HEAD DONUT 9IN (MISCELLANEOUS) ×3 IMPLANT
SCREW SKYLINE VAR OS 14MM (Screw) ×6 IMPLANT
SCREW VAR SELF TAP SKYLINE 14M (Screw) ×8 IMPLANT
SPONGE INTESTINAL PEANUT (DISPOSABLE) ×5 IMPLANT
SPONGE SURGIFOAM ABS GEL 100 (HEMOSTASIS) IMPLANT
STRIP CLOSURE SKIN 1/2X4 (GAUZE/BANDAGES/DRESSINGS) ×2 IMPLANT
SURGIFLO W/THROMBIN 8M KIT (HEMOSTASIS) IMPLANT
SUT MNCRL AB 4-0 PS2 18 (SUTURE) IMPLANT
SUT SILK 4 0 (SUTURE)
SUT SILK 4-0 18XBRD TIE 12 (SUTURE) IMPLANT
SUT VIC AB 2-0 CT2 18 VCP726D (SUTURE) ×3 IMPLANT
SYR BULB IRRIGATION 50ML (SYRINGE) ×3 IMPLANT
SYR CONTROL 10ML LL (SYRINGE) ×6 IMPLANT
TAPE CLOTH 4X10 WHT NS (GAUZE/BANDAGES/DRESSINGS) ×3 IMPLANT
TAPE CLOTH SURG 4X10 WHT LF (GAUZE/BANDAGES/DRESSINGS) ×2 IMPLANT
TAPE UMBILICAL COTTON 1/8X30 (MISCELLANEOUS) ×3 IMPLANT
TOWEL GREEN STERILE (TOWEL DISPOSABLE) ×3 IMPLANT
TOWEL GREEN STERILE FF (TOWEL DISPOSABLE) ×3 IMPLANT
WATER STERILE IRR 1000ML POUR (IV SOLUTION) ×3 IMPLANT
YANKAUER SUCT BULB TIP NO VENT (SUCTIONS) ×3 IMPLANT

## 2019-06-26 NOTE — H&P (Signed)
PREOPERATIVE H&P  Chief Complaint: Right arm pain  HPI: Brooke Espinoza is a 33 y.o. female who presents with ongoing pain in the right arm  MRI reveals a right C5/6 disc herniation. An EMG/NCS does conform right C6 radic.  Patient has failed multiple forms of conservative care and continues to have pain (see office notes for additional details regarding the patient's full course of treatment)  Past Medical History:  Diagnosis Date  . Anxiety   . Arthritis   . Asthma    as a child  . Depression   . Gestational diabetes    no meds,gest only  . Headache   . Recurrent UTI   . Renal disorder   . Sleep apnea    not using cpap   Past Surgical History:  Procedure Laterality Date  . CHOLECYSTECTOMY    . ELBOW SURGERY Left    no surgery,cracked it  . LAPAROSCOPIC BILATERAL SALPINGECTOMY Bilateral 01/01/2013   Procedure: LAPAROSCOPIC BILATERAL SALPINGECTOMY;  Surgeon: Tilda Burrow, MD;  Location: [facility];  Service: Gynecology;  Laterality: Bilateral;  . TUBAL LIGATION     Social History   Socioeconomic History  . Marital status: Married    Spouse name: Not on file  . Number of children: Not on file  . Years of education: Not on file  . Highest education level: Not on file  Occupational History  . Not on file  Tobacco Use  . Smoking status: Current Every Day Smoker    Packs/day: 1.00    Years: 3.00    Pack years: 3.00    Types: Cigarettes    Last attempt to quit: 04/01/2015    Years since quitting: 4.2  . Smokeless tobacco: Never Used  Substance and Sexual Activity  . Alcohol use: Yes    Alcohol/week: 1.0 standard drinks    Types: 1 Glasses of wine per week    Comment: socially per patient report  . Drug use: No  . Sexual activity: Yes    Birth control/protection: Surgical  Other Topics Concern  . Not on file  Social History Narrative  . Not on file   Social Determinants of Health   Financial Resource Strain:   . Difficulty of Paying Living  Expenses: Not on file  Food Insecurity:   . Worried About Programme researcher, broadcasting/film/video in the Last Year: Not on file  . Ran Out of Food in the Last Year: Not on file  Transportation Needs:   . Lack of Transportation (Medical): Not on file  . Lack of Transportation (Non-Medical): Not on file  Physical Activity:   . Days of Exercise per Week: Not on file  . Minutes of Exercise per Session: Not on file  Stress:   . Feeling of Stress : Not on file  Social Connections:   . Frequency of Communication with Friends and Family: Not on file  . Frequency of Social Gatherings with Friends and Family: Not on file  . Attends Religious Services: Not on file  . Active Member of Clubs or Organizations: Not on file  . Attends Banker Meetings: Not on file  . Marital Status: Not on file   Family History  Problem Relation Age of Onset  . Hypertension Mother   . Mental illness Mother        no specific diagnosis referred to as a chemical imbalance  . Depression Mother   . Hypertension Father   . Thyroid disease Father   .  Heart disease Father   . Depression Father   . Anxiety disorder Father   . Heart disease Maternal Grandmother   . Diabetes Maternal Grandmother   . Hypertension Maternal Grandmother   . Dementia Maternal Grandfather    No Known Allergies Prior to Admission medications   Medication Sig Start Date End Date Taking? Authorizing Provider  HYDROcodone-acetaminophen (NORCO/VICODIN) 5-325 MG tablet Take 1 tablet by mouth every 6 (six) hours as needed for moderate pain.  06/17/19  Yes [provider]  amphetamine-dextroamphetamine (ADDERALL) 20 MG tablet Take 1 tablet by mouth 2 (two) times daily. 11/06/15 08/25/17  [provider]  benzonatate (TESSALON) 100 MG capsule Take 1-2 capsules (100-200 mg total) by mouth 3 (three) times daily as needed for cough. Patient not taking: Reported on 06/17/2019 08/24/17   Murlean Iba, MD     All other systems have been  reviewed and were otherwise negative with the exception of those mentioned in the HPI and as above.  Physical Exam: There were no vitals filed for this visit.  There is no height or weight on file to calculate BMI.  General: Alert, no acute distress Cardiovascular: No pedal edema Respiratory: No cyanosis, no use of accessory musculature Skin: No lesions in the area of chief complaint Neurologic: Sensation intact distally Psychiatric: Patient is competent for consent with normal mood and affect Lymphatic: No axillary or cervical lymphadenopathy  MUSCULOSKELETAL: + spurling's sign on the right  Assessment/Plan: RIGHT C6 RADICULOPATHY Plan for Procedure(s): ANTERIOR CERVICAL DECOMPRESSION FUSION CERVICAL 5-6 WITH INSTRUMENTATION AND ALLOGRAFT   Norva Karvonen, MD 06/26/2019 7:01 AM

## 2019-06-26 NOTE — Op Note (Signed)
PATIENT NAME: Brooke Espinoza   MEDICAL RECORD NO.:   [id number]    DATE OF BIRTH: 25-Sep-1985  DATE OF PROCEDURE: 06/26/2019                               OPERATIVE REPORT     PREOPERATIVE DIAGNOSES: 1. Right-sided cervical radiculopathy. 2. Right C5/6 disc herniation compressing the right C6 nerve   POSTOPERATIVE DIAGNOSES: 1. Right-sided cervical radiculopathy. 2. Right C5/6 disc herniation compressing the right C6 nerve   PROCEDURE: 1. Anterior cervical decompression and fusion C5/6 2. Placement of anterior instrumentation, C5/6 3. Insertion of interbody device x1 (28mm Titan intervertebral spacer). 4. Intraoperative use of fluoroscopy. 5. Use of morselized allograft - ViviGen.   SURGEON:  Phylliss Bob, MD   ASSISTANT:  Pricilla Holm, PA-C.   ANESTHESIA:  General endotracheal anesthesia.   COMPLICATIONS:  None.   DISPOSITION:  Stable.   ESTIMATED BLOOD LOSS:  Minimal.   INDICATIONS FOR SURGERY:  Briefly, Ms. Sambrano is a pleasant 33 -year- old female, who did present to me with severe pain in his neck and right arm.   The patient's MRI did reveal the findings noted above (although the radiologist made no mention of this finding).  Given her ongoing rather debilitating pain and lack of improvement with appropriate treatment measures, we did discuss proceeding with the procedure noted above.  The patient was fully aware of the risks and limitations of surgery as outlined in my preoperative note.   OPERATIVE DETAILS:  On 06/26/2019 , the patient was brought to surgery and general endotracheal anesthesia was administered.  The patient was placed supine on the hospital bed. The neck was gently extended.  All bony prominences were meticulously padded.  The neck was prepped and draped in the usual sterile fashion.  At this point, I did make a left-sided transverse incision.  The platysma was incised.  A Smith-Robinson approach was used and the anterior spine was  identified. A self-retaining retractor was placed.  I then subperiosteally exposed the vertebral bodies from C5-C6.  Caspar pins were then placed into the C5 and C6 vertebral bodies and distraction was applied.  A thorough and complete C5-6 intervertebral diskectomy was performed.  The posterior longitudinal ligament was identified and entered using a nerve hook.  I then used #1 followed by #2 Kerrison to perform a thorough and complete intervertebral diskectomy. The spinal canal was thoroughly decompressed, as was the right neuroforamen. The endplates were then prepared and the appropriate-sized intervertebral spacer was then packed with ViviGen and tamped into position in the usual fashion. The Caspar pins  then were removed and bone wax was placed in their place. The appropriate-sized anterior cervical plate was placed over the anterior spine. 14 mm variable angle screws were placed, 2 in each vertebral body from C5-C6 for a total of 4 vertebral body screws.  The screws were then locked to the plate using the Cam locking mechanism.  I was very pleased with the final fluoroscopic images.  The wound was then irrigated.  The wound was then explored for any undue bleeding and there was no bleeding noted. The wound was then closed in layers using 2-0 Vicryl, followed by 4-0 Monocryl.  Benzoin and Steri-Strips were applied, followed by sterile dressing.  All instrument counts were correct at the termination of the procedure.   Of note, Pricilla Holm, PA-C, was my assistant throughout surgery, and did aid in retraction,  suctioning, and closure from start to finish.    Estill Bamberg, MD

## 2019-06-26 NOTE — Transfer of Care (Signed)
Immediate Anesthesia Transfer of Care Note  Patient: Brooke Espinoza  Procedure(s) Performed: ANTERIOR CERVICAL DECOMPRESSION FUSION CERVICAL FIVE-SIX WITH INSTRUMENTATION AND ALLOGRAFT (N/A )  Patient Location: PACU  Anesthesia Type:General  Level of Consciousness: awake, alert  and oriented  Airway & Oxygen Therapy: Patient Spontanous Breathing and Patient connected to nasal cannula oxygen  Post-op Assessment: Report given to RN, Post -op Vital signs reviewed and stable and Patient moving all extremities X 4  Post vital signs: Reviewed and stable  Last Vitals:  Vitals Value Taken Time  BP    Temp    Pulse 63 06/26/19 1311  Resp 13 06/26/19 1311  SpO2 100 % 06/26/19 1311  Vitals shown include unvalidated device data.  Last Pain:  Vitals:   06/26/19 0943  TempSrc:   PainSc: 7       Patients Stated Pain Goal: 3 (76/72/09 4709)  Complications: No apparent anesthesia complications

## 2019-06-26 NOTE — Anesthesia Procedure Notes (Signed)
Procedure Name: Intubation Date/Time: 06/26/2019 11:16 AM Performed by: Neldon Newport, CRNA Pre-anesthesia Checklist: Timeout performed, Patient being monitored, Suction available, Emergency Drugs available and Patient identified Patient Re-evaluated:Patient Re-evaluated prior to induction Oxygen Delivery Method: Circle system utilized Preoxygenation: Pre-oxygenation with 100% oxygen Induction Type: IV induction Ventilation: Oral airway inserted - appropriate to patient size and Mask ventilation without difficulty Laryngoscope Size: Glidescope and 3 Grade View: Grade I Tube type: Oral Tube size: 7.0 mm Number of attempts: 1 Placement Confirmation: ETT inserted through vocal cords under direct vision,  positive ETCO2 and breath sounds checked- equal and bilateral Secured at: 21 cm Tube secured with: Tape Dental Injury: Teeth and Oropharynx as per pre-operative assessment

## 2019-06-27 DIAGNOSIS — M50122 Cervical disc disorder at C5-C6 level with radiculopathy: Secondary | ICD-10-CM | POA: Diagnosis not present

## 2019-06-27 MED ORDER — OXYCODONE-ACETAMINOPHEN 5-325 MG PO TABS: 1.0000 | ORAL_TABLET | ORAL | 0 refills | Status: DC | PRN

## 2019-06-27 MED ORDER — METHOCARBAMOL 500 MG PO TABS: 500.0000 mg | ORAL_TABLET | Freq: Four times a day (QID) | ORAL | 2 refills | Status: DC | PRN

## 2019-06-27 NOTE — Progress Notes (Signed)
    Patient doing well  Reports resolution of preoperative right arm pain Tolerating PO well   Physical Exam: Vitals:   06/26/19 2308 06/27/19 0407  BP: 135/80 (!) 143/97  Pulse: 99 90  Resp: 20 20  Temp: 98.5 F (36.9 C) 97.9 F (36.6 C)  SpO2: 95% 95%    Neck soft/supple Dressing in place NVI  POD #1 s/p ACDF, doing well  - encourage ambulation - Percocet for pain, Robaxin for muscle spasms - d/c home today with f/u in 2 weeks

## 2019-06-27 NOTE — Anesthesia Postprocedure Evaluation (Signed)
Anesthesia Post Note  Patient: Brooke Espinoza  Procedure(s) Performed: ANTERIOR CERVICAL DECOMPRESSION FUSION CERVICAL FIVE-SIX WITH INSTRUMENTATION AND ALLOGRAFT (N/A )     Patient location during evaluation: PACU Anesthesia Type: General Level of consciousness: awake and alert Pain management: pain level controlled Vital Signs Assessment: post-procedure vital signs reviewed and stable Respiratory status: spontaneous breathing, nonlabored ventilation, respiratory function stable and patient connected to nasal cannula oxygen Cardiovascular status: blood pressure returned to baseline and stable Postop Assessment: no apparent nausea or vomiting Anesthetic complications: no    Last Vitals:  Vitals:   06/27/19 0407 06/27/19 0747  BP: (!) 143/97 126/86  Pulse: 90 81  Resp: 20 18  Temp: 36.6 C 36.6 C  SpO2: 95% 96%    Last Pain:  Vitals:   06/27/19 0750  TempSrc:   PainSc: 8    Pain Goal: Patients Stated Pain Goal: 3 (06/27/19 0329)                 Haywood Lasso L Faren Florence

## 2019-06-27 NOTE — Progress Notes (Signed)
Patient is discharged from room 3C06 at this time. Alert and in stable condition. IV site d/c'd and instructions read to patient with understanding verbalized. Left unit via wheelchair with all belongings at side.  

## 2019-06-28 ENCOUNTER — Encounter: Payer: Self-pay | Admitting: *Deleted

## 2019-09-09 ENCOUNTER — Encounter: Payer: Self-pay | Admitting: Adult Health

## 2019-09-09 ENCOUNTER — Ambulatory Visit (INDEPENDENT_AMBULATORY_CARE_PROVIDER_SITE_OTHER): Payer: BC Managed Care – PPO | Admitting: Adult Health

## 2019-09-09 ENCOUNTER — Other Ambulatory Visit: Payer: Self-pay

## 2019-09-09 VITALS — BP 136/64 | HR 88 | Ht 67.0 in | Wt 243.0 lb

## 2019-09-09 DIAGNOSIS — L680 Hirsutism: Secondary | ICD-10-CM | POA: Insufficient documentation

## 2019-09-09 DIAGNOSIS — Z3202 Encounter for pregnancy test, result negative: Secondary | ICD-10-CM | POA: Diagnosis not present

## 2019-09-09 DIAGNOSIS — N911 Secondary amenorrhea: Secondary | ICD-10-CM | POA: Diagnosis not present

## 2019-09-09 DIAGNOSIS — E8881 Metabolic syndrome: Secondary | ICD-10-CM

## 2019-09-09 DIAGNOSIS — Q169 Congenital malformation of ear causing impairment of hearing, unspecified: Secondary | ICD-10-CM | POA: Insufficient documentation

## 2019-09-09 DIAGNOSIS — Q8789 Other specified congenital malformation syndromes, not elsewhere classified: Secondary | ICD-10-CM | POA: Insufficient documentation

## 2019-09-09 DIAGNOSIS — Z8742 Personal history of other diseases of the female genital tract: Secondary | ICD-10-CM | POA: Insufficient documentation

## 2019-09-09 DIAGNOSIS — H902 Conductive hearing loss, unspecified: Secondary | ICD-10-CM | POA: Insufficient documentation

## 2019-09-09 LAB — POCT URINE PREGNANCY: Preg Test, Ur: NEGATIVE

## 2019-09-09 MED ORDER — MEDROXYPROGESTERONE ACETATE 10 MG PO TABS: 10.0000 mg | ORAL_TABLET | Freq: Every day | ORAL | 0 refills | Status: DC

## 2019-09-09 NOTE — Progress Notes (Signed)
  Subjective:     Patient ID: Brooke Espinoza, female   DOB: Oct 20, 1985, 34 y.o.   MRN: [id number]  HPI Brooke Espinoza is a 34 year old white female,married, [id number] in complaining of PCO, no period in 6 months.Had labs with PCP, TSH and free T4 normal, A1c pending and ALT elevated at 51. She is sp BTL.  PCP is Marin Olp, NP   Review of Systems No period in 6 months Skin darkened under arms and neck  Hx PCO  Can't lose weight +facial hair  Reviewed past medical,surgical, social and family history. Reviewed medications and allergies.     Objective:   Physical Exam BP 136/64 (BP Location: Left Arm, Patient Position: Sitting, Cuff Size: Large)   Pulse 88   Ht 5\' 7"  (1.702 m)   Wt 243 lb (110.2 kg)   BMI 38.06 kg/m   UPT is negative. Skin warm and dry. Neck:+acanthosis nigricans, and under arms.  Lungs: clear to ausculation bilaterally. Cardiovascular: regular rate and rhythm. Fall risk is low PHQ 9 score is 13, denies being suicidal.    Assessment:     1. Amenorrhea, secondary Will rx provera 10 mg for 10 days to get withdrawal bleed Meds ordered this encounter  Medications  . medroxyPROGESTERone (PROVERA) 10 MG tablet    Sig: Take 1 tablet (10 mg total) by mouth daily.    Dispense:  10 tablet    Refill:  0    Order Specific Question:   Supervising Provider    Answer:   , LUTHER H [[id number]]    2. History of PCOS  3. Metabolic syndrome  4. Hirsutism  5. Pregnancy examination or test, negative result     Plan:     Follow up with me 3/17 to assess bleeding, may need more labs and 4/17

## 2019-09-17 ENCOUNTER — Other Ambulatory Visit: Payer: Self-pay | Admitting: Adult Health

## 2019-09-25 ENCOUNTER — Encounter: Payer: Self-pay | Admitting: Adult Health

## 2019-09-25 ENCOUNTER — Other Ambulatory Visit: Payer: Self-pay

## 2019-09-25 ENCOUNTER — Ambulatory Visit (INDEPENDENT_AMBULATORY_CARE_PROVIDER_SITE_OTHER): Payer: BC Managed Care – PPO | Admitting: Adult Health

## 2019-09-25 VITALS — BP 140/93 | HR 88 | Ht 67.0 in | Wt 244.4 lb

## 2019-09-25 DIAGNOSIS — L680 Hirsutism: Secondary | ICD-10-CM | POA: Diagnosis not present

## 2019-09-25 DIAGNOSIS — N911 Secondary amenorrhea: Secondary | ICD-10-CM

## 2019-09-25 DIAGNOSIS — Z8742 Personal history of other diseases of the female genital tract: Secondary | ICD-10-CM

## 2019-09-25 DIAGNOSIS — R030 Elevated blood-pressure reading, without diagnosis of hypertension: Secondary | ICD-10-CM | POA: Diagnosis not present

## 2019-09-25 MED ORDER — SPIRONOLACTONE 100 MG PO TABS: 100.0000 mg | ORAL_TABLET | Freq: Every day | ORAL | 3 refills | Status: DC

## 2019-09-25 NOTE — Progress Notes (Signed)
  Subjective:     Patient ID: Brooke Espinoza, female   DOB: March 09, 1986, 34 y.o.   MRN: [id number]  HPI Brooke Espinoza is a 34 year old white female,married, [id number], back in follow up on taking provera to get withdrawal bleed, had not had a period in 6 months. And did not get any bleeding after provera and husband her to tell me decreased libido too.  PCP is Marin Olp NP  Review of Systems Did not have bleeding after provera Decreased libido Hair on chin  Reviewed past medical,surgical, social and family history. Reviewed medications and allergies.     Objective:   Physical Exam BP (!) 140/93 (BP Location: Left Arm, Patient Position: Sitting, Cuff Size: Normal)   Pulse 88   Ht 5\' 7"  (1.702 m)   Wt 244 lb 6.4 oz (110.9 kg)   BMI 38.28 kg/m   Skin warm and dry.  Lungs: clear to ausculation bilaterally. Cardiovascular: regular rate and rhythm.   Fall risk is low PHQ 2 score is 0.  Assessment:     1. Amenorrhea, secondary Check FSH, LH and estradiol  2. History of PCOS GYN in 2 weeks Check A1c   3. Hirsutism Will rx spirolactone  Meds ordered this encounter  Medications  . spironolactone (ALDACTONE) 100 MG tablet    Sig: Take 1 tablet (100 mg total) by mouth daily.    Dispense:  30 tablet    Refill:  3    Order Specific Question:   Supervising Provider    Answer:   [place] H [[id number]]  Check CMP 4. Elevated BP without diagnosis of hypertension Will rx spirolactone recheck BP in 2 weeks     Plan:     Return in 2 weeks for GYN Brooke Espinoza and see me

## 2019-09-26 LAB — COMPREHENSIVE METABOLIC PANEL
ALT: 42 IU/L — ABNORMAL HIGH (ref 0–32)
AST: 22 IU/L (ref 0–40)
Albumin/Globulin Ratio: 1.3 (ref 1.2–2.2)
Albumin: 4.3 g/dL (ref 3.8–4.8)
Alkaline Phosphatase: 90 IU/L (ref 39–117)
BUN/Creatinine Ratio: 22 (ref 9–23)
BUN: 16 mg/dL (ref 6–20)
Bilirubin Total: 0.4 mg/dL (ref 0.0–1.2)
CO2: 24 mmol/L (ref 20–29)
Calcium: 8.9 mg/dL (ref 8.7–10.2)
Chloride: 103 mmol/L (ref 96–106)
Creatinine, Ser: 0.73 mg/dL (ref 0.57–1.00)
GFR calc Af Amer: 125 mL/min/{1.73_m2} (ref >59)
GFR calc non Af Amer: 109 mL/min/{1.73_m2} (ref >59)
Globulin, Total: 3.2 g/dL (ref 1.5–4.5)
Glucose: 99 mg/dL (ref 65–99)
Potassium: 4.9 mmol/L (ref 3.5–5.2)
Sodium: 141 mmol/L (ref 134–144)
Total Protein: 7.5 g/dL (ref 6.0–8.5)

## 2019-09-26 LAB — HEMOGLOBIN A1C
Est. average glucose Bld gHb Est-mCnc: 117 mg/dL
Hgb A1c MFr Bld: 5.7 % — ABNORMAL HIGH (ref 4.8–5.6)

## 2019-09-26 LAB — ESTRADIOL: Estradiol: 52.6 pg/mL

## 2019-09-26 LAB — LUTEINIZING HORMONE: LH: 6.5 m[IU]/mL

## 2019-09-26 LAB — FOLLICLE STIMULATING HORMONE: FSH: 5 m[IU]/mL

## 2019-10-09 ENCOUNTER — Ambulatory Visit (INDEPENDENT_AMBULATORY_CARE_PROVIDER_SITE_OTHER): Payer: BC Managed Care – PPO

## 2019-10-09 ENCOUNTER — Encounter: Payer: Self-pay | Admitting: Adult Health

## 2019-10-09 ENCOUNTER — Ambulatory Visit (INDEPENDENT_AMBULATORY_CARE_PROVIDER_SITE_OTHER): Payer: BC Managed Care – PPO | Admitting: Adult Health

## 2019-10-09 ENCOUNTER — Other Ambulatory Visit: Payer: Self-pay

## 2019-10-09 VITALS — BP 131/82 | HR 102 | Ht 67.0 in | Wt 242.0 lb

## 2019-10-09 DIAGNOSIS — Z0130 Encounter for examination of blood pressure without abnormal findings: Secondary | ICD-10-CM

## 2019-10-09 DIAGNOSIS — N911 Secondary amenorrhea: Secondary | ICD-10-CM | POA: Diagnosis not present

## 2019-10-09 DIAGNOSIS — Z8742 Personal history of other diseases of the female genital tract: Secondary | ICD-10-CM

## 2019-10-09 DIAGNOSIS — E8881 Metabolic syndrome: Secondary | ICD-10-CM

## 2019-10-09 DIAGNOSIS — L680 Hirsutism: Secondary | ICD-10-CM

## 2019-10-09 DIAGNOSIS — N926 Irregular menstruation, unspecified: Secondary | ICD-10-CM | POA: Insufficient documentation

## 2019-10-09 NOTE — Progress Notes (Signed)
PELVIC US TA/TV: homogeneous anteverted uterus,wnl,EEC 8.7 mm,normal ovaries,ovaries appear mobile,no free fluid,no pain during ultrasound

## 2019-10-09 NOTE — Progress Notes (Signed)
  Subjective:     Patient ID: Brooke Espinoza, female   DOB: 10/28/1985, 34 y.o.   MRN: [id number]  HPI Brooke Espinoza is a 34 year old white female,married, [id number], in to discuss [place] and recheck BP. She did have a period 3/19 for 5 days, first one in over 6 months.  PCP is Marin Olp NP.   Review of Systems Had period 3/19 to 10/02/19, first on in over 6 months  Still decreased libido(try to increase frequency of sex)  Reviewed past medical,surgical, social and family history. Reviewed medications and allergies.     Objective:   Physical Exam BP 131/82 (BP Location: Left Arm, Patient Position: Sitting, Cuff Size: Large)   Pulse (!) 102   Ht 5\' 7"  (1.702 m)   Wt 242 lb (109.8 kg)   LMP 09/27/2019   BMI 37.90 kg/m  Skin warm and dry.  Lungs: clear to ausculation bilaterally. Cardiovascular: regular rate and rhythm.   BP is better, PHQ 9 score is 7 is on lexapro.  09/29/2019 showed normal uterus and ovaries, no PCO appearance. and EEC 8.7 mm,  Assessment:     1. Hirsutism Continue spironolactone   2. Irregular periods Keep period log, would not want to skip period for over 3 months   3. Metabolic syndrome Increase water Decrease carbs  Increase activity     Plan:     Follow up in 8 weeks

## 2019-12-06 ENCOUNTER — Ambulatory Visit: Payer: BC Managed Care – PPO | Admitting: Adult Health

## 2019-12-11 ENCOUNTER — Ambulatory Visit: Payer: Self-pay | Admitting: Adult Health

## 2020-02-19 ENCOUNTER — Other Ambulatory Visit: Payer: Self-pay | Admitting: Adult Health
